# Patient Record
Sex: Male | Born: 1935 | Race: White | Hispanic: No | Marital: Married | Smoking: Former smoker
Health system: Southern US, Community
[De-identification: ages and names within clinical notes are randomized; demographics above are authoritative.]

## PROBLEM LIST (undated history)

## (undated) DIAGNOSIS — I480 Paroxysmal atrial fibrillation: Secondary | ICD-10-CM

## (undated) DIAGNOSIS — I4510 Unspecified right bundle-branch block: Secondary | ICD-10-CM

## (undated) DIAGNOSIS — Z7901 Long term (current) use of anticoagulants: Secondary | ICD-10-CM

## (undated) DIAGNOSIS — I2510 Atherosclerotic heart disease of native coronary artery without angina pectoris: Secondary | ICD-10-CM

## (undated) DIAGNOSIS — E785 Hyperlipidemia, unspecified: Secondary | ICD-10-CM

## (undated) DIAGNOSIS — N183 Chronic kidney disease, stage 3 unspecified: Secondary | ICD-10-CM

## (undated) DIAGNOSIS — B029 Zoster without complications: Secondary | ICD-10-CM

## (undated) DIAGNOSIS — I10 Essential (primary) hypertension: Secondary | ICD-10-CM

## (undated) DIAGNOSIS — E039 Hypothyroidism, unspecified: Secondary | ICD-10-CM

## (undated) DIAGNOSIS — I739 Peripheral vascular disease, unspecified: Secondary | ICD-10-CM

## (undated) HISTORY — DX: Atherosclerotic heart disease of native coronary artery without angina pectoris: I25.10

## (undated) HISTORY — DX: Hyperlipidemia, unspecified: E78.5

## (undated) HISTORY — DX: Hypothyroidism, unspecified: E03.9

## (undated) HISTORY — DX: Chronic kidney disease, stage 3 (moderate): N18.3

## (undated) HISTORY — DX: Essential (primary) hypertension: I10

## (undated) HISTORY — DX: Chronic kidney disease, stage 3 unspecified: N18.30

## (undated) HISTORY — DX: Zoster without complications: B02.9

## (undated) HISTORY — DX: Peripheral vascular disease, unspecified: I73.9

## (undated) HISTORY — DX: Long term (current) use of anticoagulants: Z79.01

## (undated) HISTORY — PX: POPLITEAL ARTERY ANGIOPLASTY: SHX2242

## (undated) HISTORY — DX: Unspecified right bundle-branch block: I45.10

## (undated) HISTORY — DX: Paroxysmal atrial fibrillation: I48.0

---

## 1997-12-09 HISTORY — PX: PROSTATE SURGERY: SHX751

## 2000-12-21 ENCOUNTER — Encounter: Payer: Self-pay | Admitting: Internal Medicine

## 2000-12-21 ENCOUNTER — Ambulatory Visit (HOSPITAL_COMMUNITY): Admission: RE | Admit: 2000-12-21 | Discharge: 2000-12-21 | Payer: Self-pay | Admitting: Internal Medicine

## 2001-07-03 ENCOUNTER — Encounter: Admission: RE | Admit: 2001-07-03 | Discharge: 2001-07-03 | Payer: Self-pay | Admitting: Oncology

## 2001-07-03 ENCOUNTER — Encounter (HOSPITAL_COMMUNITY): Admission: RE | Admit: 2001-07-03 | Discharge: 2001-08-02 | Payer: Self-pay | Admitting: Oncology

## 2001-10-20 ENCOUNTER — Encounter (HOSPITAL_COMMUNITY): Admission: RE | Admit: 2001-10-20 | Discharge: 2001-11-19 | Payer: Self-pay | Admitting: Oncology

## 2001-10-20 ENCOUNTER — Encounter: Admission: RE | Admit: 2001-10-20 | Discharge: 2001-10-20 | Payer: Self-pay | Admitting: Oncology

## 2002-11-02 ENCOUNTER — Encounter (HOSPITAL_COMMUNITY): Admission: RE | Admit: 2002-11-02 | Discharge: 2002-11-08 | Payer: Self-pay | Admitting: Oncology

## 2002-11-02 ENCOUNTER — Encounter: Admission: RE | Admit: 2002-11-02 | Discharge: 2002-11-02 | Payer: Self-pay | Admitting: Oncology

## 2003-05-09 ENCOUNTER — Other Ambulatory Visit: Admission: RE | Admit: 2003-05-09 | Discharge: 2003-05-09 | Payer: Self-pay | Admitting: General Surgery

## 2003-10-21 ENCOUNTER — Ambulatory Visit (HOSPITAL_COMMUNITY): Admission: RE | Admit: 2003-10-21 | Discharge: 2003-10-21 | Payer: Self-pay | Admitting: Family Medicine

## 2003-11-04 ENCOUNTER — Encounter: Admission: RE | Admit: 2003-11-04 | Discharge: 2003-11-08 | Payer: Self-pay | Admitting: Oncology

## 2004-03-10 ENCOUNTER — Ambulatory Visit (HOSPITAL_COMMUNITY): Admission: RE | Admit: 2004-03-10 | Discharge: 2004-03-10 | Payer: Self-pay | Admitting: Family Medicine

## 2004-04-27 ENCOUNTER — Encounter: Admission: RE | Admit: 2004-04-27 | Discharge: 2004-04-27 | Payer: Self-pay | Admitting: Oncology

## 2004-04-27 ENCOUNTER — Ambulatory Visit (HOSPITAL_COMMUNITY): Payer: Self-pay | Admitting: Oncology

## 2004-04-27 ENCOUNTER — Encounter (HOSPITAL_COMMUNITY): Admission: RE | Admit: 2004-04-27 | Discharge: 2004-05-27 | Payer: Self-pay | Admitting: Oncology

## 2004-08-22 ENCOUNTER — Emergency Department (HOSPITAL_COMMUNITY): Admission: EM | Admit: 2004-08-22 | Discharge: 2004-08-22 | Payer: Self-pay | Admitting: Emergency Medicine

## 2007-04-09 HISTORY — PX: HERNIA REPAIR: SHX51

## 2007-04-17 ENCOUNTER — Ambulatory Visit (HOSPITAL_COMMUNITY): Admission: RE | Admit: 2007-04-17 | Discharge: 2007-04-17 | Payer: Self-pay | Admitting: General Surgery

## 2008-02-09 DIAGNOSIS — I2510 Atherosclerotic heart disease of native coronary artery without angina pectoris: Secondary | ICD-10-CM

## 2008-02-09 HISTORY — DX: Atherosclerotic heart disease of native coronary artery without angina pectoris: I25.10

## 2008-02-09 HISTORY — PX: CORONARY ARTERY BYPASS GRAFT: SHX141

## 2008-07-09 ENCOUNTER — Ambulatory Visit (HOSPITAL_COMMUNITY): Admission: RE | Admit: 2008-07-09 | Discharge: 2008-07-09 | Payer: Self-pay | Admitting: Cardiology

## 2008-07-09 HISTORY — PX: CARDIAC CATHETERIZATION: SHX172

## 2008-07-12 ENCOUNTER — Ambulatory Visit: Payer: Self-pay | Admitting: Cardiothoracic Surgery

## 2008-07-12 ENCOUNTER — Inpatient Hospital Stay (HOSPITAL_COMMUNITY): Admission: RE | Admit: 2008-07-12 | Discharge: 2008-07-23 | Payer: Self-pay | Admitting: Cardiology

## 2008-07-13 ENCOUNTER — Encounter (INDEPENDENT_AMBULATORY_CARE_PROVIDER_SITE_OTHER): Payer: Self-pay | Admitting: Cardiology

## 2008-07-14 ENCOUNTER — Encounter: Payer: Self-pay | Admitting: Cardiothoracic Surgery

## 2008-08-09 ENCOUNTER — Encounter: Admission: RE | Admit: 2008-08-09 | Discharge: 2008-08-09 | Payer: Self-pay | Admitting: Cardiothoracic Surgery

## 2008-08-09 ENCOUNTER — Ambulatory Visit: Payer: Self-pay | Admitting: Cardiothoracic Surgery

## 2008-08-23 ENCOUNTER — Encounter: Admission: RE | Admit: 2008-08-23 | Discharge: 2008-08-23 | Payer: Self-pay | Admitting: Cardiothoracic Surgery

## 2008-08-23 ENCOUNTER — Ambulatory Visit: Payer: Self-pay | Admitting: Cardiothoracic Surgery

## 2008-09-12 ENCOUNTER — Encounter: Admission: RE | Admit: 2008-09-12 | Discharge: 2008-09-12 | Payer: Self-pay | Admitting: Cardiothoracic Surgery

## 2008-09-12 ENCOUNTER — Ambulatory Visit: Payer: Self-pay | Admitting: Cardiothoracic Surgery

## 2008-09-30 ENCOUNTER — Encounter (HOSPITAL_COMMUNITY): Admission: RE | Admit: 2008-09-30 | Discharge: 2008-10-30 | Payer: Self-pay | Admitting: Cardiology

## 2008-10-18 ENCOUNTER — Encounter: Admission: RE | Admit: 2008-10-18 | Discharge: 2008-10-18 | Payer: Self-pay | Admitting: Cardiothoracic Surgery

## 2008-10-18 ENCOUNTER — Ambulatory Visit: Payer: Self-pay | Admitting: Cardiothoracic Surgery

## 2008-10-30 ENCOUNTER — Encounter (HOSPITAL_COMMUNITY): Admission: RE | Admit: 2008-10-30 | Discharge: 2008-11-07 | Payer: Self-pay | Admitting: Cardiology

## 2008-11-08 ENCOUNTER — Encounter (HOSPITAL_COMMUNITY): Admission: RE | Admit: 2008-11-08 | Discharge: 2008-12-08 | Payer: Self-pay | Admitting: Cardiology

## 2008-12-09 ENCOUNTER — Encounter (HOSPITAL_COMMUNITY): Admission: RE | Admit: 2008-12-09 | Discharge: 2009-01-08 | Payer: Self-pay | Admitting: Cardiology

## 2009-09-19 ENCOUNTER — Ambulatory Visit (HOSPITAL_COMMUNITY): Admission: RE | Admit: 2009-09-19 | Discharge: 2009-09-19 | Payer: Self-pay | Admitting: Cardiology

## 2009-10-09 HISTORY — PX: RENAL ARTERY ANGIOPLASTY: SHX2317

## 2009-10-16 ENCOUNTER — Ambulatory Visit (HOSPITAL_COMMUNITY): Admission: RE | Admit: 2009-10-16 | Discharge: 2009-10-16 | Payer: Self-pay | Admitting: Cardiovascular Disease

## 2009-12-02 ENCOUNTER — Ambulatory Visit (HOSPITAL_COMMUNITY): Admission: RE | Admit: 2009-12-02 | Discharge: 2009-12-03 | Payer: Self-pay | Admitting: Cardiovascular Disease

## 2009-12-26 DIAGNOSIS — I10 Essential (primary) hypertension: Secondary | ICD-10-CM | POA: Insufficient documentation

## 2009-12-26 HISTORY — DX: Essential (primary) hypertension: I10

## 2010-04-22 LAB — CBC
HCT: 29.7 % — ABNORMAL LOW (ref 39.0–52.0)
HCT: 29.9 % — ABNORMAL LOW (ref 39.0–52.0)
Hemoglobin: 10 g/dL — ABNORMAL LOW (ref 13.0–17.0)
Hemoglobin: 10.2 g/dL — ABNORMAL LOW (ref 13.0–17.0)
MCH: 30.9 pg (ref 26.0–34.0)
MCH: 31.2 pg (ref 26.0–34.0)
MCHC: 33.7 g/dL (ref 30.0–36.0)
MCHC: 34.1 g/dL (ref 30.0–36.0)
MCV: 91.4 fL (ref 78.0–100.0)
MCV: 91.7 fL (ref 78.0–100.0)
Platelets: 103 10*3/uL — ABNORMAL LOW (ref 150–400)
Platelets: 105 K/uL — ABNORMAL LOW (ref 150–400)
RBC: 3.24 MIL/uL — ABNORMAL LOW (ref 4.22–5.81)
RBC: 3.27 MIL/uL — ABNORMAL LOW (ref 4.22–5.81)
RDW: 12.6 % (ref 11.5–15.5)
RDW: 12.6 % (ref 11.5–15.5)
WBC: 7.1 10*3/uL (ref 4.0–10.5)
WBC: 8.2 K/uL (ref 4.0–10.5)

## 2010-04-22 LAB — BASIC METABOLIC PANEL
BUN: 20 mg/dL (ref 6–23)
CO2: 24 mEq/L (ref 19–32)
Calcium: 8.8 mg/dL (ref 8.4–10.5)
Chloride: 107 mEq/L (ref 96–112)
Creatinine, Ser: 1.53 mg/dL — ABNORMAL HIGH (ref 0.4–1.5)
GFR calc Af Amer: 54 mL/min — ABNORMAL LOW (ref >60)
GFR calc non Af Amer: 44 mL/min — ABNORMAL LOW (ref >60)
Glucose, Bld: 181 mg/dL — ABNORMAL HIGH (ref 70–99)
Potassium: 4.4 mEq/L (ref 3.5–5.1)
Sodium: 137 mEq/L (ref 135–145)

## 2010-05-10 DIAGNOSIS — B029 Zoster without complications: Secondary | ICD-10-CM

## 2010-05-10 HISTORY — DX: Zoster without complications: B02.9

## 2010-05-18 LAB — CBC
HCT: 22.8 % — ABNORMAL LOW (ref 39.0–52.0)
HCT: 23.9 % — ABNORMAL LOW (ref 39.0–52.0)
HCT: 24.4 % — ABNORMAL LOW (ref 39.0–52.0)
HCT: 24.4 % — ABNORMAL LOW (ref 39.0–52.0)
HCT: 25.9 % — ABNORMAL LOW (ref 39.0–52.0)
HCT: 26.8 % — ABNORMAL LOW (ref 39.0–52.0)
HCT: 27.8 % — ABNORMAL LOW (ref 39.0–52.0)
HCT: 31.6 % — ABNORMAL LOW (ref 39.0–52.0)
HCT: 32.8 % — ABNORMAL LOW (ref 39.0–52.0)
HCT: 34.3 % — ABNORMAL LOW (ref 39.0–52.0)
HCT: 35.2 % — ABNORMAL LOW (ref 39.0–52.0)
Hemoglobin: 11 g/dL — ABNORMAL LOW (ref 13.0–17.0)
Hemoglobin: 11.4 g/dL — ABNORMAL LOW (ref 13.0–17.0)
Hemoglobin: 12 g/dL — ABNORMAL LOW (ref 13.0–17.0)
Hemoglobin: 12.3 g/dL — ABNORMAL LOW (ref 13.0–17.0)
Hemoglobin: 7.9 g/dL — CL (ref 13.0–17.0)
Hemoglobin: 8.4 g/dL — ABNORMAL LOW (ref 13.0–17.0)
Hemoglobin: 8.5 g/dL — ABNORMAL LOW (ref 13.0–17.0)
Hemoglobin: 8.6 g/dL — ABNORMAL LOW (ref 13.0–17.0)
Hemoglobin: 8.8 g/dL — ABNORMAL LOW (ref 13.0–17.0)
Hemoglobin: 9.4 g/dL — ABNORMAL LOW (ref 13.0–17.0)
Hemoglobin: 9.6 g/dL — ABNORMAL LOW (ref 13.0–17.0)
MCHC: 34.1 g/dL (ref 30.0–36.0)
MCHC: 34.4 g/dL (ref 30.0–36.0)
MCHC: 34.6 g/dL (ref 30.0–36.0)
MCHC: 34.7 g/dL (ref 30.0–36.0)
MCHC: 34.8 g/dL (ref 30.0–36.0)
MCHC: 34.8 g/dL (ref 30.0–36.0)
MCHC: 34.9 g/dL (ref 30.0–36.0)
MCHC: 35 g/dL (ref 30.0–36.0)
MCHC: 35 g/dL (ref 30.0–36.0)
MCHC: 35 g/dL (ref 30.0–36.0)
MCHC: 35 g/dL (ref 30.0–36.0)
MCV: 91.3 fL (ref 78.0–100.0)
MCV: 91.6 fL (ref 78.0–100.0)
MCV: 92.2 fL (ref 78.0–100.0)
MCV: 92.3 fL (ref 78.0–100.0)
MCV: 92.6 fL (ref 78.0–100.0)
MCV: 92.7 fL (ref 78.0–100.0)
MCV: 92.8 fL (ref 78.0–100.0)
MCV: 92.9 fL (ref 78.0–100.0)
MCV: 93.1 fL (ref 78.0–100.0)
MCV: 93.6 fL (ref 78.0–100.0)
MCV: 93.6 fL (ref 78.0–100.0)
Platelets: 100 10*3/uL — ABNORMAL LOW (ref 150–400)
Platelets: 101 10*3/uL — ABNORMAL LOW (ref 150–400)
Platelets: 106 10*3/uL — ABNORMAL LOW (ref 150–400)
Platelets: 108 10*3/uL — ABNORMAL LOW (ref 150–400)
Platelets: 115 10*3/uL — ABNORMAL LOW (ref 150–400)
Platelets: 117 10*3/uL — ABNORMAL LOW (ref 150–400)
Platelets: 119 10*3/uL — ABNORMAL LOW (ref 150–400)
Platelets: 129 10*3/uL — ABNORMAL LOW (ref 150–400)
Platelets: 93 10*3/uL — ABNORMAL LOW (ref 150–400)
Platelets: 94 10*3/uL — ABNORMAL LOW (ref 150–400)
Platelets: 99 10*3/uL — ABNORMAL LOW (ref 150–400)
RBC: 2.44 MIL/uL — ABNORMAL LOW (ref 4.22–5.81)
RBC: 2.58 MIL/uL — ABNORMAL LOW (ref 4.22–5.81)
RBC: 2.64 MIL/uL — ABNORMAL LOW (ref 4.22–5.81)
RBC: 2.67 MIL/uL — ABNORMAL LOW (ref 4.22–5.81)
RBC: 2.79 MIL/uL — ABNORMAL LOW (ref 4.22–5.81)
RBC: 2.92 MIL/uL — ABNORMAL LOW (ref 4.22–5.81)
RBC: 3.01 MIL/uL — ABNORMAL LOW (ref 4.22–5.81)
RBC: 3.38 MIL/uL — ABNORMAL LOW (ref 4.22–5.81)
RBC: 3.53 MIL/uL — ABNORMAL LOW (ref 4.22–5.81)
RBC: 3.7 MIL/uL — ABNORMAL LOW (ref 4.22–5.81)
RBC: 3.78 MIL/uL — ABNORMAL LOW (ref 4.22–5.81)
RDW: 12.3 % (ref 11.5–15.5)
RDW: 12.5 % (ref 11.5–15.5)
RDW: 12.6 % (ref 11.5–15.5)
RDW: 12.9 % (ref 11.5–15.5)
RDW: 13 % (ref 11.5–15.5)
RDW: 13.2 % (ref 11.5–15.5)
RDW: 13.3 % (ref 11.5–15.5)
RDW: 13.4 % (ref 11.5–15.5)
RDW: 13.4 % (ref 11.5–15.5)
RDW: 13.6 % (ref 11.5–15.5)
RDW: 13.8 % (ref 11.5–15.5)
WBC: 10.2 10*3/uL (ref 4.0–10.5)
WBC: 6 10*3/uL (ref 4.0–10.5)
WBC: 6.9 10*3/uL (ref 4.0–10.5)
WBC: 7.3 10*3/uL (ref 4.0–10.5)
WBC: 7.4 10*3/uL (ref 4.0–10.5)
WBC: 7.7 10*3/uL (ref 4.0–10.5)
WBC: 8.4 10*3/uL (ref 4.0–10.5)
WBC: 8.4 10*3/uL (ref 4.0–10.5)
WBC: 9.2 10*3/uL (ref 4.0–10.5)
WBC: 9.3 10*3/uL (ref 4.0–10.5)
WBC: 9.5 10*3/uL (ref 4.0–10.5)

## 2010-05-18 LAB — COMPREHENSIVE METABOLIC PANEL
ALT: 15 U/L (ref 0–53)
ALT: 23 U/L (ref 0–53)
AST: 17 U/L (ref 0–37)
AST: 21 U/L (ref 0–37)
Albumin: 3.2 g/dL — ABNORMAL LOW (ref 3.5–5.2)
Albumin: 3.3 g/dL — ABNORMAL LOW (ref 3.5–5.2)
Alkaline Phosphatase: 54 U/L (ref 39–117)
Alkaline Phosphatase: 61 U/L (ref 39–117)
BUN: 17 mg/dL (ref 6–23)
BUN: 19 mg/dL (ref 6–23)
CO2: 25 mEq/L (ref 19–32)
CO2: 26 mEq/L (ref 19–32)
Calcium: 8.6 mg/dL (ref 8.4–10.5)
Calcium: 8.9 mg/dL (ref 8.4–10.5)
Chloride: 111 mEq/L (ref 96–112)
Chloride: 112 mEq/L (ref 96–112)
Creatinine, Ser: 1.15 mg/dL (ref 0.4–1.5)
Creatinine, Ser: 1.24 mg/dL (ref 0.4–1.5)
GFR calc Af Amer: >60 mL/min (ref >60)
GFR calc Af Amer: >60 mL/min (ref >60)
GFR calc non Af Amer: 57 mL/min — ABNORMAL LOW (ref >60)
GFR calc non Af Amer: >60 mL/min (ref >60)
Glucose, Bld: 115 mg/dL — ABNORMAL HIGH (ref 70–99)
Glucose, Bld: 125 mg/dL — ABNORMAL HIGH (ref 70–99)
Potassium: 4.1 mEq/L (ref 3.5–5.1)
Potassium: 4.7 mEq/L (ref 3.5–5.1)
Sodium: 140 mEq/L (ref 135–145)
Sodium: 141 mEq/L (ref 135–145)
Total Bilirubin: 0.7 mg/dL (ref 0.3–1.2)
Total Bilirubin: 0.8 mg/dL (ref 0.3–1.2)
Total Protein: 5.4 g/dL — ABNORMAL LOW (ref 6.0–8.3)
Total Protein: 5.8 g/dL — ABNORMAL LOW (ref 6.0–8.3)

## 2010-05-18 LAB — CROSSMATCH
ABO/RH(D): A POS
ABO/RH(D): A POS
ABO/RH(D): A POS
Antibody Screen: NEGATIVE
Antibody Screen: NEGATIVE
Antibody Screen: NEGATIVE

## 2010-05-18 LAB — GLUCOSE, CAPILLARY
Glucose-Capillary: 102 mg/dL — ABNORMAL HIGH (ref 70–99)
Glucose-Capillary: 106 mg/dL — ABNORMAL HIGH (ref 70–99)
Glucose-Capillary: 110 mg/dL — ABNORMAL HIGH (ref 70–99)
Glucose-Capillary: 116 mg/dL — ABNORMAL HIGH (ref 70–99)
Glucose-Capillary: 116 mg/dL — ABNORMAL HIGH (ref 70–99)
Glucose-Capillary: 117 mg/dL — ABNORMAL HIGH (ref 70–99)
Glucose-Capillary: 120 mg/dL — ABNORMAL HIGH (ref 70–99)
Glucose-Capillary: 125 mg/dL — ABNORMAL HIGH (ref 70–99)
Glucose-Capillary: 127 mg/dL — ABNORMAL HIGH (ref 70–99)
Glucose-Capillary: 129 mg/dL — ABNORMAL HIGH (ref 70–99)
Glucose-Capillary: 132 mg/dL — ABNORMAL HIGH (ref 70–99)
Glucose-Capillary: 137 mg/dL — ABNORMAL HIGH (ref 70–99)
Glucose-Capillary: 142 mg/dL — ABNORMAL HIGH (ref 70–99)
Glucose-Capillary: 156 mg/dL — ABNORMAL HIGH (ref 70–99)
Glucose-Capillary: 157 mg/dL — ABNORMAL HIGH (ref 70–99)
Glucose-Capillary: 158 mg/dL — ABNORMAL HIGH (ref 70–99)
Glucose-Capillary: 165 mg/dL — ABNORMAL HIGH (ref 70–99)
Glucose-Capillary: 182 mg/dL — ABNORMAL HIGH (ref 70–99)
Glucose-Capillary: 194 mg/dL — ABNORMAL HIGH (ref 70–99)
Glucose-Capillary: 201 mg/dL — ABNORMAL HIGH (ref 70–99)
Glucose-Capillary: 72 mg/dL (ref 70–99)
Glucose-Capillary: 91 mg/dL (ref 70–99)
Glucose-Capillary: 99 mg/dL (ref 70–99)
Glucose-Capillary: 99 mg/dL (ref 70–99)

## 2010-05-18 LAB — POCT I-STAT 3, ART BLOOD GAS (G3+)
Acid-base deficit: 1 mmol/L (ref 0.0–2.0)
Acid-base deficit: 2 mmol/L (ref 0.0–2.0)
Acid-base deficit: 2 mmol/L (ref 0.0–2.0)
Acid-base deficit: 2 mmol/L (ref 0.0–2.0)
Acid-base deficit: 2 mmol/L (ref 0.0–2.0)
Acid-base deficit: 3 mmol/L — ABNORMAL HIGH (ref 0.0–2.0)
Bicarbonate: 22.5 mEq/L (ref 20.0–24.0)
Bicarbonate: 22.9 mEq/L (ref 20.0–24.0)
Bicarbonate: 23.5 mEq/L (ref 20.0–24.0)
Bicarbonate: 23.6 mEq/L (ref 20.0–24.0)
Bicarbonate: 23.7 mEq/L (ref 20.0–24.0)
Bicarbonate: 25 mEq/L — ABNORMAL HIGH (ref 20.0–24.0)
O2 Saturation: 100 %
O2 Saturation: 100 %
O2 Saturation: 94 %
O2 Saturation: 94 %
O2 Saturation: 96 %
O2 Saturation: 96 %
Patient temperature: 35.9
Patient temperature: 36
Patient temperature: 37
Patient temperature: 37
TCO2: 24 mmol/L (ref 0–100)
TCO2: 24 mmol/L (ref 0–100)
TCO2: 25 mmol/L (ref 0–100)
TCO2: 25 mmol/L (ref 0–100)
TCO2: 25 mmol/L (ref 0–100)
TCO2: 26 mmol/L (ref 0–100)
pCO2 arterial: 37.9 mmHg (ref 35.0–45.0)
pCO2 arterial: 39.2 mmHg (ref 35.0–45.0)
pCO2 arterial: 43.2 mmHg (ref 35.0–45.0)
pCO2 arterial: 44 mmHg (ref 35.0–45.0)
pCO2 arterial: 44.2 mmHg (ref 35.0–45.0)
pCO2 arterial: 45.4 mmHg — ABNORMAL HIGH (ref 35.0–45.0)
pH, Arterial: 7.336 — ABNORMAL LOW (ref 7.350–7.450)
pH, Arterial: 7.339 — ABNORMAL LOW (ref 7.350–7.450)
pH, Arterial: 7.344 — ABNORMAL LOW (ref 7.350–7.450)
pH, Arterial: 7.349 — ABNORMAL LOW (ref 7.350–7.450)
pH, Arterial: 7.37 (ref 7.350–7.450)
pH, Arterial: 7.377 (ref 7.350–7.450)
pO2, Arterial: 263 mmHg — ABNORMAL HIGH (ref 80.0–100.0)
pO2, Arterial: 317 mmHg — ABNORMAL HIGH (ref 80.0–100.0)
pO2, Arterial: 70 mmHg — ABNORMAL LOW (ref 80.0–100.0)
pO2, Arterial: 77 mmHg — ABNORMAL LOW (ref 80.0–100.0)
pO2, Arterial: 80 mmHg (ref 80.0–100.0)
pO2, Arterial: 84 mmHg (ref 80.0–100.0)

## 2010-05-18 LAB — HEPARIN INDUCED THROMBOCYTOPENIA PNL
Heparin Induced Plt Ab: NEGATIVE
Patient O.D.: 0.105
Serotonin Release: 2 % release (ref <20)

## 2010-05-18 LAB — POCT I-STAT 4, (NA,K, GLUC, HGB,HCT)
Glucose, Bld: 102 mg/dL — ABNORMAL HIGH (ref 70–99)
Glucose, Bld: 106 mg/dL — ABNORMAL HIGH (ref 70–99)
Glucose, Bld: 108 mg/dL — ABNORMAL HIGH (ref 70–99)
Glucose, Bld: 146 mg/dL — ABNORMAL HIGH (ref 70–99)
Glucose, Bld: 148 mg/dL — ABNORMAL HIGH (ref 70–99)
Glucose, Bld: 94 mg/dL (ref 70–99)
HCT: 21 % — ABNORMAL LOW (ref 39.0–52.0)
HCT: 25 % — ABNORMAL LOW (ref 39.0–52.0)
HCT: 28 % — ABNORMAL LOW (ref 39.0–52.0)
HCT: 29 % — ABNORMAL LOW (ref 39.0–52.0)
HCT: 32 % — ABNORMAL LOW (ref 39.0–52.0)
HCT: 32 % — ABNORMAL LOW (ref 39.0–52.0)
Hemoglobin: 10.9 g/dL — ABNORMAL LOW (ref 13.0–17.0)
Hemoglobin: 10.9 g/dL — ABNORMAL LOW (ref 13.0–17.0)
Hemoglobin: 7.1 g/dL — CL (ref 13.0–17.0)
Hemoglobin: 8.5 g/dL — ABNORMAL LOW (ref 13.0–17.0)
Hemoglobin: 9.5 g/dL — ABNORMAL LOW (ref 13.0–17.0)
Hemoglobin: 9.9 g/dL — ABNORMAL LOW (ref 13.0–17.0)
Potassium: 4 mEq/L (ref 3.5–5.1)
Potassium: 4.6 mEq/L (ref 3.5–5.1)
Potassium: 4.7 mEq/L (ref 3.5–5.1)
Potassium: 4.8 mEq/L (ref 3.5–5.1)
Potassium: 5 mEq/L (ref 3.5–5.1)
Potassium: 5.9 mEq/L — ABNORMAL HIGH (ref 3.5–5.1)
Sodium: 135 mEq/L (ref 135–145)
Sodium: 137 mEq/L (ref 135–145)
Sodium: 138 mEq/L (ref 135–145)
Sodium: 139 mEq/L (ref 135–145)
Sodium: 140 mEq/L (ref 135–145)
Sodium: 142 mEq/L (ref 135–145)

## 2010-05-18 LAB — ABO/RH: ABO/RH(D): A POS

## 2010-05-18 LAB — BASIC METABOLIC PANEL
BUN: 18 mg/dL (ref 6–23)
BUN: 19 mg/dL (ref 6–23)
BUN: 19 mg/dL (ref 6–23)
BUN: 21 mg/dL (ref 6–23)
BUN: 26 mg/dL — ABNORMAL HIGH (ref 6–23)
BUN: 27 mg/dL — ABNORMAL HIGH (ref 6–23)
CO2: 24 mEq/L (ref 19–32)
CO2: 25 mEq/L (ref 19–32)
CO2: 26 mEq/L (ref 19–32)
CO2: 27 mEq/L (ref 19–32)
CO2: 28 mEq/L (ref 19–32)
CO2: 28 mEq/L (ref 19–32)
Calcium: 8 mg/dL — ABNORMAL LOW (ref 8.4–10.5)
Calcium: 8 mg/dL — ABNORMAL LOW (ref 8.4–10.5)
Calcium: 8 mg/dL — ABNORMAL LOW (ref 8.4–10.5)
Calcium: 8.1 mg/dL — ABNORMAL LOW (ref 8.4–10.5)
Calcium: 8.2 mg/dL — ABNORMAL LOW (ref 8.4–10.5)
Calcium: 8.5 mg/dL (ref 8.4–10.5)
Chloride: 102 mEq/L (ref 96–112)
Chloride: 103 mEq/L (ref 96–112)
Chloride: 105 mEq/L (ref 96–112)
Chloride: 108 mEq/L (ref 96–112)
Chloride: 110 mEq/L (ref 96–112)
Chloride: 111 mEq/L (ref 96–112)
Creatinine, Ser: 1.04 mg/dL (ref 0.4–1.5)
Creatinine, Ser: 1.12 mg/dL (ref 0.4–1.5)
Creatinine, Ser: 1.15 mg/dL (ref 0.4–1.5)
Creatinine, Ser: 1.21 mg/dL (ref 0.4–1.5)
Creatinine, Ser: 1.22 mg/dL (ref 0.4–1.5)
Creatinine, Ser: 1.25 mg/dL (ref 0.4–1.5)
GFR calc Af Amer: >60 mL/min (ref >60)
GFR calc Af Amer: >60 mL/min (ref >60)
GFR calc Af Amer: >60 mL/min (ref >60)
GFR calc Af Amer: >60 mL/min (ref >60)
GFR calc Af Amer: >60 mL/min (ref >60)
GFR calc Af Amer: >60 mL/min (ref >60)
GFR calc non Af Amer: 56 mL/min — ABNORMAL LOW (ref >60)
GFR calc non Af Amer: 58 mL/min — ABNORMAL LOW (ref >60)
GFR calc non Af Amer: 58 mL/min — ABNORMAL LOW (ref >60)
GFR calc non Af Amer: >60 mL/min (ref >60)
GFR calc non Af Amer: >60 mL/min (ref >60)
GFR calc non Af Amer: >60 mL/min (ref >60)
Glucose, Bld: 107 mg/dL — ABNORMAL HIGH (ref 70–99)
Glucose, Bld: 118 mg/dL — ABNORMAL HIGH (ref 70–99)
Glucose, Bld: 127 mg/dL — ABNORMAL HIGH (ref 70–99)
Glucose, Bld: 139 mg/dL — ABNORMAL HIGH (ref 70–99)
Glucose, Bld: 147 mg/dL — ABNORMAL HIGH (ref 70–99)
Glucose, Bld: 153 mg/dL — ABNORMAL HIGH (ref 70–99)
Potassium: 3.8 mEq/L (ref 3.5–5.1)
Potassium: 3.8 mEq/L (ref 3.5–5.1)
Potassium: 4.1 mEq/L (ref 3.5–5.1)
Potassium: 4.3 mEq/L (ref 3.5–5.1)
Potassium: 4.5 mEq/L (ref 3.5–5.1)
Potassium: 4.5 mEq/L (ref 3.5–5.1)
Sodium: 135 mEq/L (ref 135–145)
Sodium: 135 mEq/L (ref 135–145)
Sodium: 136 mEq/L (ref 135–145)
Sodium: 136 mEq/L (ref 135–145)
Sodium: 140 mEq/L (ref 135–145)
Sodium: 141 mEq/L (ref 135–145)

## 2010-05-18 LAB — PREPARE FRESH FROZEN PLASMA

## 2010-05-18 LAB — POCT I-STAT, CHEM 8
BUN: 18 mg/dL (ref 6–23)
BUN: 24 mg/dL — ABNORMAL HIGH (ref 6–23)
Calcium, Ion: 1.15 mmol/L (ref 1.12–1.32)
Calcium, Ion: 1.19 mmol/L (ref 1.12–1.32)
Chloride: 102 mEq/L (ref 96–112)
Chloride: 106 mEq/L (ref 96–112)
Creatinine, Ser: 1.1 mg/dL (ref 0.4–1.5)
Creatinine, Ser: 1.4 mg/dL (ref 0.4–1.5)
Glucose, Bld: 132 mg/dL — ABNORMAL HIGH (ref 70–99)
Glucose, Bld: 174 mg/dL — ABNORMAL HIGH (ref 70–99)
HCT: 23 % — ABNORMAL LOW (ref 39.0–52.0)
HCT: 26 % — ABNORMAL LOW (ref 39.0–52.0)
Hemoglobin: 7.8 g/dL — CL (ref 13.0–17.0)
Hemoglobin: 8.8 g/dL — ABNORMAL LOW (ref 13.0–17.0)
Potassium: 4 mEq/L (ref 3.5–5.1)
Potassium: 4.9 mEq/L (ref 3.5–5.1)
Sodium: 139 mEq/L (ref 135–145)
Sodium: 140 mEq/L (ref 135–145)
TCO2: 24 mmol/L (ref 0–100)
TCO2: 26 mmol/L (ref 0–100)

## 2010-05-18 LAB — URINALYSIS, ROUTINE W REFLEX MICROSCOPIC
Bilirubin Urine: NEGATIVE
Glucose, UA: NEGATIVE mg/dL
Hgb urine dipstick: NEGATIVE
Ketones, ur: NEGATIVE mg/dL
Leukocytes, UA: NEGATIVE
Nitrite: POSITIVE — AB
Protein, ur: NEGATIVE mg/dL
Specific Gravity, Urine: 1.014 (ref 1.005–1.030)
Urobilinogen, UA: 1 mg/dL (ref 0.0–1.0)
pH: 6 (ref 5.0–8.0)

## 2010-05-18 LAB — PROTIME-INR
INR: 1.1 (ref 0.00–1.49)
INR: 1.6 — ABNORMAL HIGH (ref 0.00–1.49)
Prothrombin Time: 14.6 seconds (ref 11.6–15.2)
Prothrombin Time: 19.5 seconds — ABNORMAL HIGH (ref 11.6–15.2)

## 2010-05-18 LAB — BLOOD GAS, ARTERIAL
Acid-base deficit: 0.9 mmol/L (ref 0.0–2.0)
Acid-base deficit: 1.6 mmol/L (ref 0.0–2.0)
Bicarbonate: 22.3 mEq/L (ref 20.0–24.0)
Bicarbonate: 22.9 mEq/L (ref 20.0–24.0)
Drawn by: 290241
FIO2: 0.21 %
O2 Saturation: 93.9 %
O2 Saturation: 95 %
Patient temperature: 98.6
Patient temperature: 98.7
TCO2: 23.4 mmol/L (ref 0–100)
TCO2: 23.9 mmol/L (ref 0–100)
pCO2 arterial: 35 mmHg (ref 35.0–45.0)
pCO2 arterial: 35.5 mmHg (ref 35.0–45.0)
pH, Arterial: 7.414 (ref 7.350–7.450)
pH, Arterial: 7.43 (ref 7.350–7.450)
pO2, Arterial: 67.3 mmHg — ABNORMAL LOW (ref 80.0–100.0)
pO2, Arterial: 69.9 mmHg — ABNORMAL LOW (ref 80.0–100.0)

## 2010-05-18 LAB — TSH
TSH: 0.082 u[IU]/mL — ABNORMAL LOW (ref 0.350–4.500)
TSH: 0.268 u[IU]/mL — ABNORMAL LOW (ref 0.350–4.500)

## 2010-05-18 LAB — URINE MICROSCOPIC-ADD ON

## 2010-05-18 LAB — CREATININE, SERUM
Creatinine, Ser: 0.99 mg/dL (ref 0.4–1.5)
Creatinine, Ser: 1.25 mg/dL (ref 0.4–1.5)
GFR calc Af Amer: >60 mL/min (ref >60)
GFR calc Af Amer: >60 mL/min (ref >60)
GFR calc non Af Amer: 56 mL/min — ABNORMAL LOW (ref >60)
GFR calc non Af Amer: >60 mL/min (ref >60)

## 2010-05-18 LAB — APTT
aPTT: 32 seconds (ref 24–37)
aPTT: 34 seconds (ref 24–37)

## 2010-05-18 LAB — MAGNESIUM
Magnesium: 2.5 mg/dL (ref 1.5–2.5)
Magnesium: 2.5 mg/dL (ref 1.5–2.5)
Magnesium: 2.9 mg/dL — ABNORMAL HIGH (ref 1.5–2.5)

## 2010-05-18 LAB — PREPARE PLATELETS

## 2010-05-18 LAB — HEMOGLOBIN A1C
Hgb A1c MFr Bld: 5.9 % (ref 4.6–6.1)
Mean Plasma Glucose: 123 mg/dL

## 2010-05-18 LAB — PLATELET COUNT
Platelets: 113 10*3/uL — ABNORMAL LOW (ref 150–400)
Platelets: 138 10*3/uL — ABNORMAL LOW (ref 150–400)
Platelets: 82 10*3/uL — ABNORMAL LOW (ref 150–400)

## 2010-05-18 LAB — POCT I-STAT GLUCOSE
Glucose, Bld: 155 mg/dL — ABNORMAL HIGH (ref 70–99)
Operator id: 3402

## 2010-05-18 LAB — HEPARIN ANTIBODY SCREEN: Heparin Antibody Screen: NEGATIVE

## 2010-05-18 LAB — HEMOGLOBIN AND HEMATOCRIT, BLOOD
HCT: 26.7 % — ABNORMAL LOW (ref 39.0–52.0)
Hemoglobin: 9.1 g/dL — ABNORMAL LOW (ref 13.0–17.0)

## 2010-05-18 LAB — HEPARIN LEVEL (UNFRACTIONATED)
Heparin Unfractionated: 0.5 IU/mL (ref 0.30–0.70)
Heparin Unfractionated: <0.1 IU/mL — ABNORMAL LOW (ref 0.30–0.70)

## 2010-05-18 LAB — T3, FREE: T3, Free: 2.5 pg/mL (ref 2.3–4.2)

## 2010-06-23 NOTE — Discharge Summary (Signed)
NAMECARNEL, Scott [name] NO.:  [id number]   MEDICAL RECORD NO.:  [id number]          PATIENT TYPE:  INP   LOCATION:  2005                         FACILITY:  [facility]   PHYSICIAN:  Kerin Perna, M.D.  DATE OF BIRTH:  1929/07/21   DATE OF ADMISSION:  07/12/2008  DATE OF DISCHARGE:                               DISCHARGE SUMMARY   PRIMARY ADMITTING DIAGNOSIS:  Chest pain.   ADDITIONAL/DISCHARGE DIAGNOSES:  1. Severe three-vessel coronary artery disease.  2. Unstable angina.  3. Hypertension.  4. Hypothyroidism.  5. History of tobacco abuse.  6. Chronic thrombocytopenia.  7. 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  8. Paroxysmal atrial fibrillation.  9. Postoperative acute blood loss anemia.   PROCEDURES PERFORMED:  1. Cardiac catheterization.  2. Coronary artery bypass grafting x4 (left internal mammary artery to      the LAD, saphenous vein graft to the obtuse marginal, saphenous      vein graft to the diagonal, saphenous vein graft to the right      coronary artery.  3. Open saphenous vein harvest, left leg.   HISTORY:  The patient is a 77 year old male who recently has experienced  progressive exertional chest pain and shortness of breath.  He was  initially seen by his primary care physician, Dr. Regino Schultze, and  subsequently was referred to Dr. Garen Lah of [facility] for further workup.  He underwent a stress test which was  consistent with ischemia, especially in the inferior wall.  Ejection  fraction was measured at 55%.  A 2-D echocardiogram showed no  significant valvular disease with an EF of 60% and no evidence of  elevated right-sided pressures or pulmonary hypertension.  Because of  these findings and his ongoing symptoms, it was felt that he should  undergo cardiac catheterization.  He was brought into [facility] on the date of this admission for diagnostic catheterization.  This demonstrated a left  ventricular ejection fraction of 55% with  severe three-vessel coronary artery disease including an 85% stenosis of  the LAD and diagonal, 70-80% stenosis of an ulcerated plaque in the  circumflex, and a totally occluded right which reconstituted via  collaterals to the PD.  He was not felt to be a good candidate for  percutaneous intervention, but it was felt that he would benefit from a  cardiac surgery consult.  He was subsequently admitted to the coronary  care unit at [facility] for further evaluation and treatment.   HOSPITAL COURSE:  The patient was started on heparin postcatheterization  and was seen by Dr. Kathlee Nations Trigt of [facility] for consideration of surgical revascularization.  Dr. Donata Clay  reviewed his films and agreed that he would benefit from CABG.  He  explained all risks, benefits, and alternatives of surgery to the  patient and he agreed to proceed.  His preoperative workup included  carotid Doppler studies which showed a 40-59% right ICA stenosis with an  occluded left ICA.  Because of this finding, he also underwent a  CT  angiogram of the head which confirmed an occluded left ICA with no  string sign.  There was stenosis of the right subclavian artery and some  calcification of the aorta on CT scan.  He also had paroxysmal atrial  fibrillation following his catheterization and was started on  amiodarone.  He did remain in normal sinus rhythm prior to surgery on  amio.  He otherwise remained stable and pain free prior to surgery.  In  light of his previous history of chronic thrombocytopenia, he underwent  a heparin-induced thrombocytopenia panel which was negative.  He was  taken to the operating room on July 18, 2008, and underwent CABG x4 as  described above.  He tolerated the procedure well and was transferred to  the SICU in stable condition.  He was extubated shortly after surgery.  He was hemodynamically stable and doing  well on postop day #1.  His  chest tubes and hemodynamic monitoring devices were removed, and he was  kept in the unit for further observation.  He did have a recurrence of  atrial fibrillation with accelerated heart rate and was started on IV  amiodarone.  He remained in the unit for further monitoring, and by  postop day 3 had maintained sinus rhythm for greater than 24 hours.  He  was switched to p.o. amiodarone and was able to be transferred to the  step-down unit.  He did require a transfusion of packed red blood cells  for an acute blood loss anemia.  He was subsequently started on iron  supplementation.  His postoperative course has been relatively  uneventful.  He has had some wheezing and productive cough and has been  treated with Xopenex nebulizer treatments, Advair, and Mucinex.  He  currently is off supplemental oxygen with O2 sats of greater than 90% on  room air.  He also has had a mild volume overload and was started on  Lasix to which he is responding well.  He remains approximately 2 kg  above his preoperative weight.  His incisions are all healing well.  His  blood counts have improved after transfusion.  His current labs on  postop day #4 show hemoglobin of 9.4, hematocrit 26.8, platelets 115,  white count 7.4, sodium 135, potassium 4.3, BUN 27, creatinine 1.22.  He  is maintaining normal sinus rhythm with heart rates in the 60s and is  otherwise afebrile and his vital signs are stable.  He will continue to  be observed over the next 24 hours, and if he continues to remain in  sinus rhythm he will hopefully be ready for discharge home on July 23, 2008.  Also of note the patient's admission labs, his TSH was low at  0.082 and his levothyroxine dose was decreased to 88 mcg daily.  This  will need to be followed up as an outpatient.   DISCHARGE MEDICATIONS:  1. Lexapro 10 mg daily.  2. Aspirin 81 mg daily.  3. Crestor 10 mg daily.  4. Levothyroxine 88 mcg daily.  5.  Amiodarone 400 mg b.i.d. x 1 week, then 200 mg b.i.d.  6. Lopressor 12.5 mg t.i.d.  7. Nu-Iron 150 mg daily.  8. Advair 250/50 one puff b.i.d.  9. Lasix 40 mg daily x1 week.  10.Potassium 20 mEq daily x1 week.  11.Nicoderm CQ 7 mg patch daily.  12.Ultram 50-100 mg q.4 h. p.r.n. for pain.   DISCHARGE INSTRUCTIONS:  He was asked to refrain from driving, heavy  lifting,  or strenuous activity.  He may continue ambulating daily and  using his incentive spirometer.  He may shower daily and clean his  incisions with soap and water.  He may continue a low-fat, low-sodium  diet.   DISCHARGE FOLLOWUP:  He will need to make an appointment to see Dr.  Garen Lah in 2 weeks for followup.  He will then see Dr. Donata Clay on  August 09, 2008, with a chest x-ray from [facility].  If he  experiences any further problems or has questions in the interim, he is  asked to contact our office immediately.      Coral Ceo, P.A.      Kerin Perna, M.D.  Electronically Signed    GC/MEDQ  D:  07/22/2008  T:  07/23/2008  Job:  [id number]   cc:   Sheliah Mends, MD  Kirk Ruths, M.D.

## 2010-06-23 NOTE — Procedures (Signed)
NAMEFINLEE, Scott [name] NO.:  [id number]   MEDICAL RECORD NO.:  [id number]          PATIENT TYPE:  OIB   LOCATION:  2507                         FACILITY:  [facility]   PHYSICIAN:  Nanetta Batty, M.D.   DATE OF BIRTH:  03-03-1929   DATE OF PROCEDURE:  12/02/2009  DATE OF DISCHARGE:                    PERIPHERAL VASCULAR INVASIVE PROCEDURE    Peripheral angiogram/SilverHawk/TurboHawk atherectomy/left renal artery  PTA and stent procedure.   Mr. Caravello is a very pleasant 77 year old married Caucasian male, father  of 2, patient of Dr. Sheliah Mends.  He has a history of CAD status post  bypass grafting in June 2010.  His other problems include hypertension,  dyslipidemia, PAF without recurrence in claudication.  Dopplers in our  office revealed ABIs in the 0.9-0.1 range, but he appeared to have a  high-frequency signal in the left SFA.  He has known occluded left  internal carotid with mild right ICA stenosis.  Angiogram done on  October 16, 2009, revealing a 90% ostial left renal artery stenosis.  He also had 95% popliteal stenosis on the right with three-vessel runoff  and 50% to 60% segmental mid left SFA disease.  He has lifestyle-  limiting claudication.  Because of the fairly focal nature of his right  popliteal lesion and the fact that it was in the popliteal space (no  stents), he was brought back today electively for TurboHawk atherectomy  of the right popliteal lesion as well as left renal artery PTA and  stenting.  He does have hypertension, moderate renal insufficiency, and  renal Dopplers performed in our office that showed a left renal aortic  ratio of 5.2.   PROCEDURE DESCRIPTION:  The patient was brought to the second floor of  [facility] in the postabsorptive state.  He was  premedicated with p.o. Valium.  His right groin was prepped and shaved  in the usual sterile fashion.  Xylocaine 1% was used for local  anesthesia.  A  7-French crossover sheath was inserted into the left  femoral artery using standard Seldinger technique.  The patient received  5000 units of heparin intravenously with an ending ACT of 237.  Using a  5-French crossover catheter and Versacore wire, contralateral access was  obtained.  The Versacore was then advanced down the SFA to the level of  the popliteal stenosis and a 0.035 long Quick-Cross catheter was used to  exchange for a 300-cm length 0.014 Spartacore wire which then was used  to cross the lesion in the right popliteal artery.  Following this,  using a TurboHawk atherectomy device, multiple cuts were performed in 90-  degree angles (5 cuts), resulting in reduction of a 90% fairly  concentric right popliteal stenosis less than 10%.  There was a  significant amount of fibrous plaque removed from the nose.  Following  this, the sheath was then withdrawn across the bifurcation and exchanged  for a short 7-French sheath.  Using a 7-French short JR-4 guide catheter  along with a 0.014 Spartacore wire, predilatation was performed on the  left renal artery with a 4- x 15-mm  long Aviator balloon.  This was then  stented with a 5 x 15 Genesis on Aviator balloon stent premount  resulting in reduction of an 80% to 90% proximal left renal artery  stenosis to 0% residual.  The patient tolerated the procedure well.  Total of 110 mL of contrast was used during the case.  The guidewire and  catheter were removed,  and the patient left the lab in stable condition.  The sheath will be  removed once ACT falls below 170.  The patient will be hydrated  overnight.  Continue with Aspirin, Plavix, and discharge to home in the  morning, get followup renal and extremity Dopplers and will see me back  in the office in followup.  Left the lab in stable condition.      Nanetta Batty, M.D.      Cordelia Pen  D:  12/02/2009  T:  12/03/2009  Job:  [id number]   cc:   [facility]   [facility]  Dr. Fara Boros.   Electronically Signed by Nanetta Batty M.D. on 12/11/2009 02:55:21 PM

## 2010-06-23 NOTE — Assessment & Plan Note (Signed)
OFFICE VISIT   [name], [name]  DOB:  1929/09/14                                        August 09, 2008  CHART #:  [id number]   CURRENT PROBLEMS:  1. Status post coronary artery bypass graft x4, July 18, 2008, for      class IV unstable angina with left main and three-vessel disease.  2. Chronic obstructive pulmonary disease, reformed smoker.  3. History of thrombocytopenia.  4. Hypothyroidism.   PRESENT ILLNESS:  The patient is a very nice 77 year old gentleman who  presents for his first office visit after multivessel bypass grafting in  early June for unstable angina.  He presented with accelerating symptoms  of angina and had an echo showing EF of 55%.  He was brought in for  cardiac cath which demonstrated an 85% stenosis in the LAD diagonal, 80%  stenosis with an ulcerated plaque in the circumflex, and an occluded  right dominant coronary.  He underwent left IMA grafting to the LAD and  vein graft to the diagonal, circumflex marginal, and distal RCA.  His  vein was harvested with an open technique on the left leg since the leg  was very thin and adherent to the skin.  Since returning home, he has  experienced no recurrent chest pain.  The surgical incisions are healing  well.  He has had some shortness of breath, and his overall strength has  gradually improved.  He continues on his discharge medications which  include Lexapro, aspirin, Crestor, levothyroxine, Lopressor 12.5 b.i.d.,  iron, Advair Diskus, and amiodarone 200 mg daily.  He is not taking any  pain medicine.   PHYSICAL EXAMINATION:  His blood pressure is 130/70, pulse is 58,  respirations 18, saturation 95%.  He is alert and oriented.  Breath  sounds are clear except for the left lung base which is diminished.  Cardiac rhythm is regular.  The sternal incision is well healed.  Left  leg incision is well healed.  There is minimal ankle edema.   IMAGING:  A PA and lateral chest x-ray shows a  left moderate pleural  effusion.   PROCEDURE:  The patient was prepared for a left thoracentesis in the  office.  Over 1000 mL of serosanguineous fluid was removed.  The patient  will be started on oral Lasix course of 10 days 40 mg daily and return  with a chest x-ray for followup of left pleural effusion in 2 weeks.  He  knows he can start driving and doing light activities but not to lift  anything heavier than 15 pounds until September.   Kerin Perna, M.D.  Electronically Signed   PV/MEDQ  D:  08/09/2008  T:  08/10/2008  Job:  [id number]   cc:   Sheliah Mends, MD

## 2010-06-23 NOTE — Op Note (Signed)
Scott [name], BARRIS NO.:  [id number]   MEDICAL RECORD NO.:  [id number]          PATIENT TYPE:  INP   LOCATION:  2304                         FACILITY:  [facility]   PHYSICIAN:  Kerin Perna, M.D.  DATE OF BIRTH:  Apr 07, 1929   DATE OF PROCEDURE:  07/18/2008  DATE OF DISCHARGE:                               OPERATIVE REPORT   OPERATIONS:  1. Coronary artery bypass grafting x4 (left internal mammary artery      left anterior descending, saphenous vein graft to first diagonal,      saphenous vein graft to distal circumflex marginal, and saphenous      vein graft to distal right coronary artery).  2. Endoscopic harvest attempted, bilateral leg veins.  However, the      veins were dissected and examined, but too small to use.   SURGEON:  Kerin Perna, MD   ASSISTANT:  Rowe Clack, PA-C   ANESTHESIA:  General by Dr. Jairo Ben.   PREOPERATIVE DIAGNOSIS:  Class IV unstable angina with moderate left  main and severe 3-vessel coronary artery disease.   POSTOPERATIVE DIAGNOSIS:  Class IV unstable angina with moderate left  main and severe 3-vessel coronary artery disease.   INDICATIONS:  The patient is a 77 year old Caucasian smoker who  presented with unstable angina and had mild elevation of cardiac  enzymes.  Cardiac catheterization and 2-D echo showed severe multivessel  coronary artery disease with chronic occlusion of the right coronary and  high-grade stenosis of the LAD and circumflex vessels from the left  coronary.  The left main had a 40% stenosis and EF was 50%.  A 2-D echo  showed no significant valvular abnormalities.  The patient did have  document during this hospitalization, the total occlusion of left  internal carotid artery, moderate stenosis of the right subclavian  artery, and baseline thrombocytopenia with a platelet count of 100,000.  He was felt to be a candidate for surgical coronary vascularization.  I  reviewed the results of  cardiac cath with the patient and family, and  discussed the indications and expected benefits of coronary artery  bypass surgery for treatment of severe coronary artery disease.  I  reviewed the alternatives to surgical therapy as well.  I discussed with  the patient the risks to him of coronary bypass surgery for treatment of  his coronary artery disease including risks of stroke, bleeding, blood  transfusion requirement, infection, MI, and death.  After reviewing  these issues, he demonstrated his understanding and agreed to proceed  with surgery under what I felt was an informed consent.   OPERATIVE FINDINGS:  The vein was exposed bilaterally with endoscopic  techniques, however, was too small to use in the thigh and was taken as  an open technique from the left lower leg.  The vein was somewhat small,  but adequate conduit.  The mammary artery had an excellent flow.  The  patient had platelet count of 80,000, at the termination of bypass and  was given platelet transfusion.  The right coronary target was a  suboptimal target.  The distal circumflex was diffusely diseased, but  adequate target.  The LAD was a good target.   PROCEDURE:  The patient was brought to the operating room and placed  supine on the operating room table where general anesthesia was induced.  The chest, abdomen, and legs were prepped with Betadine and draped as a  sterile field.  A sternal incision was made as the saphenous vein was  harvested endoscopically from both legs, but was not removed due to its  small size.  Instead, the vein was harvested with an open technique from  the left lower leg.  The left internal mammary artery was harvested as a  pedicle graft from its origin at the subclavian vessels.  It is a good  vessel with excellent flow.  The sternal retractor was placed and  pericardium was opened and suspended.  Purse-strings were placed in the  ascending aorta and right atrium, and after the vein  had been harvested,  inspected, and found to be adequate, the patient was heparinized and the  ACT was therapeutic.  The patient was then cannulated and placed on  cardiopulmonary bypass.  The coronaries were identified for grafting and  the mammary artery and vein grafts were prepared for the distal  anastomoses.  Cardioplegia catheters were placed for both antegrade and  retrograde cold blood cardioplegia and the patient was cooled to 32  degrees.  Aortic crossclamp was applied and 800 mL of cold blood  cardioplegia was delivered in split doses between the antegrade aortic  and retrograde coronary sinus catheters.  There is good cardioplegic  arrest and septal temperature dropped to less than 12 degrees.  Cardioplegia was delivered every 20 minutes or less while the crossclamp  was in place.   The distal coronary anastomoses were then performed.  The first distal  anastomosis was to the distal RCA.  This was occluded chronically.  It  was a small 1.2-mm vessel, but a 1-mm probe passed distally.  Reverse  saphenous vein was sewn end-to-side with running 7-0 Prolene with good  flow through the graft.  The second distal anastomosis was the distal  circumflex coronary branch of the left coronary.  There is a 1.5-mm  vessel proximally diffuse disease and a tight proximal stenosis of 80%.  A reverse saphenous vein was sewn end-to-side with running 7-0 Prolene  with good flow through the graft.  Third distal anastomosis was to the  first diagonal branch of the LAD.  There was a 1.2-mm vessel with  proximal 80% stenosis.  A reverse saphenous vein was sewn end-to-side  with running 7-0 Prolene with good flow through the graft.  Cardioplegia  was redosed.  The fourth distal anastomosis was placed through the  distal LAD.  It was a 1.5-mm vessel, proximal 90% stenosis.  The left  IMA pedicle was brought through an opening created in the left lateral  pericardium, was brought down on the LAD and  sewn end-to-side with  running 8-0 Prolene.  There was good flow through the anastomosis after  briefly releasing the pedicle bulldog and the mammary artery.  The  bulldog was reapplied and the pedicle was secured to the epicardium with  two 6-0 Prolene sutures.  Cardioplegia was redosed.   While the crossclamp was still in place 3 proximal vein anastomoses were  performed on the ascending aorta using a 4.0-mm punch running 7-0  Prolene.  Prior to tying down the final proximal anastomosis, air was  vented from the coronaries  with a dose of retrograde warm blood  cardioplegia.  The crossclamp was then removed.   The heart resumed its spontaneous rhythm.  Bypasses were opened after  air was aspirated with a 27 gauge needle, in which the graft had good  flow.  Hemostasis was documented at the proximal and distal sites.  The  patient was rewarmed to 37 degrees.  Temporary pacing wires were  applied.  The lungs re-expanded and the ventilator was resumed.  When  the patient was rewarmed and reperfuse, he was weaned from bypass  without difficulty.  Cardiac output and blood pressure were stable.  Protamine was administered without adverse reaction.  The cannulas were  removed.  The patient remained stable.  The mediastinum was irrigated  with warm antibiotic irrigation.  The leg incision was irrigated and  closed in a standard fashion.  The superior pericardial fat was closed  over the aorta.  Two  mediastinal and left pleural chest tube were placed and brought out  through separate incisions.  The sternum was closed with interrupted  steel wire.  The pectoralis fascia was closed with a running #1 Vicryl.  Subcutaneous and skin layers closed in running Vicryl and sterile  dressings were applied.  Total bypass time was 140 minutes.      Kerin Perna, M.D.  Electronically Signed     PV/MEDQ  D:  07/18/2008  T:  07/19/2008  Job:  [id number]   cc:   Sheliah Mends, MD

## 2010-06-23 NOTE — Assessment & Plan Note (Signed)
OFFICE VISIT   Scott [name], Scott [name]  DOB:  1929/06/05                                        August 23, 2008  CHART #:  [id number]   CURRENT PROBLEMS:  1. Recurrent left pleural effusion, status post coronary artery bypass      graft x4 July 18, 2008.  2. History of chronic obstructive pulmonary disease with chronic      smoking history.   PRESENT ILLNESS:  The patient returns for followup with a chest x-ray.  He had a thoracentesis 2 weeks ago at which time a liter was drained.  He was placed on Lasix 40 mg a day and he has stopped smoking.  He has  had some congestion and mild shortness of breath, but he is not  symptomatic with the effusion as he was previously.  Otherwise, his  surgical incisions are healing well.  He has no recurrent angina or  fever.   PHYSICAL EXAMINATION:  VITAL SIGNS:  Blood pressure is 160/70, pulse 55,  respirations 18, and saturation 96%.  GENERAL:  He is alert and pleasant.  LUNGS:  Breath sounds are diminished at the left base.  CARDIAC:  Rhythm is regular.  SKIN:  Surgical incisions are healing well.  EXTREMITIES:  There is no significant peripheral edema.   PA and lateral chest x-ray shows recurrence of a left moderate pleural  effusion.   PROCEDURE:  After informed consent, the left chest was prepped and  draped posteriorly and 1% lidocaine was infiltrated and a left  thoracentesis was performed draining 1200 mL of xanthochromic clear  fluid.  A followup chest x-ray showed no pneumothorax and no residual  pleural effusion.   PLAN:  The patient will continue his rehab activities, driving, and  light activities, but avoid heavy lifting.  I will give him a prednisone  taper over 8 days.  He is below his preop weight and I see no need to  push diuresis.  He will continue his incentive spirometer, remain off  cigarettes and I will see him back in approximately 10-14 days with a  followup chest x-ray.   Kerin Perna,  M.D.  Electronically Signed   PV/MEDQ  D:  08/23/2008  T:  08/24/2008  Job:  [id number]   cc:   Kirk Ruths, M.D.  Sheliah Mends, MD

## 2010-06-23 NOTE — H&P (Signed)
NAME:  Scott [name], Scott [name]                 ACCOUNT NO.:  [id number]   MEDICAL RECORD NO.:  [id number]          PATIENT TYPE:  AMB   LOCATION:  DAY                           FACILITY:  [facility]   PHYSICIAN:  Dalia Heading, M.D.  DATE OF BIRTH:  August 25, 1929   DATE OF ADMISSION:  DATE OF DISCHARGE:  LH                              HISTORY & PHYSICAL   CHIEF COMPLAINT:  Right inguinal hernia.   HISTORY OF PRESENT ILLNESS:  The patient is a 77 year old white male who  was referred for evaluation and treatment of right inguinal hernia.  It  has been present for several months, but recently has increased in size  and is causing him discomfort.  No nausea or vomiting had been noted.   PAST MEDICAL HISTORY:  Hypothyroidism, hypertension, depression, reflux  disease.   PAST SURGICAL HISTORY:  Prostate removal.   MEDICATIONS:  1. Lisinopril/hydrochlorothiazide 20/12.5 mg p.o. daily.  2. Synthroid 100 mcg p.o. daily.  3. Lexapro 10 mg p.o. daily.  4. Nexium 40 mg p.o. daily.   ALLERGIES:  No known drug allergies.   REVIEW OF SYSTEMS:  The patient smokes a pack of cigarettes a day.  Denies any alcohol use.  He denies any other cardiopulmonary difficulty  or bleeding disorders.   PHYSICAL EXAMINATION:  GENERAL:  The patient is a well-developed, well-  nourished, white male in no acute distress.  LUNGS:  Clear to auscultation with equal breath sounds bilaterally.  HEART:  Regular rate and rhythm without S3, S4, murmurs.  ABDOMEN:  Soft, nontender, nondistended.  No hepatosplenomegaly or  masses are noted.  A reducible right inguinal hernia is noted.  No left  inguinal hernia is present.  GENITOURINARY:  Within normal limits.   IMPRESSION:  Right inguinal hernia.   PLAN:  The patient was scheduled for a right inguinal herniorrhaphy on  April 17, 2007.  The risks and benefits of the procedure including  bleeding, infection, pain, and recurrence of the hernia were fully  explained to the patient,  who gave informed consent.      Dalia Heading, M.D.  Electronically Signed     MAJ/MEDQ  D:  04/13/2007  T:  04/13/2007  Job:  [id number]   cc:   [facility]   Kirk Ruths, M.D.  Fax: [phone]

## 2010-06-23 NOTE — Cardiovascular Report (Signed)
[name], [name] NO.:  [id number]   MEDICAL RECORD NO.:  [id number]          PATIENT TYPE:  INP   LOCATION:  2912                         FACILITY:  [facility]   PHYSICIAN:  Sheliah Mends, MD      DATE OF BIRTH:  Jun 17, 1929   DATE OF PROCEDURE:  07/12/2008  DATE OF DISCHARGE:                            CARDIAC CATHETERIZATION   INDICATION:  Scott [name] is a 77 year old gentleman with history of  hypertension, insulin resistance syndrome, and lifelong history of  tobacco use who presented with chest discomfort and exertional dyspnea.  A subsequent myocardial perfusion scan showed evidence of ischemia  primarily in the inferior wall.  Given the patient's symptoms and his  risk factors, a decision was made to proceed with cardiac  catheterization.   PROCEDURE:  After informed consent was obtained, the patient was brought  in the postabsorptive state to the second floor cardiac catheterization  lab at [facility].  He was draped in sterile fashion and  started on conscious sedation with Versed.  In addition, he received  local anesthesia using lidocaine 1% to the right groin.  Subsequently, a  5-French arterial sheath was placed in the femoral artery using the  modified Seldinger technique.  A 5-French left and right #4 Judkins  catheter was used for selective coronary angiography and a pigtail  catheter was used for left ventriculography and aortic root chart.   FINDINGS:  Hemodynamics:  Left ventricular pressure 159/6 mmHg.  Aortic  pressure 159/57 mmHg.  There was no significant aortic valve gradient.   Left ventriculography:  Left ventriculography in the RAO view shows  preserved left ventricular function and no evidence of significant  mitral regurgitation.   SELECTIVE CORONARY ANGIOGRAPHY:  Left main:  The left main coronary  artery is a medium-size calibered, short vessel that bifurcates into the  LAD and left circumflex artery.  The LAD is a  medium-sized calibered,  large vessel that wraps around the apex.  The LAD is diffusely diseased.  There are mild luminal irregularities in the proximal segment.  The  distal third of the proximal segment has an 85% lesion extending into  the diagonal branch.  This is followed by a 70% lesion in the mid  segment and a 60% lesion in the more distal mid segment, followed by  diffuse disease in the distal LAD.  The left circumflex artery is a  large vessel.  There is a 60% lesion in the mid segment followed by an  ulcerated plaque that is approximately 80%.  This is followed by 60-70%  lesions in the distal left circumflex artery.  There are left-to-right  collaterals seen from the distal LAD and circumflex to the right.  The  selective right coronary angiography showed a calcified RCA that is 100%  occluded at the ostium.  The distal RCA is visualized for left-to-right  collaterals.   SUMMARY:  Severe 3-vessel coronary artery disease with significant  lesions in the LAD and left circumflex and complete occlusion of the  RCA.  I discussed these findings with the interventionalist, Dr. Tresa Endo  and we both agree that the appropriate way to proceed is a consultation  with Cardiothoracic Surgery for coronary artery bypass  grafting.  The patient will be admitted to the hospital.  He developed  mild chest pain after the procedure.  He was started on nitroglycerin  and CT surgery service was consulted.   The procedure was completed without complications.      Sheliah Mends, MD  Electronically Signed     JE/MEDQ  D:  07/12/2008  T:  07/13/2008  Job:  [id number]

## 2010-06-23 NOTE — Assessment & Plan Note (Signed)
OFFICE VISIT   Scott [name]  DOB:  20-Feb-1929                                        September 12, 2008  CHART #:  [id number]   CURRENT PROBLEMS:  1. Recurrent left pleural effusion, status post multivessel bypass      grafting June 2010.  2. Chronic obstructive pulmonary disease with a prior history of      smoking.   PRESENT ILLNESS:  The patient returns for a followup exam and chest x-  ray to evaluate a recurrent left pleural effusion, status post CABG x4  this past June for class IV unstable angina with left main and three-  vessel coronary artery disease.  He has required thoracentesis 2-3 times  and recently finished a course of oral prednisone taper after 1 liter  thoracentesis in mid July.  He has also been taking Lasix 40 mg daily.  His breathing has improved and he has no pulmonary symptoms other than  intermittent mild productive cough.  He has had no angina and the  surgical incisions are healing well.  He has been doing his yard work  and his overall strength and conditioning have improved.   PHYSICAL EXAMINATION:  VITAL SIGNS:  Blood pressure 140/70, pulse 55 and  regular, respirations 18, and saturation 95% on room air.  LUNGS:  Breath sounds are clear and equal now.  CHEST:  The sternal incision is well healed.  CARDIAC:  Rhythm is regular.  EXTREMITIES:  There is no pedal edema.   PA and lateral chest x-ray shows improved aeration at the left base with  only mild blunting of the costophrenic angle, a clear improvement in the  volume of the left lower lobe and a reduction in the pleural effusion.   PLAN:  The patient was encouraged to begin the outpatient rehab program  in [place].  I will plan on seeing him back in 5 weeks with a  followup chest x-ray to make sure that he has no delayed reaccumulation  of the left pleural effusion.   ADDENDUM:  The patient has a known left internal carotid occlusion and  by ultrasound duplex exam,  the right carotid has a 40-60% stenosis.  He  questioned what would be done about that and I recommended that he have  annual duplex exams at [facility] and no  intervention will be needed at this time.    Kerin Perna, M.D.  Electronically Signed   PV/MEDQ  D:  09/12/2008  T:  09/13/2008  Job:  [id number]   cc:   Sheliah Mends, MD  Kirk Ruths, M.D.

## 2010-06-23 NOTE — Assessment & Plan Note (Signed)
OFFICE VISIT   [name], [name]  DOB:  November 21, 1929                                        October 18, 2008  CHART #:  [id number]   CURRENT PROBLEMS:  1. Recurrent left pleural effusion status post multivessel bypass      grafting in June 2010.  2. COPD with a reformed smoking history.   PRESENT ILLNESS:  The patient returns for a 65-month follow up with chest  x-ray to assess the status of his recurrent pleural effusion.  He  required thoracentesis twice and was also given a prednisone taper for  an inflammatory postoperative pleural effusion.  He is now back almost  to full activity levels.  He denies cough, shortness of breath or  pleuritic chest pain.  He has no angina and his blood pressure is under  better control after changing to his new combination agent Exforge  10/320/25 daily.  He has maintained a sinus rhythm and is doing well.   PHYSICAL EXAMINATION:  Vital Signs:  Blood pressure 135/80, pulse 60,  respirations 18, saturation 98%.  Lungs:  Breath sounds are clear and  equal.  Heart:  Rhythm is regular without gallop.  Chest:  The surgical  incisions are healed.  Extremities:  There is no significant pedal  edema.   PA and lateral chest x-ray shows resolution of the pleural effusion with  changes of COPD, but otherwise completely clear.   PLAN:  The patient will return to the care of his primary care and  cardiology physicians.  He is back to full activity without limitations  at this point.  I will see him as needed.   Kerin Perna, M.D.  Electronically Signed   PV/MEDQ  D:  10/18/2008  T:  10/19/2008  Job:  [id number]   cc:   Kirk Ruths, M.D.

## 2010-06-23 NOTE — Consult Note (Signed)
NAMEMATHAN, DARROCH NO.:  [id number]   MEDICAL RECORD NO.:  [id number]          PATIENT TYPE:  INP   LOCATION:  2912                         FACILITY:  [facility]   PHYSICIAN:  Kerin Perna, M.D.  DATE OF BIRTH:  02-09-1930   DATE OF CONSULTATION:  07/12/2008  DATE OF DISCHARGE:                                 CONSULTATION   REQUESTING PHYSICIAN:  Sheliah Mends, MD, [facility].   PRIMARY CARE PHYSICIAN:  Dr. Regino Schultze in [place].   REASON FOR CONSULTATION:  Severe three-vessel coronary artery disease  with unstable angina.   CHIEF COMPLAINT:  Exertional chest pain and dyspnea.   HISTORY OF PRESENT ILLNESS:  I was asked to evaluate this 77 year old  Caucasian male smoker for possible multivessel coronary bypass grafting  after he was recently diagnosed with severe three-vessel coronary artery  disease and preserved LV function.  The patient has been experiencing  progressive exertional chest pain and shortness of breath.  He was  initially evaluated in mid May at the [facility] office of Dr. Garen Lah.  A 2-D echo and stress test were performed.  The stress test was  consistent with ischemia, especially in the inferior wall.  Ejection  fraction was measured at 55%.  His 2-D echo showed no significant  valvular disease with EF of 60% and no evidence of elevated right-sided  pressures or pulmonary hypertension.  He was brought in for diagnostic  left heart cath yesterday.  This demonstrated good LVEF of 55% with  LVEDP of 6 mmHg.  He had severe three-vessel disease with 85% stenosis  of the LAD diagonal, 70-80% stenosis of an ulcerated plaque in the  circumflex and a totally occluded right dominant which reconstituted via  collaterals to the posterior descending.  There was no evidence of  mitral regurgitation.  Based on his severe two-vessel coronary disease  and symptoms of accelerating angina and a positive stress test, it was  felt that surgical revascularization would be his best option.  He is  currently stable in the CCU and started on IV heparin and nitroglycerin  following cath.  He has not had Plavix.   PAST MEDICAL HISTORY:  1. Hypertension.  2. Hypothyroidism.  3. Active smoker, one-half pack per day.  4. History of thrombocytopenia, evaluated by Dr. Ladona Horns. Neijstrom in      the past, but did not require any treatment.  He has had no active      bleeding problems.   HOME MEDICATIONS:  1. Synthroid 100 mcg daily.  2. Lexapro 10 mg daily.  3. Lisinopril/hydrochlorothiazide 20/12.5 p.o. daily.   ALLERGIES:  NONE.   SURGICAL HISTORY:  1. Status post prostatectomy for adenocarcinoma of the prostate in      1991, without evidence of recurrence.  2. Right inguinal hernia repair in 2009.   SOCIAL HISTORY:  The patient is retired and very active in his yard.  He  is married with adult children.  He smokes half a pack a day for 60  years.  No history of alcohol use.  FAMILY HISTORY:  Mother died of a stroke at age 30 and his father died  at age 35 of complications of diabetes.  One brother has history of  coronary disease.   REVIEW OF SYSTEMS:  CONSTITUTIONAL:  Negative for fever or weight loss.  ENT:  Negative for difficulty swallowing.  He has partial plates that he  does not wear.  THORACIC:  Negative for a history of chest trauma or abnormal chest x-  ray.  He does usually have morning productive cough, but no history of  recent URI requiring antibiotic therapy.  No history of hemoptysis.  CARDIAC:  Positive for his angina and coronary disease, but negative for  history of valve disease, cardiac murmur or arrhythmia.  Following his  cardiac cath, he had a short episode of atrial fibrillation which was  converted to sinus rhythm with amiodarone which has subsequently been  stopped due to a slow heart rate of 60 per minute.  GI:  Positive GERD.  Negative for hepatitis, jaundice, blood per  rectum  or peptic ulcer disease.  UROLOGIC:  Positive for a history of carcinoma of the prostate, status  post prostatectomy without significant symptoms of urinary incontinence  or retention.  No kidney stones.  ENDOCRINE:  Positive for hypothyroid disease and he has borderline diet-  controlled diabetes.  Hemoglobin A1c is pending.  VASCULAR:  Negative DVT claudication or TIA.  NEUROLOGIC:  Negative for stroke, seizure or syncope.  MUSCULOSKELETAL:  Positive mild generalized arthritis.  HEMATOLOGIC:  Positive for thrombocytopenia with platelet count between  100-115,000.  He was previously evaluated by Dr. Glenford Peers,  hematology in [place], but did not have a bone marrow biopsy.  He has  not had active bleeding problems.  He does have easy bruising and  ecchymoses over his forearms.  He has history of depression which has  been managed with Lexapro.   PHYSICAL EXAMINATION:  VITAL SIGNS:  The patient is 5 feet 10 and weighs  172 pounds.  Blood pressure is 127/60, pulse 60 and regular, respiration  18, saturation 96% on room air.  GENERAL APPEARANCE:  A Caucasian male in the CCU no acute distress.  HEENT:  Normocephalic.  Dentition adequate.  NECK:  Without JVD, mass or bruit.  LYMPHATICS:  Show no palpable supraclavicular or cervical adenopathy.  Breath sounds are with scattered rhonchi.  CARDIAC:  Regular rhythm without S3 gallop and there is no murmur  appreciated.  ABDOMEN:  Soft without pulsatile mass, no organomegaly, no tenderness.  EXTREMITIES:  Reveal mild clubbing but no cyanosis or edema.  Peripheral  pulses are intact.  NEUROLOGIC:  Alert and oriented without focal motor  deficit.  He has a compression dressing in the right groin.   LABORATORY DATA:  I reviewed the coronary arteriograms and agreed with  the impression that is was severe three-vessel disease with chronic  occlusion of the right coronary, high-grade stenosis of the LAD and  circumflex branches of  the left coronary.  He has preserved LV function  and his chest x-ray shows evidence of COPD and some calcification of the  distal aortic arch.  There are no pulmonary nodules or densities noted.  His creatinine is 1.1.  His platelet count prior to cath was 110,000,  blood sugar 98.  Urinalysis clear.   PLAN:  The patient will be scheduled for elective surgical evaluation  with bypass grafts planned to the LAD, possibly the diagonal, distal  circumflex marginal and the distal RCA.  Prior  to surgery, we will  optimize his  pulmonary status with bronchodilator and mucolytic therapy.  It does not  appear that antibiotics are necessary.  The indications, benefits and  risks and alternatives to surgical revascularization of his severe three-  vessel coronary artery disease was discussed with the patient and the  family and they understand and agree.  Thank for the consultation.      Kerin Perna, M.D.  Electronically Signed     PV/MEDQ  D:  07/13/2008  T:  07/13/2008  Job:  [id number]   cc:   Kirk Ruths, M.D.

## 2010-06-23 NOTE — Op Note (Signed)
Scott [name], Scott [name]                 ACCOUNT NO.:  [id number]   MEDICAL RECORD NO.:  [id number]          PATIENT TYPE:  AMB   LOCATION:  DAY                           FACILITY:  [facility]   PHYSICIAN:  Dalia Heading, M.D.  DATE OF BIRTH:  1929-12-09   DATE OF PROCEDURE:  04/17/2007  DATE OF DISCHARGE:                               OPERATIVE REPORT   PREOPERATIVE DIAGNOSIS:  Right inguinal hernia.   POSTOPERATIVE DIAGNOSIS:  Right inguinal hernia, indirect.   PROCEDURE:  Right inguinal herniorrhaphy.   SURGEON:  Dr. Franky Macho.   ANESTHESIA:  Spinal.   INDICATIONS:  The patient is a 77 year old white male who presents with  a symptomatic right inguinal hernia.  Risks and benefits of the  procedure including bleeding, infection, pain, and recurrence of the  hernia were fully explained to the patient, who gave informed consent.   PROCEDURE NOTE:  The patient was placed in the supine position.  After  spinal anesthesia was administered, the right groin region was prepped  and draped in the usual sterile technique with Betadine.  Surgical site  confirmation was performed.   The transverse incision was made in the right groin region down to the  external oblique aponeurosis.  The aponeurosis was incised to the  external ring.  A Penrose drain was placed around the spermatic cord.  The vas deferens was noted within the spermatic cord.  The ilioinguinal  nerve was identified and retracted inferiorly from the operative field.  An indirect hernia sac was found.  This was freed away from the  spermatic cord up to the peritoneal reflection and inverted.  A large-  size polypropylene mesh plug was then placed into this region and  secured to the transversalis fascia using a 2-0 Novofil interrupted  suture.  An onlay polypropylene mesh patch was then placed along the  floor of the inguinal canal and secured superiorly to the conjoined  tendon and inferiorly to the shelving edge of Poupart's  ligament with 2-  0 Novofil interrupted sutures.  The internal ring was recreated using a  2-0 Novofil interrupted suture.  The external oblique aponeurosis was  reapproximated using a 2-0 Vicryl running suture.  Subcutaneous layer  was reapproximated using a 3-0 Vicryl interrupted suture.  The skin was  closed using a 4-0 Vicryl subcuticular suture.  0.5% Sensorcaine was  instilled in the surrounding wound.  Dermabond was then applied.   All tape and needle counts correct at the end of the procedure.  The  patient was awakened and transferred to PACU in stable condition.   COMPLICATIONS:  None.   SPECIMEN:  None.   BLOOD LOSS:  Minimal.      Dalia Heading, M.D.  Electronically Signed     MAJ/MEDQ  D:  04/17/2007  T:  04/18/2007  Job:  [id number]   cc:   Kirk Ruths, M.D.  Fax: [phone]

## 2010-06-26 NOTE — Procedures (Signed)
Scott [name], ROHRBACH NO.:  [id number]   MEDICAL RECORD NO.:  [id number]          PATIENT TYPE:  AMB   LOCATION:  SDS                          FACILITY:  [facility]   PHYSICIAN:  Nanetta Batty, M.D.   DATE OF BIRTH:  24-Sep-1929   DATE OF PROCEDURE:  DATE OF DISCHARGE:  10/16/2009                    PERIPHERAL VASCULAR INVASIVE PROCEDURE   Scott [name] is a 77 year old his Caucasian male patient of Dr. Garen Lah  with history of CAD, status post coronary artery bypass grafting in the  past.  He has dyslipidemia on statin therapy and hypertension.  He is  complaining of lower extremity claudication and Dopplers performed in  our office revealed a right ABI of 1.04 and a left of 0.91.  He also has  a totally occluded left internal carotid with widely patent right.  He  was referred now for angiography and potential intervention.  Creatinine  is 1.58.   PROCEDURE DESCRIPTION:  The patient was brought to the Second Floor  [place] [facility] Angiographic Suite in the postabsorptive state.  He was  premedicated with p.o. Valium.  His left groin was prepped and shaved in  the usual sterile fashion.  Xylocaine 1% was used for local anesthesia.  A 5-French sheath was inserted into the left femoral artery using  standard Seldinger technique.  A 5-French tennis racket catheter was  used for midstream and distal abdominal aortography with bifemoral  runoff using digital subtraction bolus chase step-table technique.  Visipaque dye was used for the entirety of the case.  Retrograde aortic  pressures were monitored during the case.   HEMODYNAMICS:  Aortic systolic pressure 190, diastolic pressure 72.   ANGIOGRAPHIC RESULTS:  1. Abdominal aorta.      a.     Renal arteries - 90% left renal artery stenosis.      b.     Infrarenal abdominal aorta; fluoroscopically calcified       without aneurysmal dilatation or stenosis.  2. Left lower extremity.      a.     50-60% segmental lumpy bumpy  mid left SFA stenosis with       two-vessel runoff.  The anterior tibial was occluded.  3. Right lower extremity.      a.     40-50% segmental mid right SFA stenosis.      b.     95% fairly focal right popliteal stenosis due to two-vessel       runoff.   IMPRESSION:  [name] has high-grade right popliteal stenosis.  He has  two-vessel runoff.  Given his creatinine 1.58, I preferred a stage  intervention.  In addition, he does have high-grade left renal artery  stenosis, moderate renal insufficiency, hypertension and this may  require a percutaneous intervention as well.  Options for right lower  extremity revascularization include cutting balloon atherectomy versus  [place] directional atherectomy.  The lesion does not appear  calcified and therefore gotten back over the rotational atherectomy does  not seem to be a viable option.   The sheath was removed and the pressure was held in the groin to achieve  hemostasis.  The patient left lab in stable condition.   He will be hydrated for the next 5 hours.   Discharged home and will follow up with me in 1-2 weeks from the office.  He left the lab in stable condition.      Nanetta Batty, M.D.     Cordelia Pen  D:  10/16/2009  T:  10/17/2009  Job:  [id number]   cc:   [facility]  Sheliah Mends, MD  Kirk Ruths, M.D.  [facility] Angiographic Suite Second Floor   Electronically Signed by Nanetta Batty M.D. on 10/27/2009 04:31:02 PM

## 2010-07-02 ENCOUNTER — Ambulatory Visit (HOSPITAL_COMMUNITY)
Admission: RE | Admit: 2010-07-02 | Discharge: 2010-07-02 | Disposition: A | Discharge status: Home / Self Care | Payer: Medicare Other | Source: Ambulatory Visit | Attending: Cardiovascular Disease | Admitting: Cardiovascular Disease

## 2010-07-02 DIAGNOSIS — E785 Hyperlipidemia, unspecified: Secondary | ICD-10-CM | POA: Insufficient documentation

## 2010-07-02 DIAGNOSIS — Z951 Presence of aortocoronary bypass graft: Secondary | ICD-10-CM | POA: Insufficient documentation

## 2010-07-02 DIAGNOSIS — I700 Atherosclerosis of aorta: Secondary | ICD-10-CM | POA: Insufficient documentation

## 2010-07-02 DIAGNOSIS — T82898A Other specified complication of vascular prosthetic devices, implants and grafts, initial encounter: Secondary | ICD-10-CM | POA: Insufficient documentation

## 2010-07-02 DIAGNOSIS — Y831 Surgical operation with implant of artificial internal device as the cause of abnormal reaction of the patient, or of later complication, without mention of misadventure at the time of the procedure: Secondary | ICD-10-CM | POA: Insufficient documentation

## 2010-07-02 DIAGNOSIS — I4891 Unspecified atrial fibrillation: Secondary | ICD-10-CM | POA: Insufficient documentation

## 2010-07-02 DIAGNOSIS — I10 Essential (primary) hypertension: Secondary | ICD-10-CM | POA: Insufficient documentation

## 2010-07-02 DIAGNOSIS — I2510 Atherosclerotic heart disease of native coronary artery without angina pectoris: Secondary | ICD-10-CM | POA: Insufficient documentation

## 2010-07-02 DIAGNOSIS — I6529 Occlusion and stenosis of unspecified carotid artery: Secondary | ICD-10-CM | POA: Insufficient documentation

## 2010-07-02 DIAGNOSIS — I70219 Atherosclerosis of native arteries of extremities with intermittent claudication, unspecified extremity: Secondary | ICD-10-CM | POA: Insufficient documentation

## 2010-07-22 NOTE — Procedures (Signed)
Scott [name], Scott [name] NO.:  [id number]  MEDICAL RECORD NO.:  [id number]           PATIENT TYPE:  O  LOCATION:  [facility]                         FACILITY:  [facility]  PHYSICIAN:  Nanetta Batty, M.D.   DATE OF BIRTH:  [birth year]  DATE OF PROCEDURE: DATE OF DISCHARGE:                   PERIPHERAL VASCULAR INVASIVE PROCEDURE   HISTORY OF PRESENT ILLNESS:  Scott [name] is a 77 year old thin-appearing married Caucasian male father of 2, grandfather of 5 grandchildren who I recently saw on May 31, 2010.  He has a history of CAD status post bypass grafting in June 2010.  His other problems include hypertension, hyperlipidemia, and paroxysmal AFib without recurrence.  Dopplers in our office revealed a right ABI of 1 and a left 0.91.  He did have a high- frequency signal on his mid left SFA and right popliteal.  Carotid Dopplers revealed an occluded left ICA with mild right ICA stenosis.  He is neurologically asymptomatic.  Angiogram obtained on October 16, 2009, revealing a 90% ostial left renal artery stenosis which I stented, 95% right popliteal artery stenosis with two-vessel runoff and 50-60% left SFA stenosis with two-vessel runoff.  I performed TurboHawk atherectomy of his right popliteal using LSM TurboHawk atherectomy device.  His claudication resolved and his Dopplers normalized.  He saw Dr. Royann Shivers in the office on May 14, 2010, complaining of recurrent right lower extremity claudication.  Dopplers performed on his lower extremity showed an ABI dropping to 0.64 on the right with occlusion of his popliteal.  He does have lifestyle-limiting claudication.  He presents now for angiography and potential reintervention.  PROCEDURE DESCRIPTION:  The patient was brought to the [facility] angi[facility] suite in a postabsorptive state.  He was premedicated with p.o. Valium.  His left groin was prepped and shaved in usual sterile fashion.  Xylocaine 1% was  used for local anesthesia.  A 5- French sheath was inserted into the left femoral artery using standard Seldinger technique.  A 5-French pigtail catheter was used for abdominal aortography with bifemoral runoff using bolus chase digital subtraction step table technique.  A 5-French short right Judkins catheter was used for selective left renal artery angiography.  Visipaque dye was used for the entirety of the case.  Retrograde aortic pressures were monitored during the case.  ANGIOGRAPHIC RESULTS: 1. Abdominal aorta.     a.     Renal arteries - 50% "in-stent restenosis" within the left      renal artery stent.     b.     Infrarenal abdominal aorta; mild atherosclerosis without      aneurysmal dilatation or stenosis. 2. Left lower extremity;     a.     60% focal mid left SFA stenosis with two-vessel runoff.  The      anterior tibial was occluded __________ 3. Right lower extremity;     a.     Total above-to-below-the-knee popliteal with reconstitution      at the tibioperoneal trunk with two-vessel runoff.  Anterior      tibial was occluded.  IMPRESSION:  Scott [name] has moderate left renal artery in-stent restenosis with an occluded  popliteal.  I believe he is high risk for percutaneous intervention with a potential for distal embolization in his remaining two open tibial vessels.  I do not think there is a good long lasting percutaneous option for him.  We will begin with Pletal and recommend medical therapy for now.  He continues to have lifestyle limiting claudication, he would be a candidate for stem to bypass graft, although I think this is a less interactive option.  Sheath was removed and pressure was held in the groin to achieve hemostasis.  The patient left the lab in stable condition.  He will be gently hydrated for 5 hours, discharged home as an outpatient and he will see me back in the office with x-rays in followup.     Nanetta Batty, M.D.     Cordelia Pen  D:   07/02/2010  T:  07/03/2010  Job:  [id number]  cc:   Laurell Josephs [facility] Kirk Ruths, M.D.  Electronically Signed by Nanetta Batty M.D. on 07/22/2010 10:44:35 AM

## 2010-08-01 DIAGNOSIS — I739 Peripheral vascular disease, unspecified: Secondary | ICD-10-CM | POA: Insufficient documentation

## 2010-08-01 HISTORY — DX: Peripheral vascular disease, unspecified: I73.9

## 2010-11-02 LAB — CBC
HCT: 41.5
Hemoglobin: 14.8
MCHC: 35.5
MCV: 90.1
Platelets: 130 — ABNORMAL LOW
RBC: 4.61
RDW: 12.8
WBC: 7.8

## 2010-11-02 LAB — BASIC METABOLIC PANEL
BUN: 23
CO2: 26
Calcium: 9.3
Chloride: 104
Creatinine, Ser: 1.25
GFR calc Af Amer: >60
GFR calc non Af Amer: 56 — ABNORMAL LOW
Glucose, Bld: 147 — ABNORMAL HIGH
Potassium: 4.7
Sodium: 133 — ABNORMAL LOW

## 2011-02-15 DIAGNOSIS — J343 Hypertrophy of nasal turbinates: Secondary | ICD-10-CM | POA: Diagnosis not present

## 2011-02-15 DIAGNOSIS — J069 Acute upper respiratory infection, unspecified: Secondary | ICD-10-CM | POA: Diagnosis not present

## 2011-02-15 DIAGNOSIS — Z6826 Body mass index (BMI) 26.0-26.9, adult: Secondary | ICD-10-CM | POA: Diagnosis not present

## 2011-02-15 DIAGNOSIS — Z23 Encounter for immunization: Secondary | ICD-10-CM | POA: Diagnosis not present

## 2011-03-04 IMAGING — CR DG CHEST 2V
2 series · 2 of 2 positions shown · non-contrast
Comparison: 07/21/2008

CLINICAL DATA: Cough and chest pain.  Status post CABG.

CHEST - 2 VIEW

[w chest pa]
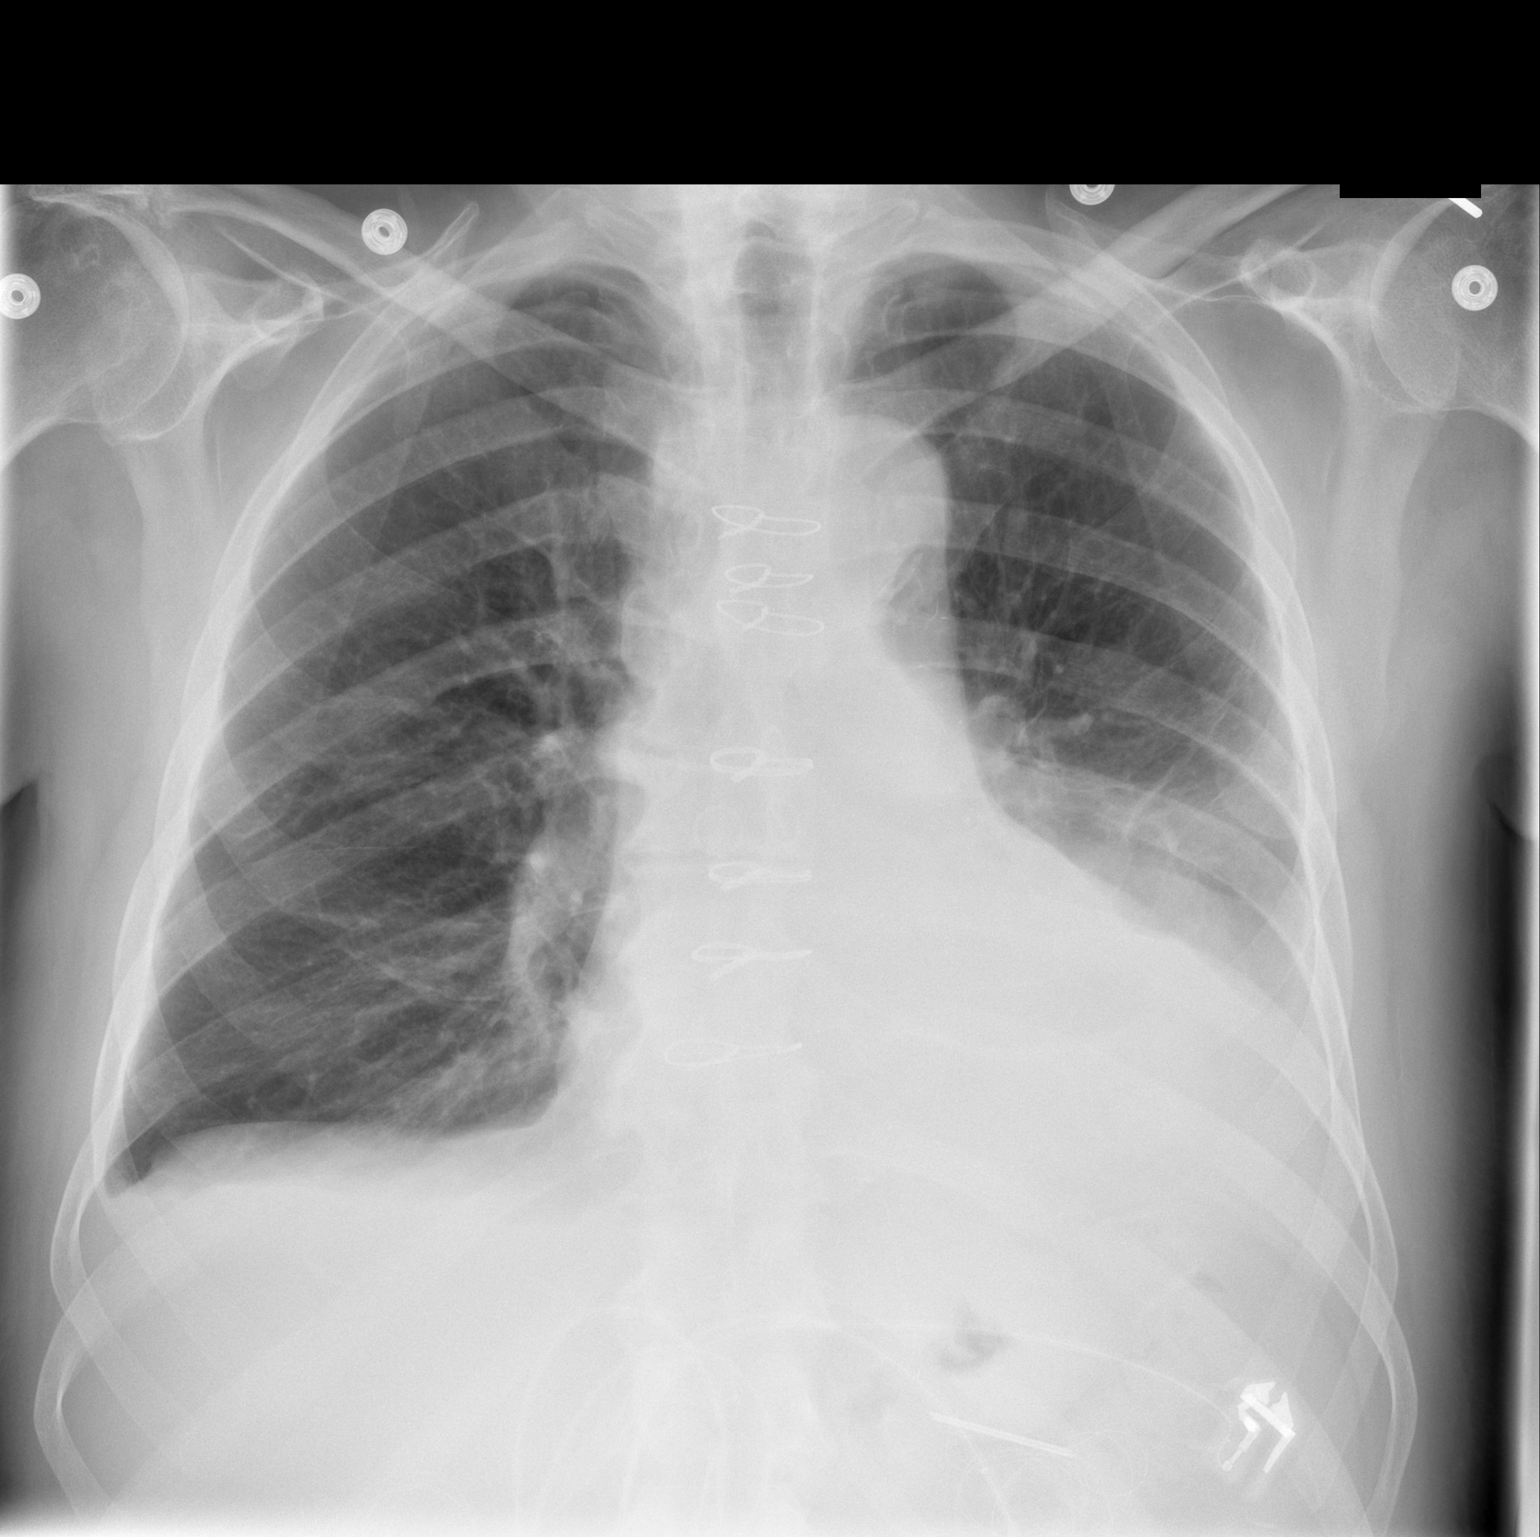

[w chest lat]
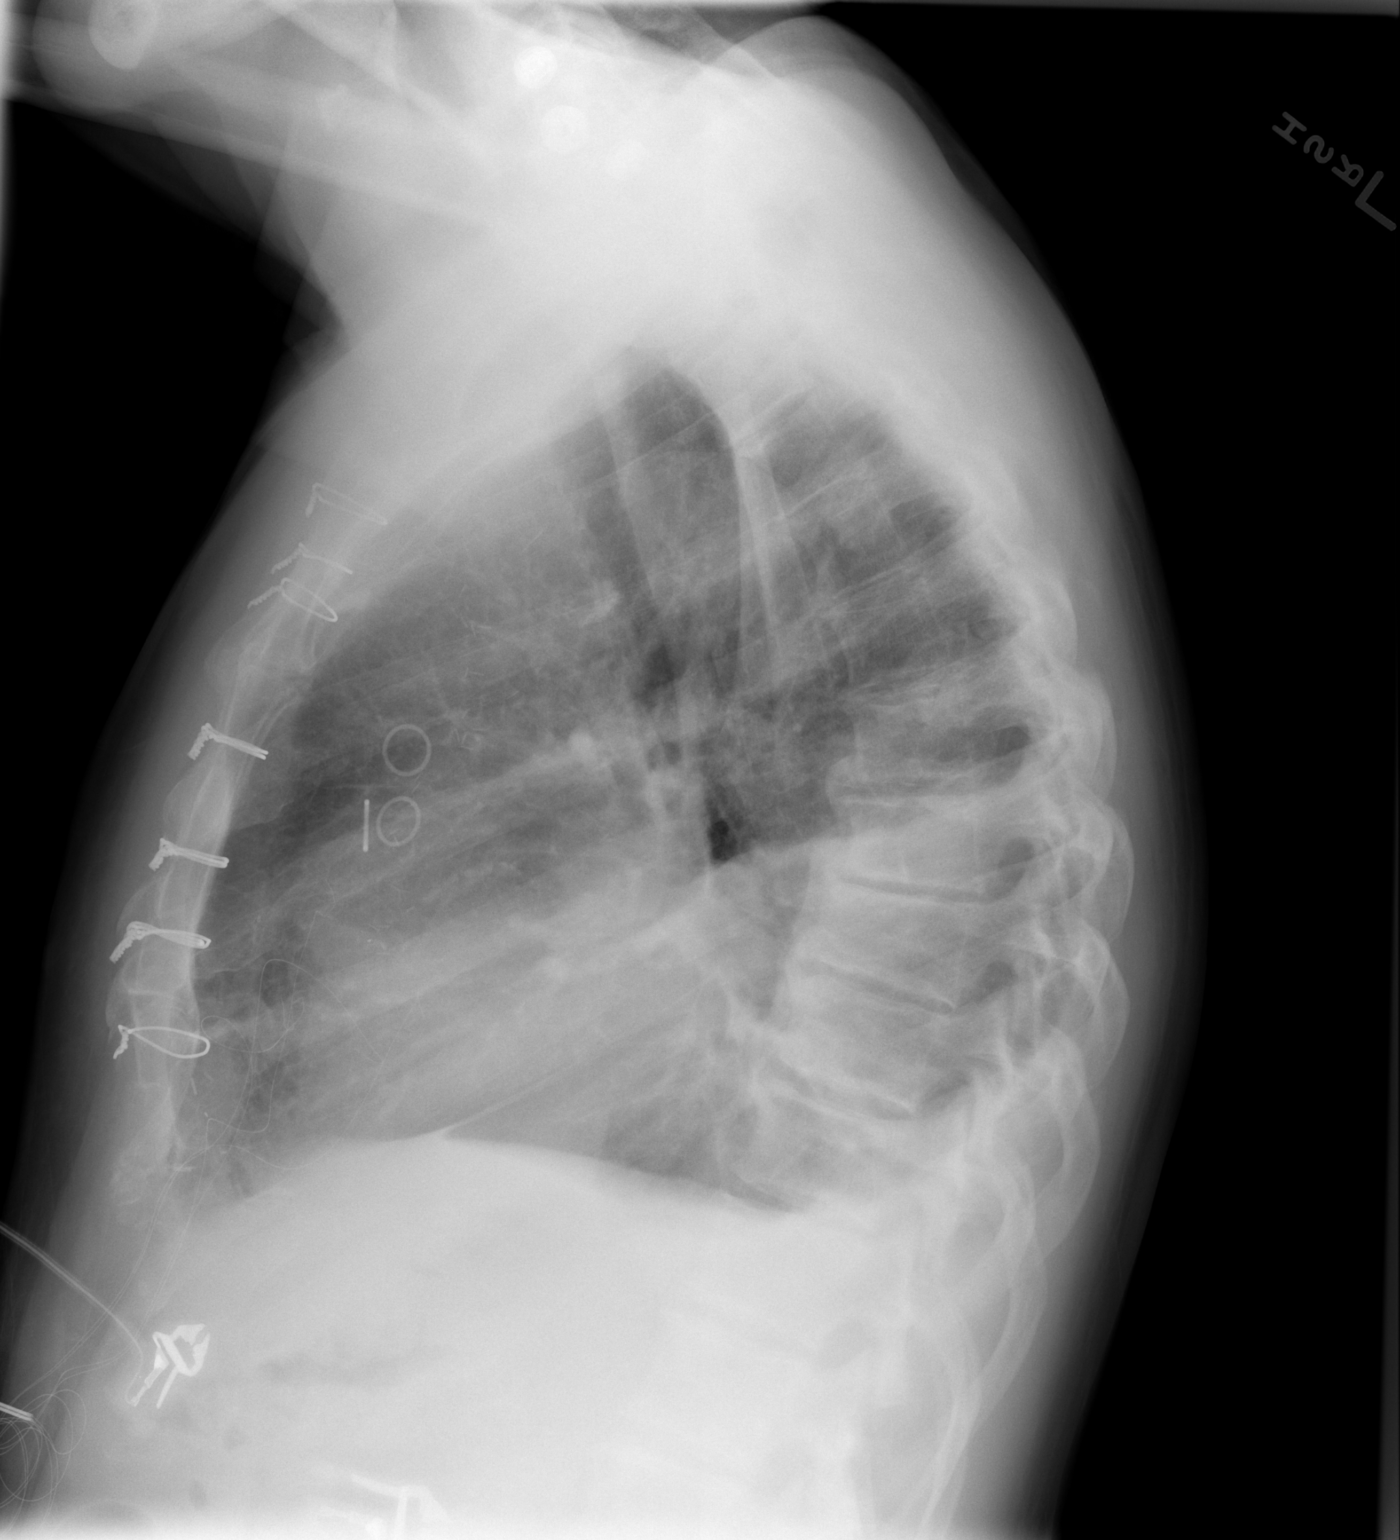

[2 of 2 positions shown; findings below may reference images not displayed]

FINDINGS: There is a moderate left effusion, essentially unchanged.
There is a small right effusion.

Heart size and vascularity are normal.  No pneumothorax.
IMPRESSION: 1.  Moderate left pleural effusion.
2.  Small right effusion.

## 2011-03-23 DIAGNOSIS — I2510 Atherosclerotic heart disease of native coronary artery without angina pectoris: Secondary | ICD-10-CM | POA: Diagnosis not present

## 2011-03-23 DIAGNOSIS — I10 Essential (primary) hypertension: Secondary | ICD-10-CM | POA: Diagnosis not present

## 2011-03-23 DIAGNOSIS — E782 Mixed hyperlipidemia: Secondary | ICD-10-CM | POA: Diagnosis not present

## 2011-03-23 DIAGNOSIS — I739 Peripheral vascular disease, unspecified: Secondary | ICD-10-CM | POA: Diagnosis not present

## 2011-05-31 DIAGNOSIS — I10 Essential (primary) hypertension: Secondary | ICD-10-CM | POA: Diagnosis not present

## 2011-05-31 DIAGNOSIS — I701 Atherosclerosis of renal artery: Secondary | ICD-10-CM | POA: Diagnosis not present

## 2011-06-21 DIAGNOSIS — E782 Mixed hyperlipidemia: Secondary | ICD-10-CM | POA: Diagnosis not present

## 2011-06-21 DIAGNOSIS — R7309 Other abnormal glucose: Secondary | ICD-10-CM | POA: Diagnosis not present

## 2011-06-21 DIAGNOSIS — E039 Hypothyroidism, unspecified: Secondary | ICD-10-CM | POA: Diagnosis not present

## 2011-06-24 DIAGNOSIS — E119 Type 2 diabetes mellitus without complications: Secondary | ICD-10-CM | POA: Diagnosis not present

## 2011-07-08 DIAGNOSIS — E119 Type 2 diabetes mellitus without complications: Secondary | ICD-10-CM | POA: Diagnosis not present

## 2011-07-08 DIAGNOSIS — Z6825 Body mass index (BMI) 25.0-25.9, adult: Secondary | ICD-10-CM | POA: Diagnosis not present

## 2011-08-05 DIAGNOSIS — I6529 Occlusion and stenosis of unspecified carotid artery: Secondary | ICD-10-CM | POA: Diagnosis not present

## 2011-09-09 DIAGNOSIS — I10 Essential (primary) hypertension: Secondary | ICD-10-CM | POA: Diagnosis not present

## 2011-09-09 DIAGNOSIS — I739 Peripheral vascular disease, unspecified: Secondary | ICD-10-CM | POA: Diagnosis not present

## 2011-09-09 DIAGNOSIS — I701 Atherosclerosis of renal artery: Secondary | ICD-10-CM | POA: Diagnosis not present

## 2011-09-09 DIAGNOSIS — I2510 Atherosclerotic heart disease of native coronary artery without angina pectoris: Secondary | ICD-10-CM | POA: Diagnosis not present

## 2011-10-01 DIAGNOSIS — E119 Type 2 diabetes mellitus without complications: Secondary | ICD-10-CM | POA: Diagnosis not present

## 2011-10-01 DIAGNOSIS — I10 Essential (primary) hypertension: Secondary | ICD-10-CM | POA: Diagnosis not present

## 2011-10-01 DIAGNOSIS — I2510 Atherosclerotic heart disease of native coronary artery without angina pectoris: Secondary | ICD-10-CM | POA: Diagnosis not present

## 2011-10-01 DIAGNOSIS — I739 Peripheral vascular disease, unspecified: Secondary | ICD-10-CM | POA: Diagnosis not present

## 2012-05-15 DIAGNOSIS — I2510 Atherosclerotic heart disease of native coronary artery without angina pectoris: Secondary | ICD-10-CM | POA: Diagnosis not present

## 2012-05-15 DIAGNOSIS — I4891 Unspecified atrial fibrillation: Secondary | ICD-10-CM | POA: Diagnosis not present

## 2012-05-15 DIAGNOSIS — E119 Type 2 diabetes mellitus without complications: Secondary | ICD-10-CM | POA: Diagnosis not present

## 2012-05-15 DIAGNOSIS — E782 Mixed hyperlipidemia: Secondary | ICD-10-CM | POA: Diagnosis not present

## 2012-05-16 DIAGNOSIS — Z79899 Other long term (current) drug therapy: Secondary | ICD-10-CM | POA: Diagnosis not present

## 2012-05-16 DIAGNOSIS — R5381 Other malaise: Secondary | ICD-10-CM | POA: Diagnosis not present

## 2012-05-16 DIAGNOSIS — E782 Mixed hyperlipidemia: Secondary | ICD-10-CM | POA: Diagnosis not present

## 2012-05-21 ENCOUNTER — Encounter: Payer: Self-pay | Admitting: *Deleted

## 2012-05-23 ENCOUNTER — Encounter: Payer: Self-pay | Admitting: Cardiovascular Disease

## 2012-05-24 ENCOUNTER — Other Ambulatory Visit (HOSPITAL_COMMUNITY): Payer: Self-pay | Admitting: Cardiovascular Disease

## 2012-05-24 DIAGNOSIS — R0602 Shortness of breath: Secondary | ICD-10-CM

## 2012-05-24 DIAGNOSIS — I4892 Unspecified atrial flutter: Secondary | ICD-10-CM

## 2012-05-31 ENCOUNTER — Ambulatory Visit (HOSPITAL_COMMUNITY)
Admission: RE | Admit: 2012-05-31 | Discharge: 2012-05-31 | Disposition: A | Discharge status: Home / Self Care | Payer: Medicare Other | Source: Ambulatory Visit | Attending: Cardiovascular Disease | Admitting: Cardiovascular Disease

## 2012-05-31 DIAGNOSIS — R0609 Other forms of dyspnea: Secondary | ICD-10-CM | POA: Insufficient documentation

## 2012-05-31 DIAGNOSIS — I359 Nonrheumatic aortic valve disorder, unspecified: Secondary | ICD-10-CM | POA: Insufficient documentation

## 2012-05-31 DIAGNOSIS — R0989 Other specified symptoms and signs involving the circulatory and respiratory systems: Secondary | ICD-10-CM | POA: Insufficient documentation

## 2012-05-31 DIAGNOSIS — I2510 Atherosclerotic heart disease of native coronary artery without angina pectoris: Secondary | ICD-10-CM | POA: Diagnosis not present

## 2012-05-31 DIAGNOSIS — I10 Essential (primary) hypertension: Secondary | ICD-10-CM | POA: Diagnosis not present

## 2012-05-31 DIAGNOSIS — E119 Type 2 diabetes mellitus without complications: Secondary | ICD-10-CM | POA: Insufficient documentation

## 2012-05-31 DIAGNOSIS — I079 Rheumatic tricuspid valve disease, unspecified: Secondary | ICD-10-CM | POA: Diagnosis not present

## 2012-05-31 DIAGNOSIS — I379 Nonrheumatic pulmonary valve disorder, unspecified: Secondary | ICD-10-CM | POA: Insufficient documentation

## 2012-05-31 DIAGNOSIS — I4892 Unspecified atrial flutter: Secondary | ICD-10-CM | POA: Diagnosis not present

## 2012-05-31 DIAGNOSIS — R0602 Shortness of breath: Secondary | ICD-10-CM

## 2012-05-31 DIAGNOSIS — I059 Rheumatic mitral valve disease, unspecified: Secondary | ICD-10-CM | POA: Diagnosis not present

## 2012-05-31 DIAGNOSIS — I4891 Unspecified atrial fibrillation: Secondary | ICD-10-CM | POA: Diagnosis not present

## 2012-05-31 NOTE — Progress Notes (Signed)
2D Echo Performed 05/31/2012    German Manke, RCS  

## 2012-06-12 DIAGNOSIS — I10 Essential (primary) hypertension: Secondary | ICD-10-CM | POA: Diagnosis not present

## 2012-06-12 DIAGNOSIS — I2510 Atherosclerotic heart disease of native coronary artery without angina pectoris: Secondary | ICD-10-CM | POA: Diagnosis not present

## 2012-06-12 DIAGNOSIS — I4891 Unspecified atrial fibrillation: Secondary | ICD-10-CM | POA: Diagnosis not present

## 2012-07-17 ENCOUNTER — Telehealth: Payer: Self-pay | Admitting: *Deleted

## 2012-07-17 MED ORDER — CILOSTAZOL 100 MG PO TABS: 100.0000 mg | ORAL_TABLET | Freq: Two times a day (BID) | ORAL | Status: DC

## 2012-07-17 NOTE — Telephone Encounter (Signed)
Nya-Returning your call about his medicine!

## 2012-07-17 NOTE — Telephone Encounter (Signed)
Message forwarded to N. Thomas, CMA. 

## 2012-07-18 ENCOUNTER — Other Ambulatory Visit: Payer: Self-pay | Admitting: *Deleted

## 2012-07-18 MED ORDER — CILOSTAZOL 100 MG PO TABS: 100.0000 mg | ORAL_TABLET | Freq: Two times a day (BID) | ORAL | Status: DC

## 2012-10-13 ENCOUNTER — Other Ambulatory Visit: Payer: Self-pay | Admitting: *Deleted

## 2012-10-13 MED ORDER — VALSARTAN-HYDROCHLOROTHIAZIDE 320-25 MG PO TABS: 1.0000 | ORAL_TABLET | Freq: Every day | ORAL | Status: DC

## 2012-10-13 MED ORDER — AMLODIPINE BESYLATE 10 MG PO TABS: 10.0000 mg | ORAL_TABLET | Freq: Every day | ORAL | Status: DC

## 2012-10-13 NOTE — Telephone Encounter (Signed)
Rx was sent to pharmacy electronically. 

## 2012-10-25 ENCOUNTER — Telehealth: Payer: Self-pay | Admitting: Cardiovascular Disease

## 2012-10-25 NOTE — Telephone Encounter (Signed)
Returned call and informed pt per instructions by NP.  Pt verbalized understanding and agreed w/ plan.

## 2012-10-25 NOTE — Telephone Encounter (Signed)
Nose been bleeding for the last 2 days.Woonder if he needs to stop taking his Xarelto?

## 2012-10-25 NOTE — Telephone Encounter (Signed)
Returned call.  Pt c/o nosebleed x 2 days.  Stated he had it a time or two before and wanted to know if he should stop the Xarelto or not.  Stated it started Monday night and bled all night.  Also used cold compress.   Stated it is not bleeding right now and stopped after he packed it.  Pt advised to avoid blowing nose x 2 hrs after bleeding stopped and continue w/ cold compress.  Pt also informed he can use vaseline to line nose.  Informed RN will notify NP as Dr. Allyson Sabal is out of the office today.  Pt verbalized understanding and agreed w/ plan.  Message forwarded to L. Annie Paras, NP for further instructions.

## 2012-10-25 NOTE — Telephone Encounter (Signed)
Have him hold xarelto for one day.  He can spray nose with afrin nasal spray then pack.  This may help stop the bleeding.  If continue to bleed should go to ER for eval.  Or if he has ENT physician then see him.  Call back if the bleeding continues.

## 2012-10-25 NOTE — Telephone Encounter (Signed)
Patient is on Xarelto.  Patient has had a nose bleed since Monday.  Patient is also feeling dizzy today.

## 2012-10-25 NOTE — Telephone Encounter (Signed)
Call to pt r/t "Robert Bellow" not being on [facility] list.  RN asked pt if anything had changed since previous call b/c call received from [place].  Pt stated Verlon Au called and he did not tell her to.  Pt denied any changes in symptoms and agreed to go to ER as instructed previously for worsening symptoms.

## 2012-11-03 ENCOUNTER — Other Ambulatory Visit (HOSPITAL_COMMUNITY): Payer: Self-pay | Admitting: Cardiovascular Disease

## 2012-11-14 ENCOUNTER — Ambulatory Visit (HOSPITAL_COMMUNITY)
Admission: RE | Admit: 2012-11-14 | Discharge: 2012-11-14 | Disposition: A | Discharge status: Home / Self Care | Payer: Medicare Other | Source: Ambulatory Visit | Attending: Cardiovascular Disease | Admitting: Cardiovascular Disease

## 2012-11-14 DIAGNOSIS — I6529 Occlusion and stenosis of unspecified carotid artery: Secondary | ICD-10-CM | POA: Insufficient documentation

## 2012-11-14 NOTE — Progress Notes (Signed)
Carotid Duplex Completed. °Brianna L Mazza,RVT °

## 2012-11-17 ENCOUNTER — Encounter: Payer: Self-pay | Admitting: *Deleted

## 2012-11-17 ENCOUNTER — Telehealth: Payer: Self-pay | Admitting: *Deleted

## 2012-11-17 DIAGNOSIS — I6529 Occlusion and stenosis of unspecified carotid artery: Secondary | ICD-10-CM

## 2012-11-17 NOTE — Telephone Encounter (Signed)
Message copied by Marella Bile on Fri Nov 17, 2012  9:14 PM ------      Message from: Runell Gess      Created: Thu Nov 16, 2012  6:46 PM       No change from prior study. Repeat in 12 months. ------

## 2012-11-17 NOTE — Telephone Encounter (Signed)
Order placed for repeat carotid dopplers in 1 year  

## 2012-11-22 DIAGNOSIS — Z7901 Long term (current) use of anticoagulants: Secondary | ICD-10-CM | POA: Diagnosis not present

## 2012-11-22 DIAGNOSIS — Z23 Encounter for immunization: Secondary | ICD-10-CM | POA: Diagnosis not present

## 2012-11-22 DIAGNOSIS — IMO0002 Reserved for concepts with insufficient information to code with codable children: Secondary | ICD-10-CM | POA: Diagnosis not present

## 2012-11-22 DIAGNOSIS — M25519 Pain in unspecified shoulder: Secondary | ICD-10-CM | POA: Diagnosis not present

## 2012-12-13 ENCOUNTER — Ambulatory Visit (INDEPENDENT_AMBULATORY_CARE_PROVIDER_SITE_OTHER): Payer: Medicare Other | Admitting: Cardiology

## 2012-12-13 ENCOUNTER — Encounter: Payer: Self-pay | Admitting: Cardiology

## 2012-12-13 VITALS — BP 140/60 | HR 55 | Ht 70.0 in | Wt 171.0 lb

## 2012-12-13 DIAGNOSIS — I2510 Atherosclerotic heart disease of native coronary artery without angina pectoris: Secondary | ICD-10-CM

## 2012-12-13 DIAGNOSIS — I4510 Unspecified right bundle-branch block: Secondary | ICD-10-CM

## 2012-12-13 DIAGNOSIS — I480 Paroxysmal atrial fibrillation: Secondary | ICD-10-CM

## 2012-12-13 DIAGNOSIS — I739 Peripheral vascular disease, unspecified: Secondary | ICD-10-CM

## 2012-12-13 DIAGNOSIS — E039 Hypothyroidism, unspecified: Secondary | ICD-10-CM | POA: Diagnosis not present

## 2012-12-13 DIAGNOSIS — I10 Essential (primary) hypertension: Secondary | ICD-10-CM | POA: Diagnosis not present

## 2012-12-13 DIAGNOSIS — E785 Hyperlipidemia, unspecified: Secondary | ICD-10-CM

## 2012-12-13 DIAGNOSIS — N183 Chronic kidney disease, stage 3 unspecified: Secondary | ICD-10-CM

## 2012-12-13 DIAGNOSIS — Z7901 Long term (current) use of anticoagulants: Secondary | ICD-10-CM

## 2012-12-13 DIAGNOSIS — I4891 Unspecified atrial fibrillation: Secondary | ICD-10-CM

## 2012-12-13 LAB — BASIC METABOLIC PANEL
BUN: 41 mg/dL — ABNORMAL HIGH (ref 6–23)
CO2: 24 mEq/L (ref 19–32)
Calcium: 8.8 mg/dL (ref 8.4–10.5)
Chloride: 105 mEq/L (ref 96–112)
Creat: 1.58 mg/dL — ABNORMAL HIGH (ref 0.50–1.35)
Glucose, Bld: 152 mg/dL — ABNORMAL HIGH (ref 70–99)
Potassium: 4.6 mEq/L (ref 3.5–5.3)
Sodium: 137 mEq/L (ref 135–145)

## 2012-12-13 NOTE — Assessment & Plan Note (Signed)
Last SCr 1.7 

## 2012-12-13 NOTE — Assessment & Plan Note (Signed)
NSR today 

## 2012-12-13 NOTE — Assessment & Plan Note (Signed)
Controlled.  

## 2012-12-13 NOTE — Assessment & Plan Note (Signed)
Xarelto

## 2012-12-13 NOTE — Patient Instructions (Signed)
Your physician recommends that you schedule a follow-up appointment in: 6 months with Dr Berry Your physician recommends that you have lab work today. 

## 2012-12-13 NOTE — Assessment & Plan Note (Signed)
No angina 

## 2012-12-13 NOTE — Assessment & Plan Note (Signed)
Known occluded LICA, prior Lt renal and Rt popliteal stenting

## 2012-12-13 NOTE — Progress Notes (Signed)
12/13/2012 Scott [name]   21-Mar-1929  [id number]  Primary Physicia Kirk Ruths, MD Primary Cardiologist: Dr Allyson Sabal  HPI:  The patient is an 79 y/o followed by Dr Allyson Sabal who has a history of CAD, status post coronary artery bypass grafting June 2010 with a LIMA to his LAD, a vein to an obtuse marginal branch, a diagonal branch, and right coronary artery. Myoview in Aug 2013 was low risk.            His other problems include hypertension, hyperlipidemia, as well as paroxysmal AFib without recurrence. He has known peripheral vascular occlusive disease and an occluded left carotid, mild right ICA stenosis neurologically asymptomatic. Dr berry stented his left renal artery Sept 2011 and performed atherectomy on his right popliteal artery Oct 2011. However, followup Dopplers and angiography have shown his popliteal artery to be occluded. His carotid Dopplers and renal Dopplers have remained stable. He denies claudication.He had a Myoview performed October 01, 2011, that showed diaphragmatic attenuation without ischemia. Based on his AFib and high CHADS2 score, Dr Allyson Sabal elected to anticoagulate him with renally dosed Xarelto. He has had no further AF since his Metoprolol was increased to 25 mg BID.  A 2D echo performed on May 31, 2012, revealed normal LV systolic function, grade 1 diastolic dysfunction without significant valvular abnormalities.              He is here for a 6 month check. He denies chest pain. He did complain about the cost of Xarelto and wanted to know about going back on Plavix instead. I explained that this would not be as effective. He has not had any further nose bleeds.     Current Outpatient Prescriptions  Medication Sig Dispense Refill  . amLODipine (NORVASC) 10 MG tablet Take 1 tablet (10 mg total) by mouth daily.  30 tablet  10  . cilostazol (PLETAL) 100 MG tablet Take 1 tablet (100 mg total) by mouth 2 (two) times daily.  60 tablet  12  . escitalopram (LEXAPRO) 10 MG  tablet Take 10 mg by mouth daily.      [name] levothyroxine (SYNTHROID, LEVOTHROID) 88 MCG tablet Take 88 mcg by mouth daily before breakfast.      . metoprolol tartrate (LOPRESSOR) 25 MG tablet Take 12.5 mg by mouth 2 (two) times daily.      . rosuvastatin (CRESTOR) 10 MG tablet Take 5 mg by mouth daily.      . valsartan-hydrochlorothiazide (DIOVAN-HCT) 320-25 MG per tablet Take 1 tablet by mouth daily.  30 tablet  10  . XARELTO 15 MG TABS tablet 15 mg.      . Amlodipine-Valsartan-HCTZ (EXFORGE HCT) 10-320-25 MG TABS Take by mouth 1 day or 1 dose.      [name] aspirin 81 MG tablet Take 81 mg by mouth daily.      . clopidogrel (PLAVIX) 75 MG tablet Take 75 mg by mouth daily.       No current facility-administered medications for this visit.    No Known Allergies  History   Social History  . Marital Status: Married    Spouse Name: N/A    Number of Children: 2  . Years of Education: N/A   Occupational History  . retired    Social History Main Topics  . Smoking status: Former Smoker    Types: Cigarettes    Quit date: 10/29/2011  . Smokeless tobacco: Not on file  . Alcohol Use: No  . Drug Use: Not on file  .  Sexual Activity: Not on file   Other Topics Concern  . Not on file   Social History Narrative  . No narrative on file     Review of Systems: General: negative for chills, fever, night sweats or weight changes.  Cardiovascular: negative for chest pain, dyspnea on exertion, edema, orthopnea, palpitations, paroxysmal nocturnal dyspnea or shortness of breath Dermatological: negative for rash Respiratory: negative for cough or wheezing Urologic: negative for hematuria Abdominal: negative for nausea, vomiting, diarrhea, bright red blood per rectum, melena, or hematemesis Neurologic: negative for visual changes, syncope, or dizziness All other systems reviewed and are otherwise negative except as noted above.    Blood pressure 140/60, pulse 55, height 5\' 10"  (1.778 m), weight 171  lb (77.565 kg).  General appearance: alert, cooperative and no distress Neck: no JVD and bilat bruits Lungs: clear to auscultation bilaterally and few rhonchi Heart: regular rate and rhythm and 1-2/6 MR murmur  EKG NSR, RBBB, LAFB  ASSESSMENT AND PLAN:   CAD- CABG 6/10, low risk Myoview 8/13 No angina  Peripheral vascular disease Known occluded LICA, prior Lt renal and Rt popliteal stenting  Hypertension Controlled  PAF (paroxysmal atrial fibrillation) NSR today  Chronic renal insufficiency, stage III (moderate) Last SCr 1.7  Chronic anticoagulation Xarelto    PLAN  Same cardiac Rx. I did give him discount card for Xarelto. We could change him to Coumadin but he would like to avoid that if possible. I will order a BMP, his last SCr was 1.74 April 2014. His lipids looked good then with an LDL of 53. He can see Dr Allyson Sabal in 6 months.  Oneta Sigman KPA-C 12/13/2012 1:38 PM

## 2012-12-19 ENCOUNTER — Other Ambulatory Visit: Payer: Self-pay

## 2012-12-19 MED ORDER — METOPROLOL TARTRATE 25 MG PO TABS: 12.5000 mg | ORAL_TABLET | Freq: Two times a day (BID) | ORAL | Status: DC

## 2012-12-19 NOTE — Telephone Encounter (Signed)
Rx was sent to pharmacy electronically. 

## 2012-12-20 ENCOUNTER — Other Ambulatory Visit: Payer: Self-pay | Admitting: *Deleted

## 2012-12-20 DIAGNOSIS — N189 Chronic kidney disease, unspecified: Secondary | ICD-10-CM

## 2012-12-20 NOTE — Progress Notes (Signed)
Talked to and he stated Dr. Allyson Sabal had taken him off exforge and was only taking valsartan Hctz. Corine Shelter PA informed and I received orders to Order a BMP for 1 month from today

## 2012-12-26 ENCOUNTER — Encounter: Payer: Self-pay | Admitting: Cardiology

## 2012-12-26 ENCOUNTER — Other Ambulatory Visit: Payer: Self-pay | Admitting: *Deleted

## 2012-12-26 MED ORDER — METOPROLOL TARTRATE 25 MG PO TABS: 25.0000 mg | ORAL_TABLET | Freq: Two times a day (BID) | ORAL | Status: DC

## 2013-01-12 ENCOUNTER — Other Ambulatory Visit (HOSPITAL_COMMUNITY): Payer: Self-pay | Admitting: Cardiovascular Disease

## 2013-01-12 ENCOUNTER — Other Ambulatory Visit: Payer: Self-pay | Admitting: *Deleted

## 2013-01-12 MED ORDER — ROSUVASTATIN CALCIUM 10 MG PO TABS: 5.0000 mg | ORAL_TABLET | Freq: Every day | ORAL | Status: DC

## 2013-01-12 NOTE — Telephone Encounter (Signed)
Rx was sent to pharmacy electronically. 

## 2013-02-10 ENCOUNTER — Other Ambulatory Visit (HOSPITAL_COMMUNITY): Payer: Self-pay | Admitting: Cardiovascular Disease

## 2013-03-13 ENCOUNTER — Other Ambulatory Visit: Payer: Self-pay | Admitting: *Deleted

## 2013-03-13 MED ORDER — ROSUVASTATIN CALCIUM 10 MG PO TABS: 10.0000 mg | ORAL_TABLET | Freq: Every day | ORAL | Status: DC

## 2013-06-01 DIAGNOSIS — IMO0002 Reserved for concepts with insufficient information to code with codable children: Secondary | ICD-10-CM | POA: Diagnosis not present

## 2013-06-01 DIAGNOSIS — M7150 Other bursitis, not elsewhere classified, unspecified site: Secondary | ICD-10-CM | POA: Diagnosis not present

## 2013-06-01 DIAGNOSIS — I10 Essential (primary) hypertension: Secondary | ICD-10-CM | POA: Diagnosis not present

## 2013-06-12 ENCOUNTER — Other Ambulatory Visit: Payer: Self-pay | Admitting: Cardiovascular Disease

## 2013-06-12 NOTE — Telephone Encounter (Signed)
Rx was sent to pharmacy electronically. 

## 2013-07-11 DIAGNOSIS — Z6825 Body mass index (BMI) 25.0-25.9, adult: Secondary | ICD-10-CM | POA: Diagnosis not present

## 2013-07-11 DIAGNOSIS — M19019 Primary osteoarthritis, unspecified shoulder: Secondary | ICD-10-CM | POA: Diagnosis not present

## 2013-08-03 ENCOUNTER — Encounter: Payer: Self-pay | Admitting: Cardiovascular Disease

## 2013-08-03 ENCOUNTER — Ambulatory Visit (INDEPENDENT_AMBULATORY_CARE_PROVIDER_SITE_OTHER): Payer: Medicare Other | Admitting: Cardiovascular Disease

## 2013-08-03 VITALS — BP 112/56 | HR 57 | Ht 70.5 in | Wt 178.0 lb

## 2013-08-03 DIAGNOSIS — I739 Peripheral vascular disease, unspecified: Secondary | ICD-10-CM | POA: Diagnosis not present

## 2013-08-03 DIAGNOSIS — Z79899 Other long term (current) drug therapy: Secondary | ICD-10-CM

## 2013-08-03 DIAGNOSIS — I2510 Atherosclerotic heart disease of native coronary artery without angina pectoris: Secondary | ICD-10-CM | POA: Diagnosis not present

## 2013-08-03 DIAGNOSIS — I4891 Unspecified atrial fibrillation: Secondary | ICD-10-CM

## 2013-08-03 DIAGNOSIS — I480 Paroxysmal atrial fibrillation: Secondary | ICD-10-CM

## 2013-08-03 DIAGNOSIS — E785 Hyperlipidemia, unspecified: Secondary | ICD-10-CM | POA: Diagnosis not present

## 2013-08-03 DIAGNOSIS — I10 Essential (primary) hypertension: Secondary | ICD-10-CM

## 2013-08-03 NOTE — Assessment & Plan Note (Signed)
On renal dose Xarelto maintaining sinus rhythm

## 2013-08-03 NOTE — Patient Instructions (Signed)
We request that you follow-up in: 6 months with Scott [name] and in 1 year with Dr San MorelleBerry  You will receive a reminder letter in the mail two months in advance. If you don't receive a letter, please call our office to schedule the follow-up appointment.  Your physician recommends that you return for a FASTING lipid profile:

## 2013-08-03 NOTE — Assessment & Plan Note (Signed)
On statin therapy. We will recheck a lipid and liver profile 

## 2013-08-03 NOTE — Progress Notes (Signed)
08/03/2013 Scott [name]   Nov 28, 1929  [id number]  Primary Physician Kirk RuthsMCGOUGH,WILLIAM M, MD Primary Cardiologist: Runell GessJonathan J. Berry MD Roseanne RenoFACP,FACC,FAHA, FSCAI    HPI:  The patient returns today for followup. He is an 80 year old thin-appearing married Caucasian male, father of 2 and grandfather to 4 grandchildren, whom I last saw in the office a month ago. He has a history of CAD, status post coronary artery bypass grafting June 2010 with a LIMA to his LAD, a vein to an obtuse marginal branch, a diagonal branch, and right coronary artery.   His other problems include hypertension, hyperlipidemia, and newly diagnosed non-insulin-requiring diabetes as well as paroxysmal AFib without recurrence. He has known peripheral vascular occlusive disease and an occluded left carotid, mild right ICA stenosis neurologically asymptomatic. I stented his left renal artery and performed atherectomy on his right popliteal artery. However, followup Dopplers and angiography have shown his popliteal artery to be occluded. His carotid Dopplers and renal Dopplers have remained stable. When I saw him a month ago he did complain of some mild increasing shortness of breath but denied chest pain. He had a Myoview performed October 01, 2011, that showed diaphragmatic attenuation without ischemia. Based on his AFib and high CHADS2 score, I elected to systemically anticoagulate him with renally dosed Xarelto and increased his metoprolol from 12.5 to 25 mg p.o. b.i.d. He has had no recurrent episodes of PAF. A 2D echo performed on May 31, 2012, revealed normal LV systolic function, grade 1 diastolic dysfunction without significant valvular abnormalities.  Since I saw him a year ago has remained clinically stable. He did see Eli PhillipsLuke  KilroyPA-C in the office 12/13/12.   Current Outpatient Prescriptions  Medication Sig Dispense Refill  . amLODipine (NORVASC) 10 MG tablet Take 1 tablet (10 mg total) by mouth daily.  30 tablet  10    . cilostazol (PLETAL) 100 MG tablet Take 1 tablet (100 mg total) by mouth 2 (two) times daily.  60 tablet  12  . escitalopram (LEXAPRO) 10 MG tablet Take 10 mg by mouth daily.      [name]. levothyroxine (SYNTHROID, LEVOTHROID) 88 MCG tablet Take 88 mcg by mouth daily before breakfast.      . metoprolol tartrate (LOPRESSOR) 25 MG tablet TAKE (1) TABLET BY MOUTH TWICE DAILY.  60 tablet  6  . rosuvastatin (CRESTOR) 10 MG tablet Take 1 tablet (10 mg total) by mouth daily.  30 tablet  6  . valsartan-hydrochlorothiazide (DIOVAN-HCT) 320-25 MG per tablet Take 1 tablet by mouth daily.  30 tablet  10  . XARELTO 15 MG TABS tablet TAKE ONE TABLET BY MOUTH ONCE DAILY.  30 tablet  5   No current facility-administered medications for this visit.    No Known Allergies  History   Social History  . Marital Status: Married    Spouse Name: N/A    Number of Children: 2  . Years of Education: N/A   Occupational History  . retired    Social History Main Topics  . Smoking status: Former Smoker    Types: Cigarettes    Quit date: 07/28/2008  . Smokeless tobacco: Not on file  . Alcohol Use: No  . Drug Use: Not on file  . Sexual Activity: Not on file   Other Topics Concern  . Not on file   Social History Narrative  . No narrative on file     Review of Systems: General: negative for chills, fever, night sweats or weight changes.  Cardiovascular: negative for chest pain, dyspnea on exertion, edema, orthopnea, palpitations, paroxysmal nocturnal dyspnea or shortness of breath Dermatological: negative for rash Respiratory: negative for cough or wheezing Urologic: negative for hematuria Abdominal: negative for nausea, vomiting, diarrhea, bright red blood per rectum, melena, or hematemesis Neurologic: negative for visual changes, syncope, or dizziness All other systems reviewed and are otherwise negative except as noted above.    Blood pressure 112/56, pulse 57, height 5' 10.5" (1.791 m), weight 178 lb  (80.74 kg).  General appearance: alert and no distress Neck: no adenopathy, no JVD, supple, symmetrical, trachea midline, thyroid not enlarged, symmetric, no tenderness/mass/nodules and soft right carotid bruit Lungs: clear to auscultation bilaterally Heart: regular rate and rhythm, S1, S2 normal, no murmur, click, rub or gallop Extremities: extremities normal, atraumatic, no cyanosis or edema  EKG sinus bradycardia at 57 with right bundle branch block and left axis deviation  ASSESSMENT AND PLAN:   Peripheral vascular disease History of known right internal carotid occlusion. He also has a known left renal artery stent which was placed back in 2011 as well as a right popliteal artery [facility] at the same time which was subsequently shown to have occluded. He denies claudication. We have been following his carotid, renal and lower extremity Doppler studies.  PAF (paroxysmal atrial fibrillation) On renal dose Xarelto maintaining sinus rhythm  Hypertension Controlled on current medications  Dyslipidemia On statin therapy. We will recheck a lipid and liver profile  CAD- CABG 6/10, low risk Myoview 8/13 Status post coronary artery bypass grafting June 2002 with LIMA to his LAD, vein to obtuse marginal branch, diagonal branch and right coronary artery. His last Myoview stress test performed 10/01/11 was nonischemic. He denies chest pain or shortness of breath      Runell GessJonathan J. Berry MD [facility] Surgery Center LLCFACP,FACC,FAHA, [facility] Of Altamonte SpringsFSCAI 08/03/2013 11:22 AM

## 2013-08-03 NOTE — Assessment & Plan Note (Signed)
History of known right internal carotid occlusion. He also has a known left renal artery stent which was placed back in 2011 as well as a right popliteal artery [facility] at the same time which was subsequently shown to have occluded. He denies claudication. We have been following his carotid, renal and lower extremity Doppler studies.

## 2013-08-03 NOTE — Assessment & Plan Note (Signed)
Controlled on current medications 

## 2013-08-03 NOTE — Assessment & Plan Note (Signed)
Status post coronary artery bypass grafting June 2002 with LIMA to his LAD, vein to obtuse marginal branch, diagonal branch and right coronary artery. His last Myoview stress test performed 10/01/11 was nonischemic. He denies chest pain or shortness of breath

## 2013-08-08 DIAGNOSIS — E785 Hyperlipidemia, unspecified: Secondary | ICD-10-CM | POA: Diagnosis not present

## 2013-08-08 DIAGNOSIS — Z79899 Other long term (current) drug therapy: Secondary | ICD-10-CM | POA: Diagnosis not present

## 2013-08-08 LAB — HEPATIC FUNCTION PANEL
ALT: 17 U/L (ref 0–53)
AST: 15 U/L (ref 0–37)
Albumin: 4.1 g/dL (ref 3.5–5.2)
Alkaline Phosphatase: 49 U/L (ref 39–117)
Bilirubin, Direct: 0.1 mg/dL (ref 0.0–0.3)
Indirect Bilirubin: 0.5 mg/dL (ref 0.2–1.2)
Total Bilirubin: 0.6 mg/dL (ref 0.2–1.2)
Total Protein: 6.4 g/dL (ref 6.0–8.3)

## 2013-08-08 LAB — LIPID PANEL
Cholesterol: 111 mg/dL (ref 0–200)
HDL: 50 mg/dL (ref >39)
LDL Cholesterol: 51 mg/dL (ref 0–99)
Total CHOL/HDL Ratio: 2.2 Ratio
Triglycerides: 48 mg/dL (ref <150)
VLDL: 10 mg/dL (ref 0–40)

## 2013-08-10 ENCOUNTER — Other Ambulatory Visit: Payer: Self-pay | Admitting: Cardiovascular Disease

## 2013-08-13 ENCOUNTER — Other Ambulatory Visit: Payer: Self-pay | Admitting: *Deleted

## 2013-08-13 ENCOUNTER — Encounter: Payer: Self-pay | Admitting: *Deleted

## 2013-08-13 MED ORDER — AMLODIPINE BESYLATE 10 MG PO TABS: 10.0000 mg | ORAL_TABLET | Freq: Every day | ORAL | Status: DC

## 2013-08-13 MED ORDER — VALSARTAN-HYDROCHLOROTHIAZIDE 320-25 MG PO TABS: 1.0000 | ORAL_TABLET | Freq: Every day | ORAL | Status: DC

## 2013-08-13 MED ORDER — RIVAROXABAN 15 MG PO TABS: 15.0000 mg | ORAL_TABLET | Freq: Every day | ORAL | Status: DC

## 2013-08-13 NOTE — Telephone Encounter (Signed)
Rx was sent to pharmacy electronically. 

## 2013-10-23 ENCOUNTER — Telehealth: Payer: Self-pay | Admitting: Cardiovascular Disease

## 2013-10-23 NOTE — Telephone Encounter (Signed)
Message sent to Dr.Berry for advice. 

## 2013-10-23 NOTE — Telephone Encounter (Signed)
Okay to stop his Xarelto 2 days prior to his dental procedure

## 2013-10-23 NOTE — Telephone Encounter (Signed)
Patient is scheduled for tooth extraction on Monday of next week.  Does he need to hold his Xarelto prior to the procedure?

## 2013-10-25 NOTE — Telephone Encounter (Signed)
Spoke with Sherri at Dr [employer] office and gave info.

## 2013-11-08 ENCOUNTER — Encounter (HOSPITAL_COMMUNITY): Payer: Self-pay | Admitting: *Deleted

## 2013-11-16 ENCOUNTER — Ambulatory Visit (HOSPITAL_COMMUNITY)
Admission: RE | Admit: 2013-11-16 | Discharge: 2013-11-16 | Disposition: A | Discharge status: Home / Self Care | Payer: Medicare Other | Source: Ambulatory Visit | Attending: Cardiology | Admitting: Cardiology

## 2013-11-16 DIAGNOSIS — I6529 Occlusion and stenosis of unspecified carotid artery: Secondary | ICD-10-CM

## 2013-11-16 DIAGNOSIS — I6523 Occlusion and stenosis of bilateral carotid arteries: Secondary | ICD-10-CM | POA: Insufficient documentation

## 2013-11-16 NOTE — Progress Notes (Signed)
Carotid Duplex Completed. °Brianna L Mazza,RVT °

## 2013-11-29 ENCOUNTER — Other Ambulatory Visit: Payer: Self-pay | Admitting: *Deleted

## 2013-11-29 DIAGNOSIS — I739 Peripheral vascular disease, unspecified: Secondary | ICD-10-CM

## 2013-11-29 DIAGNOSIS — I779 Disorder of arteries and arterioles, unspecified: Secondary | ICD-10-CM

## 2014-01-09 ENCOUNTER — Other Ambulatory Visit: Payer: Self-pay | Admitting: Cardiovascular Disease

## 2014-01-09 NOTE — Telephone Encounter (Signed)
Rx has been sent to the pharmacy electronically. ° °

## 2014-01-25 ENCOUNTER — Encounter: Payer: Self-pay | Admitting: Cardiology

## 2014-01-25 ENCOUNTER — Ambulatory Visit (INDEPENDENT_AMBULATORY_CARE_PROVIDER_SITE_OTHER): Payer: Medicare Other | Admitting: Cardiology

## 2014-01-25 VITALS — BP 140/50 | HR 58 | Ht 70.5 in | Wt 172.3 lb

## 2014-01-25 DIAGNOSIS — N183 Chronic kidney disease, stage 3 unspecified: Secondary | ICD-10-CM

## 2014-01-25 DIAGNOSIS — I6529 Occlusion and stenosis of unspecified carotid artery: Secondary | ICD-10-CM | POA: Diagnosis not present

## 2014-01-25 DIAGNOSIS — I2510 Atherosclerotic heart disease of native coronary artery without angina pectoris: Secondary | ICD-10-CM

## 2014-01-25 DIAGNOSIS — I4510 Unspecified right bundle-branch block: Secondary | ICD-10-CM

## 2014-01-25 DIAGNOSIS — I10 Essential (primary) hypertension: Secondary | ICD-10-CM | POA: Diagnosis not present

## 2014-01-25 DIAGNOSIS — N189 Chronic kidney disease, unspecified: Secondary | ICD-10-CM

## 2014-01-25 LAB — BASIC METABOLIC PANEL
BUN: 30 mg/dL — ABNORMAL HIGH (ref 6–23)
CO2: 23 mEq/L (ref 19–32)
Calcium: 9.2 mg/dL (ref 8.4–10.5)
Chloride: 105 mEq/L (ref 96–112)
Creat: 1.9 mg/dL — ABNORMAL HIGH (ref 0.50–1.35)
Glucose, Bld: 273 mg/dL — ABNORMAL HIGH (ref 70–99)
Potassium: 4.9 mEq/L (ref 3.5–5.3)
Sodium: 137 mEq/L (ref 135–145)

## 2014-01-25 MED ORDER — ATORVASTATIN CALCIUM 20 MG PO TABS: 20.0000 mg | ORAL_TABLET | Freq: Every day | ORAL | Status: DC

## 2014-01-25 NOTE — Assessment & Plan Note (Signed)
No claudication.  ?

## 2014-01-25 NOTE — Assessment & Plan Note (Signed)
Holding NSR 

## 2014-01-25 NOTE — Patient Instructions (Addendum)
Corine ShelterLuke Kilroy, [place]PA-C, has ordered the following test(s) to be done: 1. Blood work - to be done today. [facility] lab is on the first floor of this building.  Your physician has recommended making the following medication changes: STOP Crestor START Atorvastatin (Lipitor) 20 mg - take 1 table at bedtime. A prescription has already been sent to your pharmacy.  Corine ShelterLuke Kilroy, [place]-C, wants you to follow-up in 6 months with Dr Allyson SabalBerry. You will receive a reminder letter in the mail one months in advance. If you don't receive a letter, please call our office to schedule the follow-up appointment.  Please see your primary care physician about getting your flu vaccine.  Medication samples have been provided to the patient.  Drug name: Xarelto 15 mg Qty: 42 tablets  LOT: [id number] Exp.Date: 07/2015  Ian Bushmanruitt, [place] 10:41 AM 01/25/2014

## 2014-01-25 NOTE — Assessment & Plan Note (Signed)
Check BMP today,

## 2014-01-25 NOTE — Assessment & Plan Note (Signed)
He would like to try Lipitor secondary to increased Crestor cost

## 2014-01-25 NOTE — Assessment & Plan Note (Signed)
No angina 

## 2014-01-25 NOTE — Assessment & Plan Note (Signed)
Controlled.  

## 2014-01-25 NOTE — Progress Notes (Signed)
01/25/2014 Randye LoboBobby L Edmunds   10/17/1929  [id number]  Primary Physician Lenise HeraldMANN, BENJAMIN, PA-C Primary Cardiologist: Dr Allyson SabalBerry  HPI:  80 year old thin-appearing, married, Caucasian male, father of 2 and grandfather to 4 grandchildren, with a history of CAD, status post coronary artery bypass grafting June 2010 with a LIMA to his LAD, a vein to an obtuse marginal branch, a diagonal branch, and right coronary artery. He had a Myoview performed October 01, 2011, that showed diaphragmatic attenuation without ischemia. An 2D echo performed on May 31, 2012, revealed normal LV systolic function, grade 1 diastolic dysfunction without significant valvular abnormalities         Other problems include hypertension, stage 3 CRI, hyperlipidemia, and diet controlled diabetes as well as paroxysmal AFib without recurrence. He is on renal adjusted Xarelto.  He has known peripheral vascular occlusive disease and an occluded left carotid, mild right ICA stenosis neurologically asymptomatic. He had a  left renal artery stent in the past, and an atherectomy on his right popliteal artery. However, followup Dopplers and angiography have shown his popliteal artery to be occluded though he doesn't currently have life style limiting claudication. His carotid Dopplers and renal Dopplers have remained stable.             He is here for a 6 month check up. He denies any unusual dyspnea or chest pain. His legs get "tired" but he is able to do what he wants. He asked for a Crestor substitute as the price of Crestor went up for him.     Current Outpatient Prescriptions  Medication Sig Dispense Refill  . amLODipine (NORVASC) 10 MG tablet Take 1 tablet (10 mg total) by mouth daily. 30 tablet 6  . cilostazol (PLETAL) 50 MG tablet Take 2 tablets by mouth 2 (two) times daily.    [name]. escitalopram (LEXAPRO) 10 MG tablet Take 10 mg by mouth daily.    [name]. levothyroxine (SYNTHROID, LEVOTHROID) 88 MCG tablet Take 88 mcg by mouth daily before  breakfast.    . metoprolol tartrate (LOPRESSOR) 25 MG tablet TAKE (1) TABLET BY MOUTH TWICE DAILY. 60 tablet 3  . Rivaroxaban (XARELTO) 15 MG TABS tablet Take 1 tablet (15 mg total) by mouth daily with supper. 30 tablet 5  . valsartan-hydrochlorothiazide (DIOVAN-HCT) 320-25 MG per tablet Take 1 tablet by mouth daily. 30 tablet 6  . atorvastatin (LIPITOR) 20 MG tablet Take 1 tablet (20 mg total) by mouth daily. 30 tablet 11   No current facility-administered medications for this visit.    No Known Allergies  History   Social History  . Marital Status: Married    Spouse Name: N/A    Number of Children: 2  . Years of Education: N/A   Occupational History  . retired    Social History Main Topics  . Smoking status: Former Smoker    Types: Cigarettes    Quit date: 07/28/2008  . Smokeless tobacco: Not on file  . Alcohol Use: No  . Drug Use: Not on file  . Sexual Activity: Not on file   Other Topics Concern  . Not on file   Social History Narrative     Review of Systems: General: negative for chills, fever, night sweats or weight changes.  Recent URI, nasal congestion, no fever Cardiovascular: negative for chest pain, dyspnea on exertion, edema, orthopnea, palpitations, paroxysmal nocturnal dyspnea or shortness of breath Dermatological: negative for rash Respiratory: negative for cough or wheezing Urologic: negative for hematuria Abdominal: negative for nausea,  vomiting, diarrhea, bright red blood per rectum, melena, or hematemesis Neurologic: negative for visual changes, syncope, or dizziness All other systems reviewed and are otherwise negative except as noted above.    Blood pressure 140/50, pulse 58, height 5' 10.5" (1.791 m), weight 172 lb 4.8 oz (78.155 kg).  General appearance: alert, cooperative and no distress Neck: no JVD and bilat bruit Lungs: clear to auscultation bilaterally Heart: regular rate and rhythm  EKG NSR, SB RBBB  ASSESSMENT AND PLAN:   CAD-  CABG 6/10, low risk Myoview 8/13 No angina  Dyslipidemia He would like to try Lipitor secondary to increased Crestor cost  Peripheral vascular disease No claudication  PAF (paroxysmal atrial fibrillation) Holding NSR  Chronic renal insufficiency, stage III (moderate) Check BMP today,   Chronic anticoagulation Renal dose adjusted Xarelto  Hypertension Controlled  RBBB   PLAN  I changed Mr Damewood's Crestor to Lipitor 20 mg. I'll check a BMP today, last SCr 1.58 2014. He can see Dr Allyson SabalBerry in 6 months.  Hattie Aguinaldo KPA-C 01/25/2014 10:36 AM

## 2014-01-25 NOTE — Assessment & Plan Note (Signed)
Renal dose adjusted Xarelto

## 2014-01-30 ENCOUNTER — Other Ambulatory Visit: Payer: Self-pay | Admitting: *Deleted

## 2014-01-30 DIAGNOSIS — N289 Disorder of kidney and ureter, unspecified: Secondary | ICD-10-CM

## 2014-02-14 ENCOUNTER — Telehealth: Payer: Self-pay | Admitting: Cardiology

## 2014-02-14 DIAGNOSIS — I10 Essential (primary) hypertension: Secondary | ICD-10-CM | POA: Diagnosis not present

## 2014-02-14 LAB — BASIC METABOLIC PANEL WITH GFR
BUN: 35 mg/dL — ABNORMAL HIGH (ref 6–23)
CO2: 22 mEq/L (ref 19–32)
Calcium: 8.9 mg/dL (ref 8.4–10.5)
Chloride: 103 mEq/L (ref 96–112)
Creat: 1.63 mg/dL — ABNORMAL HIGH (ref 0.50–1.35)
GFR, Est African American: 45 mL/min — ABNORMAL LOW
GFR, Est Non African American: 39 mL/min — ABNORMAL LOW
Glucose, Bld: 331 mg/dL — ABNORMAL HIGH (ref 70–99)
Potassium: 4.8 mEq/L (ref 3.5–5.3)
Sodium: 134 mEq/L — ABNORMAL LOW (ref 135–145)

## 2014-02-14 NOTE — Telephone Encounter (Signed)
Lab orders placed.  

## 2014-04-01 DIAGNOSIS — I10 Essential (primary) hypertension: Secondary | ICD-10-CM | POA: Diagnosis not present

## 2014-04-01 DIAGNOSIS — M779 Enthesopathy, unspecified: Secondary | ICD-10-CM | POA: Diagnosis not present

## 2014-04-01 DIAGNOSIS — Z0000 Encounter for general adult medical examination without abnormal findings: Secondary | ICD-10-CM | POA: Diagnosis not present

## 2014-04-01 DIAGNOSIS — E119 Type 2 diabetes mellitus without complications: Secondary | ICD-10-CM | POA: Diagnosis not present

## 2014-04-01 DIAGNOSIS — Z0001 Encounter for general adult medical examination with abnormal findings: Secondary | ICD-10-CM | POA: Diagnosis not present

## 2014-04-01 DIAGNOSIS — E063 Autoimmune thyroiditis: Secondary | ICD-10-CM | POA: Diagnosis not present

## 2014-04-01 DIAGNOSIS — D539 Nutritional anemia, unspecified: Secondary | ICD-10-CM | POA: Diagnosis not present

## 2014-04-01 DIAGNOSIS — Z6823 Body mass index (BMI) 23.0-23.9, adult: Secondary | ICD-10-CM | POA: Diagnosis not present

## 2014-04-03 DIAGNOSIS — Z0001 Encounter for general adult medical examination with abnormal findings: Secondary | ICD-10-CM | POA: Diagnosis not present

## 2014-04-03 DIAGNOSIS — I10 Essential (primary) hypertension: Secondary | ICD-10-CM | POA: Diagnosis not present

## 2014-04-03 DIAGNOSIS — E119 Type 2 diabetes mellitus without complications: Secondary | ICD-10-CM | POA: Diagnosis not present

## 2014-04-03 DIAGNOSIS — Z6823 Body mass index (BMI) 23.0-23.9, adult: Secondary | ICD-10-CM | POA: Diagnosis not present

## 2014-04-03 DIAGNOSIS — M779 Enthesopathy, unspecified: Secondary | ICD-10-CM | POA: Diagnosis not present

## 2014-04-03 DIAGNOSIS — E063 Autoimmune thyroiditis: Secondary | ICD-10-CM | POA: Diagnosis not present

## 2014-04-04 ENCOUNTER — Other Ambulatory Visit: Payer: Self-pay | Admitting: Cardiovascular Disease

## 2014-04-05 ENCOUNTER — Other Ambulatory Visit: Payer: Self-pay | Admitting: Pharmacist Clinician (PhC)/ Clinical Pharmacy Specialist

## 2014-04-05 MED ORDER — RIVAROXABAN 15 MG PO TABS: ORAL_TABLET | ORAL | Status: DC

## 2014-04-12 ENCOUNTER — Other Ambulatory Visit: Payer: Self-pay | Admitting: Cardiovascular Disease

## 2014-04-12 NOTE — Telephone Encounter (Signed)
Rx(s) sent to pharmacy electronically.  

## 2014-06-10 ENCOUNTER — Other Ambulatory Visit: Payer: Self-pay | Admitting: Cardiovascular Disease

## 2014-07-19 ENCOUNTER — Encounter: Payer: Self-pay | Admitting: Cardiovascular Disease

## 2014-07-19 ENCOUNTER — Ambulatory Visit (INDEPENDENT_AMBULATORY_CARE_PROVIDER_SITE_OTHER): Payer: Medicare Other | Admitting: Cardiovascular Disease

## 2014-07-19 VITALS — BP 124/58 | HR 56 | Ht 70.0 in | Wt 170.0 lb

## 2014-07-19 DIAGNOSIS — E785 Hyperlipidemia, unspecified: Secondary | ICD-10-CM | POA: Diagnosis not present

## 2014-07-19 DIAGNOSIS — I2510 Atherosclerotic heart disease of native coronary artery without angina pectoris: Secondary | ICD-10-CM | POA: Diagnosis not present

## 2014-07-19 DIAGNOSIS — I10 Essential (primary) hypertension: Secondary | ICD-10-CM | POA: Diagnosis not present

## 2014-07-19 DIAGNOSIS — I480 Paroxysmal atrial fibrillation: Secondary | ICD-10-CM

## 2014-07-19 DIAGNOSIS — I739 Peripheral vascular disease, unspecified: Secondary | ICD-10-CM | POA: Diagnosis not present

## 2014-07-19 NOTE — Progress Notes (Signed)
07/19/2014 Scott [name]   1929-08-05  [id number]  Primary Physician Lenise Herald, PA-C Primary Cardiologist: Runell Gess MD [name]   HPI:  The patient returns today for followup. He is an 81 year old thin-appearing married Caucasian male, father of 2 and grandfather to 4 grandchildren, whom I last saw in the office a year ago. He has a history of CAD, status post coronary artery bypass grafting June 2010 with a LIMA to his LAD, a vein to an obtuse marginal branch, a diagonal branch, and right coronary artery.   His other problems include hypertension, hyperlipidemia, and newly diagnosed non-insulin-requiring diabetes as well as paroxysmal AFib without recurrence. He has known peripheral vascular occlusive disease and an occluded left carotid, mild right ICA stenosis neurologically asymptomatic. I stented his left renal artery and performed atherectomy on his right popliteal artery. However, followup Dopplers and angiography have shown his popliteal artery to be occluded. His carotid Dopplers and renal Dopplers have remained stable. When I saw him a month ago he did complain of some mild increasing shortness of breath but denied chest pain. He had a Myoview performed October 01, 2011, that showed diaphragmatic attenuation without ischemia. Based on his AFib and high CHADS2 score, I elected to systemically anticoagulate him with renally dosed Xarelto and increased his metoprolol from 12.5 to 25 mg p.o. b.i.d. He has had no recurrent episodes of PAF. A 2D echo performed on May 31, 2012, revealed normal LV systolic function, grade 1 diastolic dysfunction without significant valvular abnormalities.  Since I saw him a year ago has remained clinically stable. He did see Eli Phillips in the office 01/25/14.   Current Outpatient Prescriptions  Medication Sig Dispense Refill  . amLODipine (NORVASC) 10 MG tablet TAKE ONE TABLET BY MOUTH ONCE DAILY. 30 tablet 4  .  atorvastatin (LIPITOR) 20 MG tablet Take 1 tablet (20 mg total) by mouth daily. 30 tablet 11  . cilostazol (PLETAL) 50 MG tablet Take 2 tablets by mouth 2 (two) times daily.    [name] escitalopram (LEXAPRO) 10 MG tablet Take 10 mg by mouth daily.    [name] levothyroxine (SYNTHROID, LEVOTHROID) 100 MCG tablet     . metoprolol tartrate (LOPRESSOR) 25 MG tablet TAKE (1) TABLET BY MOUTH TWICE DAILY. 60 tablet 1  . Rivaroxaban (XARELTO) 15 MG TABS tablet TAKE ONE TABLET BY MOUTH ONCE DAILY WITH SUPPER. 30 tablet 5  . valsartan-hydrochlorothiazide (DIOVAN-HCT) 320-25 MG per tablet TAKE ONE TABLET BY MOUTH ONCE DAILY. 30 tablet 4   No current facility-administered medications for this visit.    No Known Allergies  History   Social History  . Marital Status: Married    Spouse Name: N/A  . Number of Children: 2  . Years of Education: N/A   Occupational History  . retired    Social History Main Topics  . Smoking status: Former Smoker -- 1.00 packs/day    Types: Cigarettes    Quit date: 07/28/2008  . Smokeless tobacco: Not on file  . Alcohol Use: No  . Drug Use: Not on file  . Sexual Activity: Not on file   Other Topics Concern  . Not on file   Social History Narrative     Review of Systems: General: negative for chills, fever, night sweats or weight changes.  Cardiovascular: negative for chest pain, dyspnea on exertion, edema, orthopnea, palpitations, paroxysmal nocturnal dyspnea or shortness of breath Dermatological: negative for rash Respiratory: negative for cough or wheezing Urologic: negative for hematuria  Abdominal: negative for nausea, vomiting, diarrhea, bright red blood per rectum, melena, or hematemesis Neurologic: negative for visual changes, syncope, or dizziness All other systems reviewed and are otherwise negative except as noted above.    Blood pressure 124/58, pulse 56, height  (1.778 m), weight 170 lb (77.111 kg).  General appearance: alert and no  distress Neck: no adenopathy, no carotid bruit, no JVD, supple, symmetrical, trachea midline and thyroid not enlarged, symmetric, no tenderness/mass/nodules Lungs: clear to auscultation bilaterally Heart: regular rate and rhythm, S1, S2 normal, no murmur, click, rub or gallop  EKG sinus bradycardia 56 with bipolar branch block. I personally reviewed this EKG  ASSESSMENT AND PLAN:   Peripheral vascular disease History of carotid artery disease disease with a known occluded left internal artery and mild right ICA stenosis which we have been thought by duplex ultrasound. He remains neurologically asymptomatic  Hypertension History of hypertension blood pressure measured at 124/58. He is on Diovan, Hydrocort thiazide, amlodipine and metoprolol. Continue current meds at current dosing  Dyslipidemia History of hyperlipidemia on atorvastatin 20 mg a day followed by his PCP  CAD- CABG 6/10, low risk Myoview 8/13 History of coronary artery disease status post coronary artery bypass grafting June 2010 with a LIMA to his LAD, vein graft to an obtuse marginal branch, diagonal branch and right coronary artery. He denies chest pain or shortness of breath. His last Myoview stress test performed 10/01/11 showed diaphragmatic attenuation without ischemia  PAF (paroxysmal atrial fibrillation) History of PAF on Xarelto  oral anticoagulation.      Runell Gess MD FACP,FACC,FAHA, [facility] 07/19/2014 12:31 PM

## 2014-07-19 NOTE — Assessment & Plan Note (Signed)
History of PAF on Xarelto oral anticoagulation. 

## 2014-07-19 NOTE — Assessment & Plan Note (Signed)
History of hypertension blood pressure measured at 124/58. He is on Diovan, Hydrocort thiazide, amlodipine and metoprolol. Continue current meds at current dosing

## 2014-07-19 NOTE — Assessment & Plan Note (Signed)
History of coronary artery disease status post coronary artery bypass grafting June 2010 with a LIMA to his LAD, vein graft to an obtuse marginal branch, diagonal branch and right coronary artery. He denies chest pain or shortness of breath. His last Myoview stress test performed 10/01/11 showed diaphragmatic attenuation without ischemia

## 2014-07-19 NOTE — Patient Instructions (Signed)
We request that you follow-up in: 6 months with an extender and in 1 year with Dr Berry  You will receive a reminder letter in the mail two months in advance. If you don't receive a letter, please call our office to schedule the follow-up appointment.   

## 2014-07-19 NOTE — Assessment & Plan Note (Signed)
History of hyperlipidemia on atorvastatin 20 mg a day followed by his PCP 

## 2014-07-19 NOTE — Assessment & Plan Note (Signed)
History of carotid artery disease disease with a known occluded left internal artery and mild right ICA stenosis which we have been thought by duplex ultrasound. He remains neurologically asymptomatic

## 2014-08-09 ENCOUNTER — Other Ambulatory Visit: Payer: Self-pay | Admitting: Cardiovascular Disease

## 2014-08-09 NOTE — Telephone Encounter (Signed)
Rx(s) sent to pharmacy electronically.  

## 2014-08-22 ENCOUNTER — Encounter: Payer: Self-pay | Admitting: Cardiovascular Disease

## 2014-09-09 ENCOUNTER — Other Ambulatory Visit: Payer: Self-pay | Admitting: Cardiovascular Disease

## 2014-09-11 DIAGNOSIS — Z1389 Encounter for screening for other disorder: Secondary | ICD-10-CM | POA: Diagnosis not present

## 2014-09-11 DIAGNOSIS — Z6823 Body mass index (BMI) 23.0-23.9, adult: Secondary | ICD-10-CM | POA: Diagnosis not present

## 2014-09-11 DIAGNOSIS — E063 Autoimmune thyroiditis: Secondary | ICD-10-CM | POA: Diagnosis not present

## 2014-09-11 DIAGNOSIS — E119 Type 2 diabetes mellitus without complications: Secondary | ICD-10-CM | POA: Diagnosis not present

## 2014-10-10 DEATH — deceased

## 2014-11-27 DIAGNOSIS — E119 Type 2 diabetes mellitus without complications: Secondary | ICD-10-CM | POA: Diagnosis not present

## 2014-12-31 ENCOUNTER — Other Ambulatory Visit: Payer: Self-pay | Admitting: Cardiovascular Disease

## 2014-12-31 DIAGNOSIS — I6523 Occlusion and stenosis of bilateral carotid arteries: Secondary | ICD-10-CM

## 2015-01-09 ENCOUNTER — Encounter: Payer: Self-pay | Admitting: Physician Assistant

## 2015-01-09 ENCOUNTER — Ambulatory Visit (HOSPITAL_COMMUNITY)
Admission: RE | Admit: 2015-01-09 | Discharge: 2015-01-09 | Disposition: A | Discharge status: Home / Self Care | Payer: Medicare Other | Source: Ambulatory Visit | Attending: Cardiovascular Disease | Admitting: Cardiovascular Disease

## 2015-01-09 ENCOUNTER — Ambulatory Visit (INDEPENDENT_AMBULATORY_CARE_PROVIDER_SITE_OTHER): Payer: Medicare Other | Admitting: Physician Assistant

## 2015-01-09 VITALS — BP 124/50 | HR 64 | Ht 70.0 in | Wt 170.0 lb

## 2015-01-09 DIAGNOSIS — Z7901 Long term (current) use of anticoagulants: Secondary | ICD-10-CM | POA: Diagnosis not present

## 2015-01-09 DIAGNOSIS — I2510 Atherosclerotic heart disease of native coronary artery without angina pectoris: Secondary | ICD-10-CM

## 2015-01-09 DIAGNOSIS — I739 Peripheral vascular disease, unspecified: Secondary | ICD-10-CM

## 2015-01-09 DIAGNOSIS — I480 Paroxysmal atrial fibrillation: Secondary | ICD-10-CM

## 2015-01-09 DIAGNOSIS — I10 Essential (primary) hypertension: Secondary | ICD-10-CM | POA: Diagnosis not present

## 2015-01-09 DIAGNOSIS — I6523 Occlusion and stenosis of bilateral carotid arteries: Secondary | ICD-10-CM

## 2015-01-09 DIAGNOSIS — E785 Hyperlipidemia, unspecified: Secondary | ICD-10-CM | POA: Insufficient documentation

## 2015-01-09 NOTE — Progress Notes (Signed)
Patient ID: Scott [name], male   DOB: May 04, 1929, 81 y.o.   MRN: [id number]    Date:  01/09/2015   ID:  Scott [name], DOB May 04, 1929, MRN [id number]  PCP:  Lenise HeraldMANN, BENJAMIN, PA-C  Primary Cardiologist:  Allyson SabalBerry   Complaint: Six-month follow-up   History of Present Illness: Scott [name] is a 81 y.o. married Caucasian male, father of 2 and grandfather to 4 grandchildren. He has a history of CAD, status post coronary artery bypass grafting June 2010 with a LIMA to his LAD, a vein to an obtuse marginal branch, a diagonal branch, and right coronary artery.   His other problems include hypertension, hyperlipidemia, and newly diagnosed non-insulin-requiring diabetes as well as paroxysmal AFib without recurrence. He has known peripheral vascular occlusive disease and an occluded left carotid, mild right ICA stenosis neurologically asymptomatic. Dr. Allyson SabalBerry stented his left renal artery and performed atherectomy on his right popliteal artery. However, followup Dopplers and angiography have shown his popliteal artery to be occluded. His carotid Dopplers and renal Dopplers have remained stable.  He had a Myoview performed October 01, 2011, that showed diaphragmatic attenuation without ischemia. Based on his AFib and high CHADS2 score, he was placed on renally dosed Xarelto and increased his metoprolol from 12.5 to 25 mg p.o. b.i.d. He has had no recurrent episodes of PAF. A 2D echo performed on May 31, 2012, revealed normal LV systolic function, grade 1 diastolic dysfunction without significant valvular abnormalities.  Patient is here for six-month evaluation. He just had carotid Dopplers before this appointment. He reports doing very well and has no particular complaints. 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  He is alert to our did nip him on the right hand at the base of the thumb.  He has some mild lower extremity edema which improves by  morning.  The patient currently denies nausea, vomiting, fever, chest pain, shortness of breath, orthopnea, dizziness, PND, cough, congestion, abdominal pain, hematochezia, melena,  claudication.  Wt Readings from Last 3 Encounters:  01/09/15 170 lb (77.111 kg)  07/19/14 170 lb (77.111 kg)  01/25/14 172 lb 4.8 oz (78.155 kg)     Past Medical History  Diagnosis Date  . Coronary artery disease 2010    CABG  . Shingles April 2012  . Hypothyroidism   . Peripheral vascular disease (HCC) 08/01/10    Carotid doppler: 12/26/09 lower arterial doppler  . Hypertension 12/26/09    renal doppler  . Dyslipidemia   . PAF (paroxysmal atrial fibrillation) (HCC)   . Chronic renal insufficiency, stage III (moderate)   . Chronic anticoagulation   . RBBB     Current Outpatient Prescriptions  Medication Sig Dispense Refill  . amLODipine (NORVASC) 10 MG tablet TAKE ONE TABLET BY MOUTH ONCE DAILY. 30 tablet 5  . atorvastatin (LIPITOR) 20 MG tablet Take 1 tablet (20 mg total) by mouth daily. 30 tablet 11  . cilostazol (PLETAL) 50 MG tablet TAKE (2) TABLETS BY MOUTH TWICE DAILY. 120 tablet 5  . escitalopram (LEXAPRO) 10 MG tablet Take 10 mg by mouth daily.    [name]. levothyroxine (SYNTHROID, LEVOTHROID) 100 MCG tablet     . metoprolol tartrate (LOPRESSOR) 25 MG tablet TAKE (1) TABLET BY MOUTH TWICE DAILY. 60 tablet 5  . valsartan-hydrochlorothiazide (DIOVAN-HCT) 320-25 MG per tablet TAKE ONE TABLET BY MOUTH ONCE DAILY. 30 tablet 5  . XARELTO 15 MG TABS tablet TAKE ONE TABLET BY MOUTH  ONCE DAILY WITH SUPPER. 30 tablet 5   No current facility-administered medications for this visit.    Allergies:   No Known Allergies  Social History:  The patient  reports that he quit smoking about 6 years ago. His smoking use included Cigarettes. He smoked 1.00 pack per day. He does not have any smokeless tobacco history on file. He reports that he does not drink alcohol.   Family history:   Family History  Problem  Relation Age of Onset  . CVA Mother 61  . Stroke Mother   . Diabetes Father   . CAD Brother     ROS:  Please see the history of present illness.  All other systems reviewed and negative.   PHYSICAL EXAM: VS:  BP 124/50 mmHg  Pulse 64  Ht  (1.778 m)  Wt 170 lb (77.111 kg)  BMI 24.39 kg/m2 Well nourished, well developed, in no acute distress HEENT: Pupils are equal round react to light accommodation extraocular movements are intact.  Neck: no JVDNo cervical lymphadenopathy. A lateral carotid bruits Cardiac: Regular rate and rhythm 1/6 systolic murmur at the base Lungs:  clear to auscultation bilaterally, no wheezing, rhonchi or rales Abd: soft, nontender, positive bowel sounds all quadrants, no hepatosplenomegaly Ext: 1+ left lower extremity edema trace on the right.  2+ radial and dorsalis pedis pulses. Skin: warm and dry Neuro:  Grossly normal     ASSESSMENT AND PLAN: 1. Dyslipidemia   2. PAF (paroxysmal atrial fibrillation) (HCC)   3. Chronic anticoagulation   4. Essential hypertension   5. Peripheral vascular disease (HCC)   6. Coronary artery disease involving native coronary artery of native heart without angina pectoris      Peripheral vascular disease We just a carotid Dopplers this morning. We'll review the study and let him know if there's anything we need to do. Got good distal pulses. Hypertension  Blood pressure well-controlled. No changes in medications Dyslipidemia  Continue Lipitor Coronary artery disease-coronary bypass grafting June 2010 low risk Myoview August 2013  No complaints of chest pain. He stays very active and has been raking a lot of leaves lately. Paroxysmal atrial fibrillation:    Continue Xarelto

## 2015-01-09 NOTE — Patient Instructions (Signed)
Wilburt FinlayBryan Hager, PA-C, recommends that you schedule a follow-up appointment in 6 months with Dr Allyson SabalBerry. You will receive a reminder letter in the mail two months in advance. If you don't receive a letter, please call our office to schedule the follow-up appointment.  If you need a refill on your cardiac medications before your next appointment, please call your pharmacy.

## 2015-01-10 ENCOUNTER — Other Ambulatory Visit: Payer: Self-pay | Admitting: Cardiovascular Disease

## 2015-02-10 ENCOUNTER — Other Ambulatory Visit: Payer: Self-pay | Admitting: Cardiology

## 2015-02-10 ENCOUNTER — Other Ambulatory Visit: Payer: Self-pay | Admitting: Cardiovascular Disease

## 2015-02-11 NOTE — Telephone Encounter (Signed)
Rx request sent to pharmacy.  

## 2015-03-19 DIAGNOSIS — Z1389 Encounter for screening for other disorder: Secondary | ICD-10-CM | POA: Diagnosis not present

## 2015-03-19 DIAGNOSIS — M7551 Bursitis of right shoulder: Secondary | ICD-10-CM | POA: Diagnosis not present

## 2015-03-19 DIAGNOSIS — Z6823 Body mass index (BMI) 23.0-23.9, adult: Secondary | ICD-10-CM | POA: Diagnosis not present

## 2015-03-19 DIAGNOSIS — E1151 Type 2 diabetes mellitus with diabetic peripheral angiopathy without gangrene: Secondary | ICD-10-CM | POA: Diagnosis not present

## 2015-03-19 DIAGNOSIS — I10 Essential (primary) hypertension: Secondary | ICD-10-CM | POA: Diagnosis not present

## 2015-07-14 ENCOUNTER — Other Ambulatory Visit: Payer: Self-pay | Admitting: Cardiovascular Disease

## 2015-07-30 ENCOUNTER — Ambulatory Visit (INDEPENDENT_AMBULATORY_CARE_PROVIDER_SITE_OTHER): Payer: Medicare Other | Admitting: Cardiovascular Disease

## 2015-07-30 ENCOUNTER — Encounter: Payer: Self-pay | Admitting: Cardiovascular Disease

## 2015-07-30 VITALS — BP 134/50 | HR 59 | Ht 70.0 in | Wt 165.0 lb

## 2015-07-30 DIAGNOSIS — I739 Peripheral vascular disease, unspecified: Secondary | ICD-10-CM | POA: Diagnosis not present

## 2015-07-30 DIAGNOSIS — I2510 Atherosclerotic heart disease of native coronary artery without angina pectoris: Secondary | ICD-10-CM | POA: Diagnosis not present

## 2015-07-30 DIAGNOSIS — I480 Paroxysmal atrial fibrillation: Secondary | ICD-10-CM

## 2015-07-30 DIAGNOSIS — I10 Essential (primary) hypertension: Secondary | ICD-10-CM | POA: Diagnosis not present

## 2015-07-30 DIAGNOSIS — E785 Hyperlipidemia, unspecified: Secondary | ICD-10-CM

## 2015-07-30 DIAGNOSIS — I2583 Coronary atherosclerosis due to lipid rich plaque: Principal | ICD-10-CM

## 2015-07-30 NOTE — Assessment & Plan Note (Signed)
History of proximal atrial fibrillation maintaining sinus rhythm on beta blocker and Xarelto oral antibiotics and coagulation

## 2015-07-30 NOTE — Assessment & Plan Note (Signed)
History of hyperlipidemia on statin therapy followed by his PCP 

## 2015-07-30 NOTE — Assessment & Plan Note (Signed)
History of coronary artery disease status post coronary artery bypass grafting June 2010 with a LIMA to his LAD, vein to an obtuse marginal branch, diagonal branch and right coronary artery. Given Myoview performed 10/01/11 that showed diaphragmatic attenuation without ischemia. He denies chest pain or shortness of breath.

## 2015-07-30 NOTE — Patient Instructions (Signed)

## 2015-07-30 NOTE — Progress Notes (Signed)
07/30/2015 Randye LoboBobby L Fillion   [birth year]  [id number]  Primary Physician Lenise HeraldMANN, BENJAMIN, PA-C Primary Cardiologist: Runell GessJonathan J Eran Mistry MD Roseanne RenoFACP, FACC, FAHA, FSCAI  HPI:  The patient returns today for followup. He is an 82 year old thin-appearing married Caucasian male, father of 2 and grandfather to 4 grandchildren, whom I last saw in the office 07/19/14.[name]. He has a history of CAD, status post coronary artery bypass grafting June 2010 with a LIMA to his LAD, a vein to an obtuse marginal branch, a diagonal branch, and right coronary artery.   His other problems include hypertension, hyperlipidemia, and newly diagnosed non-insulin-requiring diabetes as well as paroxysmal AFib without recurrence. He has known peripheral vascular occlusive disease and an occluded left carotid, mild right ICA stenosis neurologically asymptomatic. I stented his left renal artery and performed atherectomy on his right popliteal artery. However, followup Dopplers and angiography have shown his popliteal artery to be occluded. His carotid Dopplers and renal Dopplers have remained stable. When I saw him a month ago he did complain of some mild increasing shortness of breath but denied chest pain. He had a Myoview performed October 01, 2011, that showed diaphragmatic attenuation without ischemia. Based on his AFib and high CHADS2 score, I elected to systemically anticoagulate him with renally dosed Xarelto and increased his metoprolol from 12.5 to 25 mg p.o. b.i.d. He has had no recurrent episodes of PAF. A 2D echo performed on May 31, 2012, revealed normal LV systolic function, grade 1 diastolic dysfunction without significant valvular abnormalities.since I saw him in the office either ago he denies chest pain, shortness of breath or episodes of PAF.   Current Outpatient Prescriptions  Medication Sig Dispense Refill  . amLODipine (NORVASC) 10 MG tablet TAKE ONE TABLET BY MOUTH ONCE DAILY. 30 tablet 6  . atorvastatin (LIPITOR) 20  MG tablet TAKE ONE TABLET BY MOUTH ONCE DAILY. 30 tablet 6  . cilostazol (PLETAL) 50 MG tablet TAKE (2) TABLETS BY MOUTH TWICE DAILY. 120 tablet 4  . escitalopram (LEXAPRO) 10 MG tablet Take 10 mg by mouth daily.    [name]. levothyroxine (SYNTHROID, LEVOTHROID) 100 MCG tablet     . metoprolol tartrate (LOPRESSOR) 25 MG tablet TAKE (1) TABLET BY MOUTH TWICE DAILY. 60 tablet 5  . Rivaroxaban (XARELTO) 15 MG TABS tablet Take 1 tablet (15 mg total) by mouth daily with supper. 30 tablet 1  . valsartan-hydrochlorothiazide (DIOVAN-HCT) 320-25 MG tablet TAKE ONE TABLET BY MOUTH ONCE DAILY. 30 tablet 6   No current facility-administered medications for this visit.    No Known Allergies  Social History   Social History  . Marital Status: Married    Spouse Name: N/A  . Number of Children: 2  . Years of Education: N/A   Occupational History  . retired    Social History Main Topics  . Smoking status: Former Smoker -- 1.00 packs/day    Types: Cigarettes    Quit date: 07/28/2008  . Smokeless tobacco: Not on file  . Alcohol Use: No  . Drug Use: Not on file  . Sexual Activity: Not on file   Other Topics Concern  . Not on file   Social History Narrative     Review of Systems: General: negative for chills, fever, night sweats or weight changes.  Cardiovascular: negative for chest pain, dyspnea on exertion, edema, orthopnea, palpitations, paroxysmal nocturnal dyspnea or shortness of breath Dermatological: negative for rash Respiratory: negative for cough or wheezing Urologic: negative for hematuria Abdominal: negative for nausea,  vomiting, diarrhea, bright red blood per rectum, melena, or hematemesis Neurologic: negative for visual changes, syncope, or dizziness All other systems reviewed and are otherwise negative except as noted above.    Blood pressure 134/50, pulse 59, height  (1.778 m), weight 165 lb (74.844 kg).  General appearance: alert and no distress Neck: no adenopathy, no  carotid bruit, no JVD, supple, symmetrical, trachea midline and thyroid not enlarged, symmetric, no tenderness/mass/nodules Lungs: clear to auscultation bilaterally Heart: regular rate and rhythm, S1, S2 normal, no murmur, click, rub or gallop Extremities: extremities normal, atraumatic, no cyanosis or edema  EKG sinus bradycardia at 59 with right bundle-branch block and left axis deviation. I personally reviewed his EKG  ASSESSMENT AND PLAN:   CAD- CABG 6/10, low risk Myoview 8/13 History of coronary artery disease status post coronary artery bypass grafting June 2010 with a LIMA to his LAD, vein to an obtuse marginal branch, diagonal branch and right coronary artery. Given Myoview performed 10/01/11 that showed diaphragmatic attenuation without ischemia. He denies chest pain or shortness of breath.  Peripheral vascular disease History history of peripheral arterial disease with known occluded left carotid and mild right ICA stenosis. I didn't stand his left renal artery in the past and performed atherectomy of his right popliteal artery which was shown to be occluded angiographically after that. He does complain of some lifestyle limiting claudication.  Hypertension History of hypertension blood pressure measures 134/50. He is on amlodipine, metoprolol, valsartan hydrochlorothiazide. Continue current meds at current dosing  PAF (paroxysmal atrial fibrillation) History of proximal atrial fibrillation maintaining sinus rhythm on beta blocker and Xarelto oral antibiotics and coagulation  Dyslipidemia History of hyperlipidemia on statin therapy followed by his PCP      Runell Gess MD [facility], [facility] 07/30/2015 12:14 PM

## 2015-07-30 NOTE — Assessment & Plan Note (Signed)
History history of peripheral arterial disease with known occluded left carotid and mild right ICA stenosis. I didn't stand his left renal artery in the past and performed atherectomy of his right popliteal artery which was shown to be occluded angiographically after that. He does complain of some lifestyle limiting claudication.

## 2015-07-30 NOTE — Assessment & Plan Note (Signed)
History of hypertension blood pressure measures 134/50. He is on amlodipine, metoprolol, valsartan hydrochlorothiazide. Continue current meds at current dosing

## 2015-07-31 ENCOUNTER — Other Ambulatory Visit: Payer: Self-pay | Admitting: Cardiovascular Disease

## 2015-07-31 NOTE — Telephone Encounter (Signed)
Rx(s) sent to pharmacy electronically.  

## 2015-10-13 ENCOUNTER — Other Ambulatory Visit: Payer: Self-pay | Admitting: Cardiovascular Disease

## 2015-11-27 ENCOUNTER — Other Ambulatory Visit: Payer: Self-pay | Admitting: Cardiovascular Disease

## 2015-11-27 ENCOUNTER — Other Ambulatory Visit: Payer: Self-pay | Admitting: Cardiology

## 2015-11-27 NOTE — Telephone Encounter (Signed)
REFILL 

## 2015-12-15 DIAGNOSIS — I10 Essential (primary) hypertension: Secondary | ICD-10-CM | POA: Diagnosis not present

## 2015-12-15 DIAGNOSIS — E063 Autoimmune thyroiditis: Secondary | ICD-10-CM | POA: Diagnosis not present

## 2015-12-15 DIAGNOSIS — Z1389 Encounter for screening for other disorder: Secondary | ICD-10-CM | POA: Diagnosis not present

## 2015-12-15 DIAGNOSIS — E669 Obesity, unspecified: Secondary | ICD-10-CM | POA: Diagnosis not present

## 2015-12-15 DIAGNOSIS — E782 Mixed hyperlipidemia: Secondary | ICD-10-CM | POA: Diagnosis not present

## 2015-12-15 DIAGNOSIS — Z23 Encounter for immunization: Secondary | ICD-10-CM | POA: Diagnosis not present

## 2015-12-15 DIAGNOSIS — E119 Type 2 diabetes mellitus without complications: Secondary | ICD-10-CM | POA: Diagnosis not present

## 2015-12-15 DIAGNOSIS — Z6823 Body mass index (BMI) 23.0-23.9, adult: Secondary | ICD-10-CM | POA: Diagnosis not present

## 2016-01-05 ENCOUNTER — Other Ambulatory Visit: Payer: Self-pay | Admitting: Cardiovascular Disease

## 2016-01-05 NOTE — Telephone Encounter (Signed)
REFILL 

## 2016-06-28 ENCOUNTER — Other Ambulatory Visit: Payer: Self-pay | Admitting: Cardiovascular Disease

## 2016-06-28 NOTE — Telephone Encounter (Signed)
Rx request sent to pharmacy.  

## 2016-07-12 ENCOUNTER — Other Ambulatory Visit: Payer: Self-pay | Admitting: Cardiovascular Disease

## 2016-07-12 NOTE — Telephone Encounter (Signed)
Rx request sent to pharmacy.  

## 2016-07-29 DIAGNOSIS — E11319 Type 2 diabetes mellitus with unspecified diabetic retinopathy without macular edema: Secondary | ICD-10-CM | POA: Diagnosis not present

## 2016-07-29 DIAGNOSIS — H43813 Vitreous degeneration, bilateral: Secondary | ICD-10-CM | POA: Diagnosis not present

## 2016-07-29 DIAGNOSIS — E119 Type 2 diabetes mellitus without complications: Secondary | ICD-10-CM | POA: Diagnosis not present

## 2016-07-29 DIAGNOSIS — H25813 Combined forms of age-related cataract, bilateral: Secondary | ICD-10-CM | POA: Diagnosis not present

## 2016-07-30 ENCOUNTER — Encounter: Payer: Self-pay | Admitting: Cardiovascular Disease

## 2016-07-30 ENCOUNTER — Ambulatory Visit (INDEPENDENT_AMBULATORY_CARE_PROVIDER_SITE_OTHER): Payer: Medicare Other | Admitting: Cardiovascular Disease

## 2016-07-30 VITALS — BP 125/74 | HR 111 | Ht 71.0 in | Wt 164.0 lb

## 2016-07-30 DIAGNOSIS — I480 Paroxysmal atrial fibrillation: Secondary | ICD-10-CM | POA: Diagnosis not present

## 2016-07-30 DIAGNOSIS — I10 Essential (primary) hypertension: Secondary | ICD-10-CM

## 2016-07-30 DIAGNOSIS — E785 Hyperlipidemia, unspecified: Secondary | ICD-10-CM | POA: Diagnosis not present

## 2016-07-30 MED ORDER — METOPROLOL TARTRATE 37.5 MG PO TABS: 37.5000 mg | ORAL_TABLET | Freq: Two times a day (BID) | ORAL | 6 refills | Status: DC

## 2016-07-30 NOTE — Assessment & Plan Note (Signed)
History of essential hypertension blood pressure measured 125/84. He is on amlodipine, metoprolol, valsartan and hydrochlorothiazide. I am going to Increase his Metoprolol because of an elevated ventricular response to atrial fibrillation.

## 2016-07-30 NOTE — Assessment & Plan Note (Signed)
History of hyperlipidemia on statin therapy. We will obtain a lipid and liver profile.

## 2016-07-30 NOTE — Progress Notes (Signed)
07/30/2016 Scott [name]   27-Dec-1929  [id number]  Primary Physician Shawnie Dapper, PA-C Primary Cardiologist: Runell Gess MD [name]  HPI:  The patient returns today for followup. He is an 83 year old thin-appearing married Caucasian male, father of 2 and grandfather to 4 grandchildren, whom I last saw in the office 07/30/15.Scott [name] He has a history of CAD, status post coronary artery bypass grafting June 2010 with a LIMA to his LAD, a vein to an obtuse marginal branch, a diagonal branch, and right coronary artery.   His other problems include hypertension, hyperlipidemia, and newly diagnosed non-insulin-requiring diabetes as well as paroxysmal AFib without recurrence. He has known peripheral vascular occlusive disease and an occluded left carotid, mild right ICA stenosis neurologically asymptomatic. I stented his left renal artery and performed atherectomy on his right popliteal artery. However, followup Dopplers and angiography have shown his popliteal artery to be occluded. His carotid Dopplers and renal Dopplers have remained stable. When I saw him a month ago he did complain of some mild increasing shortness of breath but denied chest pain. He had a Myoview performed October 01, 2011, that showed diaphragmatic attenuation without ischemia. Based on his AFib and high CHADS2 score, I elected to systemically anticoagulate him with renally dosed Xarelto and increased his metoprolol from 12.5 to 25 mg p.o. b.i.d. He has had no recurrent episodes of PAF. A 2D echo performed on May 31, 2012, revealed normal LV systolic function, grade 1 diastolic dysfunction without significant valvular abnormalities.since I saw him in the office A year ago,he denies chest pain, shortness of breath .    Current Outpatient Prescriptions  Medication Sig Dispense Refill  . amLODipine (NORVASC) 10 MG tablet TAKE ONE TABLET BY MOUTH ONCE DAILY. 30 tablet 6  . atorvastatin (LIPITOR) 20 MG tablet TAKE  ONE TABLET BY MOUTH ONCE DAILY. 30 tablet 7  . cilostazol (PLETAL) 50 MG tablet TAKE (2) TABLETS BY MOUTH TWICE DAILY. 120 tablet 7  . escitalopram (LEXAPRO) 10 MG tablet Take 10 mg by mouth daily.    Scott [name] levothyroxine (SYNTHROID, LEVOTHROID) 100 MCG tablet     . metoprolol tartrate 37.5 MG TABS Take 37.5 mg by mouth 2 (two) times daily. 60 tablet 6  . valsartan-hydrochlorothiazide (DIOVAN-HCT) 320-25 MG tablet TAKE ONE TABLET BY MOUTH ONCE DAILY. 30 tablet 10  . XARELTO 15 MG TABS tablet TAKE ONE TABLET BY MOUTH ONCE DAILY WITH SUPPER. 30 tablet 10   No current facility-administered medications for this visit.     No Known Allergies  Social History   Social History  . Marital status: Married    Spouse name: N/A  . Number of children: 2  . Years of education: N/A   Occupational History  . retired    Social History Main Topics  . Smoking status: Former Smoker    Packs/day: 1.00    Types: Cigarettes    Quit date: 07/28/2008  . Smokeless tobacco: Never Used  . Alcohol use No  . Drug use: Unknown  . Sexual activity: Not on file   Other Topics Concern  . Not on file   Social History Narrative  . No narrative on file     Review of Systems: General: negative for chills, fever, night sweats or weight changes.  Cardiovascular: negative for chest pain, dyspnea on exertion, edema, orthopnea, palpitations, paroxysmal nocturnal dyspnea or shortness of breath Dermatological: negative for rash Respiratory: negative for cough or wheezing Urologic: negative for hematuria Abdominal:  negative for nausea, vomiting, diarrhea, bright red blood per rectum, melena, or hematemesis Neurologic: negative for visual changes, syncope, or dizziness All other systems reviewed and are otherwise negative except as noted above.    Blood pressure 125/74, pulse (!) 111, height 5\' 11"  (1.803 m), weight 164 lb (74.4 kg).  General appearance: alert and no distress Neck: no adenopathy, no carotid bruit,  no JVD, supple, symmetrical, trachea midline and thyroid not enlarged, symmetric, no tenderness/mass/nodules Lungs: clear to auscultation bilaterally Heart: irregularly irregular rhythm Extremities: extremities normal, atraumatic, no cyanosis or edema  EKG Initial fibrillation with a ventricular response of 111, right bundle-branch block and left axis deviation. I personally reviewed this EKG.  ASSESSMENT AND PLAN:   CAD- CABG 6/10, low risk Myoview 8/13 History of coronary artery disease status post coronary artery bypass grafting June 2010 with a LIMA to his LAD, vein to obtuse marginal branch, diagonal branch and right coronary artery. Myoview performed 10/01/11 showed diaphragmatic Attenuation without ischemia. He denies chest pain or shortness of breath.  Peripheral vascular disease History of carotid artery disease with Dopplers performed 01/09/15 revealing normal right carotid velocities and occluded left internal carotid artery.  Hypertension History of essential hypertension blood pressure measured 125/84. He is on amlodipine, metoprolol, valsartan and hydrochlorothiazide. I am going to Increase his Metoprolol because of an elevated ventricular response to atrial fibrillation.  PAF (paroxysmal atrial fibrillation) History of paroxysmal atrial fibrillation currently in A. Fib with ventricular response of 111. He was in sinus bradycardia at a year ago. He is relatively asymptomatic from this. I am going to increase his metoprolol from 25 mg a day twice a day to 37.5 mg twice a day and will have him see mid-level provider back in one month for follow-up.  Dyslipidemia History of hyperlipidemia on statin therapy. We will obtain a lipid and liver profile.      Runell GessJonathan J. Kiwana Deblasi MD FACP,FACC,FAHA, [facility] 07/30/2016 11:03 AM

## 2016-07-30 NOTE — Patient Instructions (Signed)
Medication Instructions: Increase Metoprolol to 37.5 mg twice daily.   Labwork: Your physician recommends that you return for a FASTING lipid profile and hepatic function panel.   Follow-Up: We request that you follow-up in: 4 weeks with an extender and in 1 year with Dr San MorelleBerry  You will receive a reminder letter in the mail two months in advance. If you don't receive a letter, please call our office to schedule the follow-up appointment.  If you need a refill on your cardiac medications before your next appointment, please call your pharmacy.

## 2016-07-30 NOTE — Assessment & Plan Note (Signed)
History of paroxysmal atrial fibrillation currently in A. Fib with ventricular response of 111. He was in sinus bradycardia at a year ago. He is relatively asymptomatic from this. I am going to increase his metoprolol from 25 mg a day twice a day to 37.5 mg twice a day and will have him see mid-level provider back in one month for follow-up.

## 2016-07-30 NOTE — Assessment & Plan Note (Addendum)
History of coronary artery disease status post coronary artery bypass grafting June 2010 with a LIMA to his LAD, vein to obtuse marginal branch, diagonal branch and right coronary artery. Myoview performed 10/01/11 showed diaphragmatic Attenuation without ischemia. He denies chest pain or shortness of breath.

## 2016-07-30 NOTE — Assessment & Plan Note (Signed)
History of carotid artery disease with Dopplers performed 01/09/15 revealing normal right carotid velocities and occluded left internal carotid artery.

## 2016-08-02 DIAGNOSIS — E785 Hyperlipidemia, unspecified: Secondary | ICD-10-CM | POA: Diagnosis not present

## 2016-08-03 LAB — LIPID PANEL
Chol/HDL Ratio: 2.3 ratio (ref 0.0–5.0)
Cholesterol, Total: 85 mg/dL — ABNORMAL LOW (ref 100–199)
HDL: 37 mg/dL — ABNORMAL LOW (ref >39)
LDL Calculated: 35 mg/dL (ref 0–99)
Triglycerides: 63 mg/dL (ref 0–149)
VLDL Cholesterol Cal: 13 mg/dL (ref 5–40)

## 2016-08-03 LAB — HEPATIC FUNCTION PANEL
ALT: 10 IU/L (ref 0–44)
AST: 12 IU/L (ref 0–40)
Albumin: 3.9 g/dL (ref 3.5–4.7)
Alkaline Phosphatase: 62 IU/L (ref 39–117)
Bilirubin Total: 0.5 mg/dL (ref 0.0–1.2)
Bilirubin, Direct: 0.2 mg/dL (ref 0.00–0.40)
Total Protein: 6.4 g/dL (ref 6.0–8.5)

## 2016-08-19 ENCOUNTER — Telehealth: Payer: Self-pay | Admitting: Cardiovascular Disease

## 2016-08-19 DIAGNOSIS — J069 Acute upper respiratory infection, unspecified: Secondary | ICD-10-CM | POA: Diagnosis not present

## 2016-08-19 DIAGNOSIS — J029 Acute pharyngitis, unspecified: Secondary | ICD-10-CM | POA: Diagnosis not present

## 2016-08-19 DIAGNOSIS — Z6822 Body mass index (BMI) 22.0-22.9, adult: Secondary | ICD-10-CM | POA: Diagnosis not present

## 2016-08-19 DIAGNOSIS — Z1389 Encounter for screening for other disorder: Secondary | ICD-10-CM | POA: Diagnosis not present

## 2016-08-19 NOTE — Telephone Encounter (Signed)
Pt have been taking Metoprolol for the past two weeks. He says his heart rate is still high,this morning it was 115.

## 2016-08-19 NOTE — Telephone Encounter (Signed)
Why don't we increase the morning dose to 50 mg and keep the evening dose of 37.5 mg. Call us back early next week with heart rate measured during the mid day. Keep 7/25 appointment MCr

## 2016-08-19 NOTE — Telephone Encounter (Signed)
Pt of Dr. Allyson SabalBerry Hx PAF  I spoke to patient. He has seen Dr. Allyson SabalBerry 2 weeks ago and had metoprolol dose increased from 25mg  BID to 37.5mg  BID -- (adjustment for A Fib w rate in 110s).  He was asymptomatic at that visit, voiced that he was unsure how long his rate had been elevated prior to that visit.  Pt was instructed to follow up in 1 month w Bjorn LoserRhonda - he has return OV on 7/25.  He voiced his HR is still elevated when checked. Has been 110-116 at home. Was 115 at PCP office today. Unfortunately, he did not have BP readings available - he uses a home wrist cuff to check his pulse, but feels it is unreliable for BP measurements.  He is still asymptomatic - denies SOB, fatigue, weakness, etc.  Of note, his visit to PCP was for acute URI symptoms, he shared that he is fighting off an infection & cough.  Will route to [employer] for advice. Since pt asymptomatic, recommended he continue medication as most recently dosed.  Pt OK to receive follow up call tomorrow. I did advise to call back should he have any new concerns.

## 2016-08-20 MED ORDER — METOPROLOL TARTRATE 50 MG PO TABS: ORAL_TABLET | ORAL | 3 refills | Status: DC

## 2016-08-20 NOTE — Telephone Encounter (Signed)
Spoke with pt, aware of dr croitoru's recommendations. New script sent to the pharmacy  

## 2016-08-25 ENCOUNTER — Other Ambulatory Visit: Payer: Self-pay | Admitting: Pharmacist Clinician (PhC)/ Clinical Pharmacy Specialist

## 2016-08-25 ENCOUNTER — Telehealth: Payer: Self-pay | Admitting: Pharmacist Clinician (PhC)/ Clinical Pharmacy Specialist

## 2016-08-25 MED ORDER — LOSARTAN POTASSIUM-HCTZ 100-25 MG PO TABS: 1.0000 | ORAL_TABLET | Freq: Every day | ORAL | 5 refills | Status: DC

## 2016-08-25 MED ORDER — OLMESARTAN MEDOXOMIL-HCTZ 40-25 MG PO TABS: 1.0000 | ORAL_TABLET | Freq: Every day | ORAL | 5 refills | Status: DC

## 2016-08-25 NOTE — Telephone Encounter (Signed)
olmesartan hct not covered on plan.  Will switch to losartan hct

## 2016-08-25 NOTE — Telephone Encounter (Signed)
Patient switched to olmesartan hct 40/25.  Valsartan recalled

## 2016-09-01 ENCOUNTER — Encounter: Payer: Self-pay | Admitting: Physician Assistant

## 2016-09-01 ENCOUNTER — Ambulatory Visit (INDEPENDENT_AMBULATORY_CARE_PROVIDER_SITE_OTHER): Payer: Medicare Other | Admitting: Physician Assistant

## 2016-09-01 VITALS — BP 122/62 | HR 120 | Ht 71.0 in | Wt 161.6 lb

## 2016-09-01 DIAGNOSIS — I2583 Coronary atherosclerosis due to lipid rich plaque: Secondary | ICD-10-CM | POA: Diagnosis not present

## 2016-09-01 DIAGNOSIS — I480 Paroxysmal atrial fibrillation: Secondary | ICD-10-CM

## 2016-09-01 DIAGNOSIS — R000 Tachycardia, unspecified: Secondary | ICD-10-CM

## 2016-09-01 DIAGNOSIS — I2510 Atherosclerotic heart disease of native coronary artery without angina pectoris: Secondary | ICD-10-CM | POA: Diagnosis not present

## 2016-09-01 DIAGNOSIS — Z79899 Other long term (current) drug therapy: Secondary | ICD-10-CM | POA: Diagnosis not present

## 2016-09-01 DIAGNOSIS — I10 Essential (primary) hypertension: Secondary | ICD-10-CM | POA: Diagnosis not present

## 2016-09-01 LAB — BASIC METABOLIC PANEL
BUN/Creatinine Ratio: 18 (ref 10–24)
BUN: 31 mg/dL — ABNORMAL HIGH (ref 8–27)
CO2: 22 mmol/L (ref 20–29)
Calcium: 9.2 mg/dL (ref 8.6–10.2)
Chloride: 100 mmol/L (ref 96–106)
Creatinine, Ser: 1.77 mg/dL — ABNORMAL HIGH (ref 0.76–1.27)
GFR calc Af Amer: 40 mL/min/{1.73_m2} — ABNORMAL LOW (ref >59)
GFR calc non Af Amer: 34 mL/min/{1.73_m2} — ABNORMAL LOW (ref >59)
Glucose: 288 mg/dL — ABNORMAL HIGH (ref 65–99)
Potassium: 5.3 mmol/L — ABNORMAL HIGH (ref 3.5–5.2)
Sodium: 136 mmol/L (ref 134–144)

## 2016-09-01 LAB — CBC
Hematocrit: 31.7 % — ABNORMAL LOW (ref 37.5–51.0)
Hemoglobin: 10.7 g/dL — ABNORMAL LOW (ref 13.0–17.7)
MCH: 30.8 pg (ref 26.6–33.0)
MCHC: 33.8 g/dL (ref 31.5–35.7)
MCV: 91 fL (ref 79–97)
Platelets: 213 10*3/uL (ref 150–379)
RBC: 3.47 x10E6/uL — ABNORMAL LOW (ref 4.14–5.80)
RDW: 14.1 % (ref 12.3–15.4)
WBC: 10 10*3/uL (ref 3.4–10.8)

## 2016-09-01 LAB — TSH: TSH: 3.03 u[IU]/mL (ref 0.450–4.500)

## 2016-09-01 NOTE — Patient Instructions (Signed)
Medication Instructions:  Your physician recommends that you continue on your current medications as directed. Please refer to the Current Medication list given to you today. If you need a refill on your cardiac medications before your next appointment, please call your pharmacy.  Labwork: BMET, CBC AND TSH TODAY HERE IN OUR OFFICE AT [facility]  Follow-Up: Your physician wants you to follow-up in: 3 MONTHS WITH DR Allyson SabalBERRY.   [facility]

## 2016-09-01 NOTE — Progress Notes (Signed)
Cardiology Office Note   Date:  09/01/2016   ID:  Scott [name], DOB 08-31-1929, MRN [id number]  PCP:  Scott FoundGolding, John, MD  Cardiologist:  Dr Allyson SabalBerry, 07/30/2016  Scott DemarkBarrett, Rhonda, PA-C   Chief Complaint  Patient presents with  . Follow-up    heart rate    History of Present Illness: Scott [name] is a 83 y.o. male with a history of CABG 2010 w/ LIMA-LAD, SVG-OM, SVG-Diag, SVG-RCA, HTN, HLD, DM2, PAF on Xarelto, L-ICA 100%, mild dz R-ICA, L-renal stent, atherectomy R popliteal, MV 2013 ok  06/22 office visit, metoprolol increased, f/u 1 month  Scott [name] presents for cardiology follow up.  He has been compliant with the increased dose of metoprolol, but gets a little dizzy sometimes in the morning (possibly orthostatic). He has not noticed any bleeding issues, is compliant with the Xarelto.  His CBG runs high all the time 170-200 most of the time. He has an appt on 08/02 with Dr Phillips OdorGolding.   He drinks tea at supper, and coffee (1 cup) in the morning, diet ginger ale. Not sweetened, but thinks the coffee and tea have caffeine. Very little water.   Recent URI, better after rx from his PCP. Still with sinus congestion, coughs occasionally, especially in the morning.   He still mows the grass, is active around the house/yard. Never gets CP w/ exertion. His only anginal symptom was fatigue with exertion, has not had that. He walks the dog and does other things without difficulty. His legs do not bother him.   He is rarely aware of his rapid heart rate. He has not felt any irregular beats or skips. When he checks his blood pressure at home, he will be surprised to note that his heart rate is elevated.   Past Medical History:  Diagnosis Date  . Chronic anticoagulation   . Chronic renal insufficiency, stage III (moderate)   . Coronary artery disease 2010   CABG  . Dyslipidemia   . Hypertension 12/26/09   renal doppler  . Hypothyroidism   . PAF (paroxysmal atrial fibrillation)  (HCC)   . Peripheral vascular disease (HCC) 08/01/10   Carotid doppler: 12/26/09 lower arterial doppler  . RBBB   . Shingles April 2012    Past Surgical History:  Procedure Laterality Date  . CARDIAC CATHETERIZATION  6/10  . CORONARY ARTERY BYPASS GRAFT  2010   3-vessel by-pass  . HERNIA REPAIR  3/09  . POPLITEAL ARTERY ANGIOPLASTY Right   . PROSTATE SURGERY  11/99  . RENAL ARTERY ANGIOPLASTY Left 9/11    Current Outpatient Prescriptions  Medication Sig Dispense Refill  . amLODipine (NORVASC) 10 MG tablet TAKE ONE TABLET BY MOUTH ONCE DAILY. 30 tablet 6  . atorvastatin (LIPITOR) 20 MG tablet TAKE ONE TABLET BY MOUTH ONCE DAILY. 30 tablet 7  . cilostazol (PLETAL) 50 MG tablet TAKE (2) TABLETS BY MOUTH TWICE DAILY. 120 tablet 7  . escitalopram (LEXAPRO) 10 MG tablet Take 10 mg by mouth daily.    [name]. levothyroxine (SYNTHROID, LEVOTHROID) 100 MCG tablet Take 100 mcg by mouth daily before breakfast.     . losartan-hydrochlorothiazide (HYZAAR) 100-25 MG tablet Take 1 tablet by mouth daily. 30 tablet 5  . Metoprolol Tartrate 37.5 MG TABS Take 1 tablet by mouth daily with supper.     [name]. XARELTO 15 MG TABS tablet TAKE ONE TABLET BY MOUTH ONCE DAILY WITH SUPPER. 30 tablet 10   No current facility-administered medications for this visit.  Allergies:   Patient has no known allergies.    Social History:  The patient  reports that he quit smoking about 8 years ago. His smoking use included Cigarettes. He smoked 1.00 pack per day. He has never used smokeless tobacco. He reports that he does not drink alcohol.   Family History:  The patient's family history includes CAD in his brother; CVA (age of onset: 5469) in his mother; Diabetes in his father; Stroke in his mother.    ROS:  Please see the history of present illness. All other systems are reviewed and negative.    PHYSICAL EXAM: VS:  BP 122/62 (BP Location: Left Arm, Cuff Size: Normal)   Pulse (!) 120   Ht 5\' 11"  (1.803 m)   Wt 161  lb 9.6 oz (73.3 kg)   SpO2 98%   BMI 22.54 kg/m  , BMI Body mass index is 22.54 kg/m. GEN: Well nourished, well developed, male in no acute distress  HEENT: normal for age  Neck: no JVD, no carotid bruit, no masses Cardiac: RRR; no murmur, no rubs, or gallops Respiratory:  clear to auscultation bilaterally, normal work of breathing GI: soft, nontender, nondistended, + BS MS: no deformity or atrophy; no edema; distal pulses are 2+ in all 4 extremities   Skin: warm and dry, no rash Neuro:  Strength and sensation are intact Psych: euthymic mood, full affect   EKG:  EKG is ordered today. The ekg ordered today demonstrates sinus tachycardia, heart rate 113, frequent PVCs, multifocal   Recent Labs: 08/02/2016: ALT 10    Lipid Panel    Component Value Date/Time   CHOL 85 (L) 08/02/2016 0840   TRIG 63 08/02/2016 0840   HDL 37 (L) 08/02/2016 0840   CHOLHDL 2.3 08/02/2016 0840   CHOLHDL 2.2 08/08/2013 0909   VLDL 10 08/08/2013 0909   LDLCALC 35 08/02/2016 0840     Wt Readings from Last 3 Encounters:  09/01/16 161 lb 9.6 oz (73.3 kg)  07/30/16 164 lb (74.4 kg)  07/30/15 165 lb (74.8 kg)     Other studies Reviewed: Additional studies/ records that were reviewed today include: Office notes, hospital records and testing.  ASSESSMENT AND PLAN:  1.  CAD: He is having no ischemic symptoms. He is not on aspirin because of the Xarelto. He is on metoprolol and Lipitor.  2. Tachycardia, heart rate 100-120 bpm: There are no obvious symptoms associated with tachycardia.  He may be a little dry and is asked to drink more water. He has not had any recent lab work, check a Nutritional therapistBMET.  Because he is on Xarelto we will check a CBC but he denies any bleeding issues or black/tarry stools.  He is on Synthroid and has not had a recent TSH so we will do that as well. If none of the labs give us a reason for his tachycardia, discuss with Dr. Allyson SabalBerry  3. PAF: Although his heart rate is elevated, he is  not in atrial fibrillation. His pulse is irregular, but on the ECG, it is sinus rhythm with frequent PVCs. I do not see flutter waves.  4. HTN: His blood pressure is well controlled today.   Current medicines are reviewed at length with the patient today.  The patient does not have concerns regarding medicines.  The following changes have been made:  no change  Labs/ tests ordered today include:   Orders Placed This Encounter  Procedures  . Basic metabolic panel  . CBC  . TSH  .  EKG 12-Lead     Disposition:   FU with Dr. Allyson Sabal  Signed, Scott Demark, PA-C  09/01/2016 1:09 PM    [facility] Phone: [phone]; Fax: [phone]  This note was written with the assistance of speech recognition software. Please excuse any transcriptional errors.

## 2016-09-06 ENCOUNTER — Other Ambulatory Visit: Payer: Self-pay | Admitting: *Deleted

## 2016-09-06 DIAGNOSIS — Z79899 Other long term (current) drug therapy: Secondary | ICD-10-CM

## 2016-09-07 DIAGNOSIS — Z79899 Other long term (current) drug therapy: Secondary | ICD-10-CM | POA: Diagnosis not present

## 2016-09-07 DIAGNOSIS — R000 Tachycardia, unspecified: Secondary | ICD-10-CM | POA: Diagnosis not present

## 2016-09-08 LAB — BASIC METABOLIC PANEL
BUN/Creatinine Ratio: 20 (ref 10–24)
BUN: 32 mg/dL — ABNORMAL HIGH (ref 8–27)
CO2: 22 mmol/L (ref 20–29)
Calcium: 9.3 mg/dL (ref 8.6–10.2)
Chloride: 103 mmol/L (ref 96–106)
Creatinine, Ser: 1.63 mg/dL — ABNORMAL HIGH (ref 0.76–1.27)
GFR calc Af Amer: 44 mL/min/{1.73_m2} — ABNORMAL LOW (ref >59)
GFR calc non Af Amer: 38 mL/min/{1.73_m2} — ABNORMAL LOW (ref >59)
Glucose: 173 mg/dL — ABNORMAL HIGH (ref 65–99)
Potassium: 5.2 mmol/L (ref 3.5–5.2)
Sodium: 138 mmol/L (ref 134–144)

## 2016-09-08 LAB — CBC
Hematocrit: 33.3 % — ABNORMAL LOW (ref 37.5–51.0)
Hemoglobin: 11.3 g/dL — ABNORMAL LOW (ref 13.0–17.7)
MCH: 31.1 pg (ref 26.6–33.0)
MCHC: 33.9 g/dL (ref 31.5–35.7)
MCV: 92 fL (ref 79–97)
Platelets: 168 10*3/uL (ref 150–379)
RBC: 3.63 x10E6/uL — ABNORMAL LOW (ref 4.14–5.80)
RDW: 14.6 % (ref 12.3–15.4)
WBC: 5.9 10*3/uL (ref 3.4–10.8)

## 2016-09-08 LAB — TSH: TSH: 3.9 u[IU]/mL (ref 0.450–4.500)

## 2016-09-13 ENCOUNTER — Other Ambulatory Visit: Payer: Self-pay | Admitting: Cardiovascular Disease

## 2016-10-13 ENCOUNTER — Other Ambulatory Visit: Payer: Self-pay | Admitting: Cardiovascular Disease

## 2016-10-13 NOTE — Telephone Encounter (Signed)
REFILL 

## 2016-11-22 ENCOUNTER — Other Ambulatory Visit: Payer: Self-pay | Admitting: Cardiovascular Disease

## 2016-11-22 NOTE — Telephone Encounter (Signed)
REFILL 

## 2016-12-01 ENCOUNTER — Ambulatory Visit (INDEPENDENT_AMBULATORY_CARE_PROVIDER_SITE_OTHER): Payer: Medicare Other | Admitting: Cardiovascular Disease

## 2016-12-01 ENCOUNTER — Encounter: Payer: Self-pay | Admitting: Cardiovascular Disease

## 2016-12-01 VITALS — BP 104/62 | HR 108 | Ht 71.0 in | Wt 167.4 lb

## 2016-12-01 DIAGNOSIS — I10 Essential (primary) hypertension: Secondary | ICD-10-CM | POA: Diagnosis not present

## 2016-12-01 DIAGNOSIS — I480 Paroxysmal atrial fibrillation: Secondary | ICD-10-CM

## 2016-12-01 DIAGNOSIS — E785 Hyperlipidemia, unspecified: Secondary | ICD-10-CM

## 2016-12-01 DIAGNOSIS — I739 Peripheral vascular disease, unspecified: Secondary | ICD-10-CM | POA: Diagnosis not present

## 2016-12-01 DIAGNOSIS — I6522 Occlusion and stenosis of left carotid artery: Secondary | ICD-10-CM | POA: Diagnosis not present

## 2016-12-01 DIAGNOSIS — I2510 Atherosclerotic heart disease of native coronary artery without angina pectoris: Secondary | ICD-10-CM

## 2016-12-01 DIAGNOSIS — I779 Disorder of arteries and arterioles, unspecified: Secondary | ICD-10-CM | POA: Insufficient documentation

## 2016-12-01 MED ORDER — METOPROLOL TARTRATE 50 MG PO TABS: 50.0000 mg | ORAL_TABLET | Freq: Two times a day (BID) | ORAL | 3 refills | Status: DC

## 2016-12-01 NOTE — Assessment & Plan Note (Signed)
History of peripheral arterial disease status post left renal artery stenting as well as atherectomy of the right popliteal artery. Follow-up Dopplers and angiography of his popliteal artery showed this to have been occluded.

## 2016-12-01 NOTE — Assessment & Plan Note (Addendum)
History of paroxysmal atrial fibrillation with EKG today showing him to be in a flutter with 21 conduction and a ventricular response of 104 on Xarelto  oral anticoagulation. His baseline heart rate is mildly elevated at 110. I am going to up titrate his blocker for metoprolol 37.5 mg by mouth twice a day to 50 mg by mouth twice a day. At this point, since he is completely asymptomatic and given his age of 83 I am content for rate control and not conversion to sinus rhythm.

## 2016-12-01 NOTE — Patient Instructions (Addendum)
Medication Instructions: STOP TOPROL-XL (Metoprolol Succinate).  Increase Metoprolol Tartrate (Lopressor) to 50 mg twice daily.   Follow-Up: We request that you follow-up in: 1 month with Theodore Demarkhonda Barrett, PA and in 12 months with Dr San MorelleBerry  You will receive a reminder letter in the mail two months in advance. If you don't receive a letter, please call our office to schedule the follow-up appointment.  If you need a refill on your cardiac medications before your next appointment, please call your pharmacy.

## 2016-12-01 NOTE — Progress Notes (Signed)
12/01/2016 Scott [name]   1929-09-30  [id number]  Primary Physician Assunta FoundGolding, John, MD Primary Cardiologist: Runell GessJonathan J Tycen Dockter MD FACP, [place]FACC, [place], [place]  HPI:  Scott LoboBobby L Holroyd is a 83 y.o. male thin-appearing married Caucasian male, father of 2 and grandfather to 4 grandchildren, whom I last saw in the office 07/30/16.[name]. He has a history of CAD, status post coronary artery bypass grafting June 2010 with a LIMA to his LAD, a vein to an obtuse marginal branch, a diagonal branch, and right coronary artery.   His other problems include hypertension, hyperlipidemia, and newly diagnosed non-insulin-requiring diabetes as well as paroxysmal AFib without recurrence. He has known peripheral vascular occlusive disease and an occluded left carotid, mild right ICA stenosis neurologically asymptomatic. I stented his left renal artery and performed atherectomy on his right popliteal artery. However, followup Dopplers and angiography have shown his popliteal artery to be occluded. His carotid Dopplers and renal Dopplers have remained stable. When I saw him a month ago he did complain of some mild increasing shortness of breath but denied chest pain. He had a Myoview performed October 01, 2011, that showed diaphragmatic attenuation without ischemia. Based on his AFib and high CHADS2 score, I elected to systemically anticoagulate him with renally dosed Xarelto and increased his metoprolol from 12.5 to 25 mg p.o. b.i.d. He has had no recurrent episodes of PAF. A 2D echo performed on May 31, 2012, revealed normal LV systolic function, grade 1 diastolic dysfunction without significant valvular abnormalities.since I saw him in the office is done well. He denies chest pain or shortness of breath. His heart rate still is in the low 100 range and his EKG today shows atrial flutter with 2:1 conduction. At this point, I'm content with rate control as opposed to rhythm control.  Current Meds  Medication Sig  . amLODipine  (NORVASC) 10 MG tablet TAKE ONE TABLET BY MOUTH ONCE DAILY.  [name]. atorvastatin (LIPITOR) 20 MG tablet TAKE (1) TABLET BY MOUTH ONCE DAILY.  . cilostazol (PLETAL) 50 MG tablet TAKE (2) TABLETS BY MOUTH TWICE DAILY.  [name]. escitalopram (LEXAPRO) 10 MG tablet Take 10 mg by mouth daily.  [name]. levothyroxine (SYNTHROID, LEVOTHROID) 100 MCG tablet Take 100 mcg by mouth daily before breakfast.   . losartan-hydrochlorothiazide (HYZAAR) 100-25 MG tablet Take 1 tablet by mouth daily.  [name]. XARELTO 15 MG TABS tablet TAKE ONE TABLET BY MOUTH ONCE DAILY WITH SUPPER.  . [DISCONTINUED] metoprolol succinate (TOPROL-XL) 50 MG 24 hr tablet Take 50 mg by mouth daily. Take with or immediately following a meal.  . [DISCONTINUED] Metoprolol Tartrate 37.5 MG TABS Take 1 tablet by mouth daily with supper.      No Known Allergies  Social History   Social History  . Marital status: Married    Spouse name: N/A  . Number of children: 2  . Years of education: N/A   Occupational History  . retired    Social History Main Topics  . Smoking status: Former Smoker    Packs/day: 1.00    Types: Cigarettes    Quit date: 07/28/2008  . Smokeless tobacco: Never Used  . Alcohol use No  . Drug use: Unknown  . Sexual activity: Not on file   Other Topics Concern  . Not on file   Social History Narrative  . No narrative on file     Review of Systems: General: negative for chills, fever, night sweats or weight changes.  Cardiovascular: negative for chest pain, dyspnea  on exertion, edema, orthopnea, palpitations, paroxysmal nocturnal dyspnea or shortness of breath Dermatological: negative for rash Respiratory: negative for cough or wheezing Urologic: negative for hematuria Abdominal: negative for nausea, vomiting, diarrhea, bright red blood per rectum, melena, or hematemesis Neurologic: negative for visual changes, syncope, or dizziness All other systems reviewed and are otherwise negative except as noted above.    Blood  pressure 104/62, pulse (!) 108, height 5\' 11"  (1.803 m), weight 167 lb 6.4 oz (75.9 kg).  General appearance: alert and no distress Neck: no adenopathy, no carotid bruit, no JVD, supple, symmetrical, trachea midline and thyroid not enlarged, symmetric, no tenderness/mass/nodules Lungs: clear to auscultation bilaterally Heart: irregularly irregular rhythm Extremities: extremities normal, atraumatic, no cyanosis or edema Pulses: 2+ and symmetric Skin: Skin color, texture, turgor normal. No rashes or lesions Neurologic: Alert and oriented X 3, normal strength and tone. Normal symmetric reflexes. Normal coordination and gait  EKG atrial flutter with rapid ventricular response of 104, 2: 1 conduction with right bundle branch block and left axis deviation. I personally reviewed this EKG.  ASSESSMENT AND PLAN:   CAD- CABG 6/10, low risk Myoview 8/13 History of CAD status post coronary artery bypass grafting June 2010 the LIMA to his LAD, vein to an obtuse marginal branch, diagonal branch and right coronary artery. His last Myoview performed 10/01/11 showed diaphragmatic attenuation without ischemia and his last 2-D echo performed 05/31/12 revealed normal LV function with grade 1 diastolic dysfunction. He denies chest pain or shortness of breath.  Peripheral vascular disease History of peripheral arterial disease status post left renal artery stenting as well as atherectomy of the right popliteal artery. Follow-up Dopplers and angiography of his popliteal artery showed this to have been occluded.  Hypertension History of essential hypertension blood pressure measurements at 104/62. He is on amlodipine, losartan, hydrochlorothiazide and metoprolol. Continue current meds at current dosing.  PAF (paroxysmal atrial fibrillation) History of paroxysmal atrial fibrillation with EKG today showing him to be in a flutter with 21 conduction and a ventricular response of 104 on Xarelto  oral anticoagulation. His  baseline heart rate is mildly elevated at 110. I am going to up titrate his blocker for metoprolol 37.5 mg by mouth twice a day to 50 mg by mouth twice a day. At this point, since he is completely asymptomatic and given his age of 70 I am content for rate control and not conversion to sinus rhythm.  Dyslipidemia History of dyslipidemia on statin therapy with recent lipid profile performed 08/02/16 revealing an LDL of 85 and HDL 37.  Carotid artery disease (HCC) History of carotid artery disease with Doppler study performed 01/09/15 revealing an occluded left internal carotid artery which is chronic and only mild right ICA stenosis. This has been shown to be stable over time.      Runell Gess MD FACP,FACC,FAHA, [facility] 12/01/2016 9:31 AM

## 2016-12-01 NOTE — Assessment & Plan Note (Signed)
History of CAD status post coronary artery bypass grafting June 2010 the LIMA to his LAD, vein to an obtuse marginal branch, diagonal branch and right coronary artery. His last Myoview performed 10/01/11 showed diaphragmatic attenuation without ischemia and his last 2-D echo performed 05/31/12 revealed normal LV function with grade 1 diastolic dysfunction. He denies chest pain or shortness of breath.

## 2016-12-01 NOTE — Assessment & Plan Note (Signed)
History of carotid artery disease with Doppler study performed 01/09/15 revealing an occluded left internal carotid artery which is chronic and only mild right ICA stenosis. This has been shown to be stable over time.

## 2016-12-01 NOTE — Assessment & Plan Note (Signed)
History of dyslipidemia on statin therapy with recent lipid profile performed 08/02/16 revealing an LDL of 85 and HDL 37.

## 2016-12-01 NOTE — Assessment & Plan Note (Signed)
History of essential hypertension blood pressure measurements at 104/62. He is on amlodipine, losartan, hydrochlorothiazide and metoprolol. Continue current meds at current dosing.

## 2017-01-03 ENCOUNTER — Encounter: Payer: Self-pay | Admitting: Physician Assistant

## 2017-01-03 ENCOUNTER — Ambulatory Visit (INDEPENDENT_AMBULATORY_CARE_PROVIDER_SITE_OTHER): Payer: Medicare Other | Admitting: Physician Assistant

## 2017-01-03 VITALS — BP 132/56 | HR 109 | Ht 71.0 in | Wt 163.0 lb

## 2017-01-03 DIAGNOSIS — I6522 Occlusion and stenosis of left carotid artery: Secondary | ICD-10-CM

## 2017-01-03 DIAGNOSIS — I484 Atypical atrial flutter: Secondary | ICD-10-CM

## 2017-01-03 DIAGNOSIS — I10 Essential (primary) hypertension: Secondary | ICD-10-CM | POA: Diagnosis not present

## 2017-01-03 DIAGNOSIS — I2510 Atherosclerotic heart disease of native coronary artery without angina pectoris: Secondary | ICD-10-CM

## 2017-01-03 MED ORDER — METOPROLOL TARTRATE 75 MG PO TABS: 75.0000 mg | ORAL_TABLET | Freq: Two times a day (BID) | ORAL | 3 refills | Status: DC

## 2017-01-03 MED ORDER — LOSARTAN POTASSIUM-HCTZ 100-25 MG PO TABS: 0.5000 | ORAL_TABLET | Freq: Every day | ORAL | 5 refills | Status: DC

## 2017-01-03 NOTE — Patient Instructions (Signed)
Medication Instructions:  INCREASE METOPROLOL 75MG (1.5 TAB) TWICE DAILY DECREASE LOSARTAN-HCTZ 1/2 TABLET DAILY If you need a refill on your cardiac medications before your next appointment, please call your pharmacy.  Special Instructions: CALL WITH ANY PROBLEMS WITH MEDICATION CHANGES  Follow-Up: Your physician wants you to follow-up in: June 2019 WITH DR BERRY.  Thank you for choosing [facility]!!

## 2017-01-03 NOTE — Progress Notes (Signed)
Cardiology Office Note   Date:  01/03/2017   ID:  Scott [name], DOB [birth year], MRN [id number]  PCP:  Assunta FoundGolding, John, MD  Cardiologist: Dr. Allyson SabalBerry, 12/01/2016 Theodore Demarkhonda Mylan Lengyel, PA-C   Chief Complaint  Patient presents with  . Follow-up    History of Present Illness: Scott [name] is a 83 y.o. male with a history of CABG 07/2008 w/ LIMA-LAD, SVG-OM, SVG-Diag, SVG-RCA, HTN, HLD, IDDM, PAF, L-ICA 100%, mild R-ICA dz, L-RAS s/p stent, atherectomy R-popliteal (now 100%), MV 2013 w/ diaphragmatic attn but no ischemia, CHADS2VASC=5 (CAD, HTN, DM, age x 2) on Xarelto, EF nl on echo 2014 with grade 1 diastolic dysfunction  10/24 office visit, patient was atrial flutter with 2-1 conduction, rate control recommended, beta-blocker increased  Scott [name] presents for cardiology followup.  He denies palpitations, but says that occasionally he can feel his heart go fast. He does not feel light-headed or dizzy.   He has to walk slowly to keep his legs from hurting, but can walk the dogs for 15-20 min at a time without SOB. He will get leg pain if he walks too fast and his legs will get weak. He does not feel one leg is worse than the other. 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  He takes care of things around the house.  He does not get chest pain with activity. He has to pace himself to keep his legs from getting weak.  As long as he paces himself, he does not feel limited by his legs, or by his heart.  He has some congestion, but says it generally comes up. He feels this is sinus drainage. No weight gain, no LE edema, no change in DOE.  Is clear that he has been compliant with his Xarelto and has missed no doses .   Past Medical History:  Diagnosis Date  . Chronic anticoagulation   . Chronic renal insufficiency, stage III (moderate) (HCC)   . Coronary artery disease 2010   CABG  . Dyslipidemia   . Hypertension 12/26/09   renal doppler  . Hypothyroidism   . PAF (paroxysmal atrial  fibrillation) (HCC)   . Peripheral vascular disease (HCC) 08/01/10   Carotid doppler: 12/26/09 lower arterial doppler  . RBBB   . Shingles April 2012    Past Surgical History:  Procedure Laterality Date  . CARDIAC CATHETERIZATION  6/10  . CORONARY ARTERY BYPASS GRAFT  2010   3-vessel by-pass  . HERNIA REPAIR  3/09  . POPLITEAL ARTERY ANGIOPLASTY Right   . PROSTATE SURGERY  11/99  . RENAL ARTERY ANGIOPLASTY Left 9/11    Current Outpatient Medications  Medication Sig Dispense Refill  . amLODipine (NORVASC) 10 MG tablet TAKE ONE TABLET BY MOUTH ONCE DAILY. 30 tablet 6  . atorvastatin (LIPITOR) 20 MG tablet TAKE (1) TABLET BY MOUTH ONCE DAILY. 30 tablet 5  . cilostazol (PLETAL) 50 MG tablet TAKE (2) TABLETS BY MOUTH TWICE DAILY. 120 tablet 4  . escitalopram (LEXAPRO) 10 MG tablet Take 10 mg by mouth daily.    [name]. levothyroxine (SYNTHROID, LEVOTHROID) 100 MCG tablet Take 100 mcg by mouth daily before breakfast.     . losartan-hydrochlorothiazide (HYZAAR) 100-25 MG tablet Take 1 tablet by mouth daily. 30 tablet 5  . metoprolol tartrate (LOPRESSOR) 50 MG tablet Take 1 tablet (50 mg total) by mouth 2 (two) times daily. 60 tablet 3  . XARELTO 15 MG TABS tablet TAKE ONE TABLET BY MOUTH ONCE DAILY WITH SUPPER.  30 tablet 5   No current facility-administered medications for this visit.     Allergies:   Patient has no known allergies.    Social History:  The patient  reports that he quit smoking about 8 years ago. His smoking use included cigarettes. He smoked 1.00 pack per day. he has never used smokeless tobacco. He reports that he does not drink alcohol.   Family History:  The patient's family history includes CAD in his brother; CVA (age of onset: 51) in his mother; Diabetes in his father; Stroke in his mother.    ROS:  Please see the history of present illness. All other systems are reviewed and negative.    PHYSICAL EXAM: VS:  BP (!) 132/56   Pulse (!) 109   Ht 5\' 11"  (1.803 m)    Wt 163 lb (73.9 kg)   BMI 22.73 kg/m  , BMI Body mass index is 22.73 kg/m. GEN: Well nourished, well developed, male in no acute distress  HEENT: normal for age  Neck: no JVD, no carotid bruit, no masses Cardiac: slightly irregular R&R; no murmur, no rubs, or gallops Respiratory:  clear to auscultation bilaterally except for a few rhonchi that clear with cough, normal work of breathing GI: soft, nontender, nondistended, + BS MS: no deformity or atrophy; no edema; distal pulses are 2+ upper extremities, decreased but palpable in both lower extremities Skin: warm and dry, no rash Neuro:  Strength and sensation are intact Psych: euthymic mood, full affect   EKG:  EKG is ordered today.  12-lead rhythm strip done also The ekg ordered today demonstrates atrial flutter, right bundle branch block is old, heart rate 109 12-lead rhythm strip shows atrial flutter, flutter waves clearly visible when the patient does Valsalva maneuvers   Recent Labs: 08/02/2016: ALT 10 09/07/2016: BUN 32; Creatinine, Ser 1.63; Hemoglobin 11.3; Platelets 168; Potassium 5.2; Sodium 138; TSH 3.900    Lipid Panel    Component Value Date/Time   CHOL 85 (L) 08/02/2016 0840   TRIG 63 08/02/2016 0840   HDL 37 (L) 08/02/2016 0840   CHOLHDL 2.3 08/02/2016 0840   CHOLHDL 2.2 08/08/2013 0909   VLDL 10 08/08/2013 0909   LDLCALC 35 08/02/2016 0840     Wt Readings from Last 3 Encounters:  01/03/17 163 lb (73.9 kg)  12/01/16 167 lb 6.4 oz (75.9 kg)  09/01/16 161 lb 9.6 oz (73.3 kg)     Other studies Reviewed: Additional studies/ records that were reviewed today include: Office notes, hospital records and testing.  ASSESSMENT AND PLAN:  1.  Atrial flutter: When I reviewed the ECG from July 2018, it looks like atrial flutter then as well.  This was confirmed by getting a 12-lead rhythm strip which showed flutter waves clearly when he did the Valsalva maneuver.   We discussed strategies that included rhythm control  and rate control.  He is not clearly symptomatic from the atrial flutter and wishes to pursue rate control at this time.  His rate increases with minimal activity and conversation so I will increase his beta-blocker.  I do not wish him to get lightheaded so we will decrease his losartan/HCTZ.  He is to let us know how he tolerates this.   He is aware that any time he wishes to pursue cardioversion, we can schedule it as long as he has not missed any doses of Xarelto.  2.  Hypertension: His blood pressure is well controlled today.  I am not sure that he will  tolerate an increase in the beta-blocker without a decrease in another blood pressure med.  His volume status is good so I will decrease the losartan/HCTZ.  His EF is normal and he has never had problems with volume overload.  3.  PAD: Does not have any claudication symptoms as long as he does not push himself too hard.  He is encouraged to continue his activity level and increase it as tolerated.  Continue beta-blocker and statin.  No aspirin because he is on Xarelto.  4.  CAD: He is not having any ischemic symptoms.  His claudication seems to be the limiting factor for exertion.  He is not having any anginal symptoms.   Current medicines are reviewed at length with the patient today.  The patient has concerns regarding medicines.  He was given samples of Xarelto.  The following changes have been made: Change metoprolol to 75 mg twice daily and decrease Hyzaar  Labs/ tests ordered today include:   Orders Placed This Encounter  Procedures  . EKG 12-Lead     Disposition:   FU with Dr. Allyson SabalBerry  Signed, Theodore Demarkhonda Evolette Pendell, PA-C  01/03/2017 9:48 AM    [facility] Phone: [phone]; Fax: [phone]  This note was written with the assistance of speech recognition software. Please excuse any transcriptional errors.

## 2017-01-11 DIAGNOSIS — Z0000 Encounter for general adult medical examination without abnormal findings: Secondary | ICD-10-CM | POA: Diagnosis not present

## 2017-01-11 DIAGNOSIS — Z23 Encounter for immunization: Secondary | ICD-10-CM | POA: Diagnosis not present

## 2017-01-11 DIAGNOSIS — E063 Autoimmune thyroiditis: Secondary | ICD-10-CM | POA: Diagnosis not present

## 2017-01-11 DIAGNOSIS — E119 Type 2 diabetes mellitus without complications: Secondary | ICD-10-CM | POA: Diagnosis not present

## 2017-01-11 DIAGNOSIS — Z0001 Encounter for general adult medical examination with abnormal findings: Secondary | ICD-10-CM | POA: Diagnosis not present

## 2017-01-11 DIAGNOSIS — Z6823 Body mass index (BMI) 23.0-23.9, adult: Secondary | ICD-10-CM | POA: Diagnosis not present

## 2017-01-11 DIAGNOSIS — Z1389 Encounter for screening for other disorder: Secondary | ICD-10-CM | POA: Diagnosis not present

## 2017-01-11 DIAGNOSIS — F329 Major depressive disorder, single episode, unspecified: Secondary | ICD-10-CM | POA: Diagnosis not present

## 2017-02-10 ENCOUNTER — Encounter: Payer: Self-pay | Admitting: Cardiovascular Disease

## 2017-02-22 ENCOUNTER — Other Ambulatory Visit: Payer: Self-pay | Admitting: Cardiovascular Disease

## 2017-03-09 ENCOUNTER — Other Ambulatory Visit: Payer: Self-pay | Admitting: Cardiovascular Disease

## 2017-03-18 ENCOUNTER — Ambulatory Visit: Payer: Medicare Other | Admitting: Cardiovascular Disease

## 2017-04-11 ENCOUNTER — Other Ambulatory Visit: Payer: Self-pay | Admitting: Cardiovascular Disease

## 2017-04-26 ENCOUNTER — Other Ambulatory Visit: Payer: Self-pay | Admitting: Cardiovascular Disease

## 2017-04-26 NOTE — Telephone Encounter (Signed)
Rx has been sent to the pharmacy electronically. ° °

## 2017-05-10 ENCOUNTER — Encounter: Payer: Self-pay | Admitting: Cardiovascular Disease

## 2017-05-10 ENCOUNTER — Ambulatory Visit (INDEPENDENT_AMBULATORY_CARE_PROVIDER_SITE_OTHER): Payer: Medicare Other | Admitting: Cardiovascular Disease

## 2017-05-10 VITALS — BP 112/70 | HR 104 | Ht 71.0 in | Wt 166.0 lb

## 2017-05-10 DIAGNOSIS — I25708 Atherosclerosis of coronary artery bypass graft(s), unspecified, with other forms of angina pectoris: Secondary | ICD-10-CM | POA: Diagnosis not present

## 2017-05-10 DIAGNOSIS — E785 Hyperlipidemia, unspecified: Secondary | ICD-10-CM | POA: Diagnosis not present

## 2017-05-10 DIAGNOSIS — I10 Essential (primary) hypertension: Secondary | ICD-10-CM | POA: Diagnosis not present

## 2017-05-10 DIAGNOSIS — I4892 Unspecified atrial flutter: Secondary | ICD-10-CM

## 2017-05-10 DIAGNOSIS — I739 Peripheral vascular disease, unspecified: Secondary | ICD-10-CM | POA: Diagnosis not present

## 2017-05-10 MED ORDER — METOPROLOL TARTRATE 100 MG PO TABS: 100.0000 mg | ORAL_TABLET | Freq: Two times a day (BID) | ORAL | 3 refills | Status: DC

## 2017-05-10 MED ORDER — AMLODIPINE BESYLATE 5 MG PO TABS: 5.0000 mg | ORAL_TABLET | Freq: Every day | ORAL | 3 refills | Status: DC

## 2017-05-10 NOTE — Patient Instructions (Signed)
Your physician wants you to follow-up in:  6 months with Dr.Koneswaran You will receive a reminder letter in the mail two months in advance. If you don't receive a letter, please call our office to schedule the follow-up appointment.   INCREASE Metoprolol to 100 mg twice a day    DECREASE Norvasc to 5 mg daily    No lab work or test ordered today       Thank you for choosing [facility] !

## 2017-05-10 NOTE — Progress Notes (Signed)
SUBJECTIVE: The patient presents to establish care with me.  This is my first time meeting him.  He has a history of atrial flutter and was last evaluated in the cardiology office on 01/03/17.  A rate control strategy with anticoagulation was adopted.  He also has a history of CABG in June 2010 with a LIMA to the LAD, SVG to the OM, SVG to the diagonal, and SVG to the RCA.  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  He denies chest pain, palpitations, exertional dyspnea, and claudication.  He denies leg swelling, lightheadedness, dizziness, and bleeding problems with Xarelto.    Review of Systems: As per "subjective", otherwise negative.  No Known Allergies  Current Outpatient Medications  Medication Sig Dispense Refill  . amLODipine (NORVASC) 10 MG tablet TAKE (1) TABLET BY MOUTH ONCE DAILY. 30 tablet 6  . atorvastatin (LIPITOR) 20 MG tablet TAKE (1) TABLET BY MOUTH ONCE DAILY. 30 tablet 5  . cilostazol (PLETAL) 50 MG tablet TAKE (2) TABLETS BY MOUTH TWICE DAILY. 120 tablet 0  . escitalopram (LEXAPRO) 10 MG tablet Take 10 mg by mouth daily.    [name]. glimepiride (AMARYL) 1 MG tablet Take 1 mg by mouth at bedtime.     [name]. levothyroxine (SYNTHROID, LEVOTHROID) 100 MCG tablet Take 100 mcg by mouth daily before breakfast.     . losartan-hydrochlorothiazide (HYZAAR) 100-25 MG tablet Take 0.5 tablets by mouth daily. 30 tablet 5  . metoprolol tartrate 75 MG TABS Take 75 mg by mouth 2 (two) times daily. 60 tablet 3  . XARELTO 15 MG TABS tablet TAKE ONE TABLET BY MOUTH ONCE DAILY WITH SUPPER. 30 tablet 0   No current facility-administered medications for this visit.       Past Medical History:  Diagnosis Date  . Chronic anticoagulation   . Chronic renal insufficiency, stage III (moderate) (HCC)   . Coronary artery disease 2010   CABG  . Dyslipidemia   . Hypertension 12/26/09   renal doppler  . Hypothyroidism   . PAF (paroxysmal atrial fibrillation) (HCC)   . Peripheral vascular disease (HCC) 08/01/10   Carotid doppler: 12/26/09 lower arterial doppler  . RBBB   . Shingles April 2012    Past Surgical History:  Procedure Laterality Date  . CARDIAC CATHETERIZATION  6/10  . CORONARY ARTERY BYPASS GRAFT  2010   3-vessel by-pass  . HERNIA REPAIR  3/09  . POPLITEAL ARTERY ANGIOPLASTY Right   . PROSTATE SURGERY  11/99  . RENAL ARTERY ANGIOPLASTY Left 9/11    Social History   Socioeconomic History  . Marital status: Married    Spouse name: Not on file  . Number of children: 2  . Years of education: Not on file  . Highest education level: Not on file  Occupational History  . Occupation: retired  Engineer, productionocial Needs  . Financial resource strain: Not on file  . Food insecurity:    Worry: Not on file    Inability: Not on file  . Transportation needs:    Medical: Not on file    Non-medical: Not on file  Tobacco Use  . Smoking status: Former Smoker    Packs/day: 1.00    Types: Cigarettes  Last attempt to quit: 07/28/2008    Years since quitting: 8.7  . Smokeless tobacco: Never Used  Substance and Sexual Activity  . Alcohol use: No    Alcohol/week: 0.0 oz  . Drug use: Not on file  . Sexual activity: Not on file  Lifestyle  . Physical activity:    Days per week: Not on file    Minutes per session: Not on file  . Stress: Not on file  Relationships  . Social connections:    Talks on phone: Not on file    Gets together: Not on file    Attends religious service: Not on file    Active member of club or organization: Not on file    Attends meetings of clubs or organizations: Not on file    Relationship status: Not on file  . Intimate  partner violence:    Fear of current or ex partner: Not on file    Emotionally abused: Not on file    Physically abused: Not on file    Forced sexual activity: Not on file  Other Topics Concern  . Not on file  Social History Narrative  . Not on file     Vitals:   05/10/17 1509 05/10/17 1513  BP: 118/60 112/70  Pulse: (!) 104   SpO2: 94%   Weight: 166 lb (75.3 kg)   Height: 5\' 11"  (1.803 m)     Wt Readings from Last 3 Encounters:  05/10/17 166 lb (75.3 kg)  01/03/17 163 lb (73.9 kg)  12/01/16 167 lb 6.4 oz (75.9 kg)     PHYSICAL EXAM General: NAD HEENT: Normal. Neck: No JVD, no thyromegaly. Lungs: Clear to auscultation bilaterally with normal respiratory effort. CV: Mildly tachycardic, regular rhythm, normal S1/S2, no S3/S4, no murmur. No pretibial or periankle edema.  No carotid bruit.   Abdomen: Soft, nontender, no distention.  Neurologic: Alert and oriented.  Psych: Normal affect. Skin: Normal. Musculoskeletal: No gross deformities.    ECG: Most recent ECG reviewed.   Labs: Lab Results  Component Value Date/Time   K 5.2 09/07/2016 09:18 AM   BUN 32 (H) 09/07/2016 09:18 AM   CREATININE 1.63 (H) 09/07/2016 09:18 AM   CREATININE 1.63 (H) 02/14/2014 11:16 AM   ALT 10 08/02/2016 08:40 AM   TSH 3.900 09/07/2016 09:18 AM   HGB 11.3 (L) 09/07/2016 09:18 AM     Lipids: Lab Results  Component Value Date/Time   LDLCALC 35 08/02/2016 08:40 AM   CHOL 85 (L) 08/02/2016 08:40 AM   TRIG 63 08/02/2016 08:40 AM   HDL 37 (L) 08/02/2016 08:40 AM       ASSESSMENT AND PLAN: 1.  Rapid atrial flutter: Systemically anticoagulated with low-dose Xarelto, 50 mg.  Asymptomatic.  I will increase metoprolol tartrate to 100 mg twice daily.  2.  Coronary artery disease status post CABG: Currently on Lipitor and metoprolol.  Not on aspirin presumably because he is on Xarelto.  Low risk nuclear stress test in 2013.  Symptomatically stable.  3.  Peripheral arterial disease: He  takes Lipitor.  He is not on aspirin as he is on Xarelto.  Symptomatically stable.  4.  Hypertension: Blood pressure is normal.  As I am increasing metoprolol to adequately control heart rate, I will reduce amlodipine to 5 mg daily.  5.  Hyperlipidemia: He is on Lipitor.  I will obtain a copy of lipids from PCP.    Disposition: Follow up 6 months  Time spent: 40 minutes, of which  greater than 50% was spent reviewing symptoms, relevant blood tests and studies, and discussing management plan with the patient.    Prentice Docker, M.D., F.A.C.C.

## 2017-05-11 ENCOUNTER — Other Ambulatory Visit: Payer: Self-pay | Admitting: Cardiovascular Disease

## 2017-05-11 NOTE — Telephone Encounter (Signed)
REFILL 

## 2017-06-09 ENCOUNTER — Other Ambulatory Visit: Payer: Self-pay | Admitting: Cardiovascular Disease

## 2017-06-09 NOTE — Telephone Encounter (Signed)
REFILL 

## 2017-12-13 ENCOUNTER — Encounter: Payer: Self-pay | Admitting: Cardiovascular Disease

## 2017-12-13 ENCOUNTER — Ambulatory Visit (INDEPENDENT_AMBULATORY_CARE_PROVIDER_SITE_OTHER): Payer: Medicare Other | Admitting: Cardiovascular Disease

## 2017-12-13 VITALS — BP 106/66 | HR 94 | Ht 71.0 in | Wt 165.0 lb

## 2017-12-13 DIAGNOSIS — I10 Essential (primary) hypertension: Secondary | ICD-10-CM

## 2017-12-13 DIAGNOSIS — I739 Peripheral vascular disease, unspecified: Secondary | ICD-10-CM

## 2017-12-13 DIAGNOSIS — I25708 Atherosclerosis of coronary artery bypass graft(s), unspecified, with other forms of angina pectoris: Secondary | ICD-10-CM | POA: Diagnosis not present

## 2017-12-13 DIAGNOSIS — E785 Hyperlipidemia, unspecified: Secondary | ICD-10-CM | POA: Diagnosis not present

## 2017-12-13 DIAGNOSIS — I4892 Unspecified atrial flutter: Secondary | ICD-10-CM | POA: Diagnosis not present

## 2017-12-13 NOTE — Patient Instructions (Signed)
Medication Instructions:  Your physician recommends that you continue on your current medications as directed. Please refer to the Current Medication list given to you today.  If you need a refill on your cardiac medications before your next appointment, please call your pharmacy.   Lab work:None  If you have labs (blood work) drawn today and your tests are completely normal, you will receive your results only by: . MyChart Message (if you have MyChart) OR . A paper copy in the mail If you have any lab test that is abnormal or we need to change your treatment, we will call you to review the results.  Testing/Procedures:  NONE Follow-Up: At [facility], you and your health needs are our priority.  As part of our continuing mission to provide you with exceptional heart care, we have created designated Provider Care Teams.  These Care Teams include your primary Cardiologist (physician) and Advanced Practice Providers (APPs -  Physician Assistants and Nurse Practitioners) who all work together to provide you with the care you need, when you need it. You will need a follow up appointment in 6 months.  Please call our office 2 months in advance to schedule this appointment.  You may see Suresh Koneswaran, MD or one of the following Advanced Practice Providers on your designated Care Team:   Brittany Strader, PA-C ([place] Office) . Michele Lenze, PA-C ([place] Office)  Any Other Special Instructions Will Be Listed Below (If Applicable).  NONE  

## 2017-12-13 NOTE — Progress Notes (Signed)
SUBJECTIVE: The patient presents for follow-up of coronary artery disease and atrial flutter. In summary, he underwent CABG in June 2010 with a LIMA to the LAD, SVG to the OM, SVG to the diagonal, and SVG to the RCA.  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  He has a right bundle branch block.  The patient denies any symptoms of chest pain, palpitations, shortness of breath, lightheadedness, dizziness, leg swelling, orthopnea, PND, and syncope.  He plans to begin raking his leaves in the near future.  He expects about 20 family members for Thanksgiving.   Review of Systems: As per "subjective", otherwise negative.  No Known Allergies  Current Outpatient Medications  Medication Sig Dispense Refill  . amLODipine (NORVASC) 5 MG tablet Take 1 tablet (5 mg total) by mouth daily. 90 tablet 3  . atorvastatin (LIPITOR) 20 MG tablet TAKE (1) TABLET BY MOUTH ONCE DAILY. 30 tablet 5  . cilostazol (PLETAL) 50 MG tablet TAKE (2) TABLETS BY MOUTH TWICE DAILY. 120 tablet 6  . escitalopram (LEXAPRO) 10 MG tablet Take 10 mg by mouth daily.    [name] glimepiride (AMARYL) 1 MG tablet Take 1 mg by mouth at bedtime.     [name] levothyroxine (SYNTHROID, LEVOTHROID) 100 MCG tablet Take 100 mcg by mouth daily before breakfast.     . losartan-hydrochlorothiazide (HYZAAR) 100-25 MG tablet Take 0.5 tablets by mouth daily. 30 tablet 5  . metoprolol tartrate (LOPRESSOR) 100 MG tablet Take 1 tablet (100 mg total) by mouth 2 (two) times daily. 180 tablet 3  . XARELTO 15 MG TABS tablet TAKE ONE TABLET BY MOUTH ONCE DAILY WITH SUPPER. 90 tablet 1   No current facility-administered  medications for this visit.     Past Medical History:  Diagnosis Date  . Chronic anticoagulation   . Chronic renal insufficiency, stage III (moderate) (HCC)   . Coronary artery disease 2010   CABG  . Dyslipidemia   . Hypertension 12/26/09   renal doppler  . Hypothyroidism   . PAF (paroxysmal atrial fibrillation) (HCC)   . Peripheral vascular disease (HCC) 08/01/10   Carotid doppler: 12/26/09 lower arterial doppler  . RBBB   . Shingles April 2012    Past Surgical History:  Procedure Laterality Date  . CARDIAC CATHETERIZATION  6/10  . CORONARY ARTERY BYPASS GRAFT  2010   3-vessel by-pass  . HERNIA REPAIR  3/09  . POPLITEAL ARTERY ANGIOPLASTY Right   . PROSTATE SURGERY  11/99  . RENAL ARTERY ANGIOPLASTY Left 9/11    Social History   Socioeconomic History  . Marital status: Married    Spouse name: Not on file  . Number of children: 2  . Years of education: Not on file  . Highest education level: Not on file  Occupational History  . Occupation: retired  Engineer, production  . Financial resource strain: Not on file  . Food insecurity:    Worry: Not on file    Inability: Not on file  . Transportation needs:    Medical: Not on file    Non-medical: Not on file  Tobacco Use  . Smoking status: Former Smoker    Packs/day: 1.00    Types: Cigarettes    Last attempt  to quit: 07/28/2008    Years since quitting: 9.3  . Smokeless tobacco: Never Used  Substance and Sexual Activity  . Alcohol use: No    Alcohol/week: 0.0 standard drinks  . Drug use: Not on file  . Sexual activity: Not on file  Lifestyle  . Physical activity:    Days per week: Not on file    Minutes per session: Not on file  . Stress: Not on file  Relationships  . Social connections:    Talks on phone: Not on file    Gets together: Not on file    Attends religious service: Not on file    Active member of club or organization: Not on file    Attends meetings of clubs or organizations: Not on file     Relationship status: Not on file  . Intimate partner violence:    Fear of current or ex partner: Not on file    Emotionally abused: Not on file    Physically abused: Not on file    Forced sexual activity: Not on file  Other Topics Concern  . Not on file  Social History Narrative  . Not on file     Vitals:   12/13/17 0859  BP: 106/66  Pulse: 94  SpO2: 97%  Weight: 165 lb (74.8 kg)  Height: 5\' 11"  (1.803 m)    Wt Readings from Last 3 Encounters:  12/13/17 165 lb (74.8 kg)  05/10/17 166 lb (75.3 kg)  01/03/17 163 lb (73.9 kg)     PHYSICAL EXAM General: NAD HEENT: Normal. Neck: No JVD, no thyromegaly. Lungs: Clear to auscultation bilaterally with normal respiratory effort. CV: Regular rate and rhythm, normal S1/split S2, no S3/S4, no murmur. No pretibial or periankle edema.  No carotid bruit.   Abdomen: Soft, nontender, no distention.  Neurologic: Alert and oriented.  Psych: Normal affect. Skin: Normal. Musculoskeletal: No gross deformities.    ECG: Reviewed above under Subjective   Labs: Lab Results  Component Value Date/Time   K 5.2 09/07/2016 09:18 AM   BUN 32 (H) 09/07/2016 09:18 AM   CREATININE 1.63 (H) 09/07/2016 09:18 AM   CREATININE 1.63 (H) 02/14/2014 11:16 AM   ALT 10 08/02/2016 08:40 AM   TSH 3.900 09/07/2016 09:18 AM   HGB 11.3 (L) 09/07/2016 09:18 AM     Lipids: Lab Results  Component Value Date/Time   LDLCALC 35 08/02/2016 08:40 AM   CHOL 85 (L) 08/02/2016 08:40 AM   TRIG 63 08/02/2016 08:40 AM   HDL 37 (L) 08/02/2016 08:40 AM       ASSESSMENT AND PLAN:  1.  Atrial flutter: Systemically anticoagulated with low-dose Xarelto, 15 mg.  Asymptomatic.  Heart rate is reasonably controlled on Lopressor 100 mg twice daily.  2.  Coronary artery disease status post CABG: Currently on Lipitor and metoprolol.  Not on aspirin presumably because he is on Xarelto.  Low risk nuclear stress test in 2013.  Symptomatically stable.  3.  Peripheral  arterial disease: He takes Lipitor.  He is not on aspirin as he is on Xarelto.    He is also on Pletal.  Symptomatically stable.  He tries to stay active.  4.  Hypertension: Blood pressure is normal.    No changes to therapy.  5.  Hyperlipidemia: He is on Lipitor.  LDL 32 in December 2018.   Disposition: Follow up 6 months   Prentice Docker, M.D., F.A.C.C.

## 2017-12-15 ENCOUNTER — Other Ambulatory Visit: Payer: Self-pay | Admitting: Cardiovascular Disease

## 2017-12-15 NOTE — Telephone Encounter (Signed)
Rx request sent to pharmacy.  

## 2018-01-09 ENCOUNTER — Telehealth: Payer: Self-pay

## 2018-01-09 ENCOUNTER — Other Ambulatory Visit: Payer: Self-pay | Admitting: Cardiovascular Disease

## 2018-01-09 DIAGNOSIS — E039 Hypothyroidism, unspecified: Secondary | ICD-10-CM

## 2018-01-09 NOTE — Telephone Encounter (Signed)
Called pt to schedule bmet at [facility] for xarelto refill

## 2018-01-12 DIAGNOSIS — Z1389 Encounter for screening for other disorder: Secondary | ICD-10-CM | POA: Diagnosis not present

## 2018-01-12 DIAGNOSIS — Z0001 Encounter for general adult medical examination with abnormal findings: Secondary | ICD-10-CM | POA: Diagnosis not present

## 2018-01-12 DIAGNOSIS — E063 Autoimmune thyroiditis: Secondary | ICD-10-CM | POA: Diagnosis not present

## 2018-01-12 DIAGNOSIS — Z0000 Encounter for general adult medical examination without abnormal findings: Secondary | ICD-10-CM | POA: Diagnosis not present

## 2018-01-12 DIAGNOSIS — E1151 Type 2 diabetes mellitus with diabetic peripheral angiopathy without gangrene: Secondary | ICD-10-CM | POA: Diagnosis not present

## 2018-01-12 DIAGNOSIS — E1165 Type 2 diabetes mellitus with hyperglycemia: Secondary | ICD-10-CM | POA: Diagnosis not present

## 2018-01-12 DIAGNOSIS — Z6823 Body mass index (BMI) 23.0-23.9, adult: Secondary | ICD-10-CM | POA: Diagnosis not present

## 2018-01-16 ENCOUNTER — Other Ambulatory Visit: Payer: Self-pay | Admitting: Cardiovascular Disease

## 2018-02-10 ENCOUNTER — Other Ambulatory Visit: Payer: Self-pay | Admitting: Cardiovascular Disease

## 2018-02-16 ENCOUNTER — Other Ambulatory Visit: Payer: Self-pay | Admitting: Physician Assistant

## 2018-02-16 NOTE — Telephone Encounter (Signed)
This is Dr. Berry's pt 

## 2018-03-15 ENCOUNTER — Other Ambulatory Visit: Payer: Self-pay | Admitting: Cardiovascular Disease

## 2018-04-13 ENCOUNTER — Other Ambulatory Visit: Payer: Self-pay | Admitting: Cardiovascular Disease

## 2018-04-17 DIAGNOSIS — Z1389 Encounter for screening for other disorder: Secondary | ICD-10-CM | POA: Diagnosis not present

## 2018-04-17 DIAGNOSIS — Z6823 Body mass index (BMI) 23.0-23.9, adult: Secondary | ICD-10-CM | POA: Diagnosis not present

## 2018-04-17 DIAGNOSIS — E119 Type 2 diabetes mellitus without complications: Secondary | ICD-10-CM | POA: Diagnosis not present

## 2018-05-04 ENCOUNTER — Telehealth: Payer: Self-pay

## 2018-05-04 NOTE — Telephone Encounter (Signed)
   Cardiac Questionnaire:    Since your last visit or hospitalization:    1. Have you been having new or worsening chest pain? no   2. Have you been having new or worsening shortness of breath? no 3. Have you been having new or worsening leg swelling, wt gain, or increase in abdominal girth (pants fitting more tightly)? no   4. Have you had any passing out spells? no      I spoke with patient, he states he is doing well. He prefers to r/s visit with Dr.Koneswaran than to have tele visit or webex apt

## 2018-05-08 ENCOUNTER — Other Ambulatory Visit: Payer: Self-pay | Admitting: Cardiovascular Disease

## 2018-05-16 ENCOUNTER — Ambulatory Visit: Payer: Medicare Other | Admitting: Cardiovascular Disease

## 2018-06-12 ENCOUNTER — Other Ambulatory Visit: Payer: Self-pay | Admitting: Cardiovascular Disease

## 2018-07-18 ENCOUNTER — Other Ambulatory Visit: Payer: Self-pay | Admitting: Cardiovascular Disease

## 2018-08-18 ENCOUNTER — Other Ambulatory Visit: Payer: Self-pay | Admitting: Cardiovascular Disease

## 2018-09-15 ENCOUNTER — Ambulatory Visit (INDEPENDENT_AMBULATORY_CARE_PROVIDER_SITE_OTHER): Payer: Medicare Other | Admitting: Cardiovascular Disease

## 2018-09-15 ENCOUNTER — Encounter: Payer: Self-pay | Admitting: Cardiovascular Disease

## 2018-09-15 ENCOUNTER — Other Ambulatory Visit: Payer: Self-pay

## 2018-09-15 VITALS — BP 107/66 | HR 100 | Temp 97.3°F | Ht 70.0 in | Wt 171.0 lb

## 2018-09-15 DIAGNOSIS — E785 Hyperlipidemia, unspecified: Secondary | ICD-10-CM | POA: Diagnosis not present

## 2018-09-15 DIAGNOSIS — I2510 Atherosclerotic heart disease of native coronary artery without angina pectoris: Secondary | ICD-10-CM | POA: Diagnosis not present

## 2018-09-15 DIAGNOSIS — I25708 Atherosclerosis of coronary artery bypass graft(s), unspecified, with other forms of angina pectoris: Secondary | ICD-10-CM

## 2018-09-15 DIAGNOSIS — I10 Essential (primary) hypertension: Secondary | ICD-10-CM | POA: Diagnosis not present

## 2018-09-15 DIAGNOSIS — I6522 Occlusion and stenosis of left carotid artery: Secondary | ICD-10-CM

## 2018-09-15 DIAGNOSIS — I4892 Unspecified atrial flutter: Secondary | ICD-10-CM | POA: Diagnosis not present

## 2018-09-15 DIAGNOSIS — I739 Peripheral vascular disease, unspecified: Secondary | ICD-10-CM

## 2018-09-15 NOTE — Progress Notes (Signed)
SUBJECTIVE: The patient presents for follow-up of coronary artery disease and atrial flutter. In summary, he underwent CABG in June 2010 with a LIMA to the LAD, SVG to the OM, SVG to the diagonal, and SVG to the RCA. 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  He has a right bundle branch block.  He said overall he has been feeling well.  He denies chest pain.  He has some left shoulder arthritis which he thinks got aggravated as he was doing a lot of yard work yesterday.  He has some leg fatigue bilaterally which has not changed.  He denies palpitations and leg swelling.    Review of Systems: As per "subjective", otherwise negative.  No Known Allergies  Current Outpatient Medications  Medication Sig Dispense Refill  . amLODipine (NORVASC) 5 MG tablet TAKE (1) TABLET BY MOUTH ONCE DAILY. 90 tablet 3  . atorvastatin (LIPITOR) 20 MG tablet TAKE (1) TABLET BY MOUTH ONCE DAILY. 30 tablet 1  . cilostazol (PLETAL) 50 MG tablet TAKE (2) TABLETS BY MOUTH TWICE DAILY. 120 tablet 11  . escitalopram (LEXAPRO) 10 MG tablet Take 10 mg by mouth daily.    [name]. glimepiride (AMARYL) 1 MG tablet Take 1 mg by mouth at bedtime.     [name]. levothyroxine (SYNTHROID) 112 MCG tablet Take 112 mcg by mouth daily before breakfast.     . losartan-hydrochlorothiazide (HYZAAR) 50-12.5 MG tablet TAKE (1) TABLET BY MOUTH ONCE DAILY. 15 tablet 11  . metoprolol tartrate (LOPRESSOR) 100 MG tablet TAKE (1) TABLET BY MOUTH TWICE DAILY. 180 tablet 1  . pioglitazone (ACTOS) 15 MG tablet Take 15 mg by mouth daily.     [name]. XARELTO 15 MG TABS tablet TAKE ONE TABLET BY MOUTH ONCE DAILY WITH SUPPER. 30  tablet 0   No current facility-administered medications for this visit.     Past Medical History:  Diagnosis Date  . Chronic anticoagulation   . Chronic renal insufficiency, stage III (moderate) (HCC)   . Coronary artery disease 2010   CABG  . Dyslipidemia   . Hypertension 12/26/09   renal doppler  . Hypothyroidism   . PAF (paroxysmal atrial fibrillation) (HCC)   . Peripheral vascular disease (HCC) 08/01/10   Carotid doppler: 12/26/09 lower arterial doppler  . RBBB   . Shingles April 2012    Past Surgical History:  Procedure Laterality Date  . CARDIAC CATHETERIZATION  6/10  . CORONARY ARTERY BYPASS GRAFT  2010   3-vessel by-pass  . HERNIA REPAIR  3/09  . POPLITEAL ARTERY ANGIOPLASTY Right   . PROSTATE SURGERY  11/99  . RENAL ARTERY ANGIOPLASTY Left 9/11    Social History   Socioeconomic History  . Marital status: Married    Spouse name: Not on file  . Number of children: 2  . Years of education: Not on file  . Highest education level: Not on file  Occupational History  . Occupation: retired  Engineer, productionocial Needs  . Financial resource strain: Not on file  . Food insecurity    Worry: Not on file    Inability: Not on file  . Transportation needs    Medical: Not on file    Non-medical: Not on file  Tobacco Use  .  Smoking status: Former Smoker    Packs/day: 1.00    Types: Cigarettes    Quit date: 07/28/2008    Years since quitting: 10.1  . Smokeless tobacco: Never Used  Substance and Sexual Activity  . Alcohol use: No    Alcohol/week: 0.0 standard drinks  . Drug use: Not on file  . Sexual activity: Not on file  Lifestyle  . Physical activity    Days per week: Not on file    Minutes per session: Not on file  . Stress: Not on file  Relationships  . Social Herbalist on phone: Not on file    Gets together: Not on file    Attends religious service: Not on file    Active member of club or organization: Not on file    Attends meetings of clubs or  organizations: Not on file    Relationship status: Not on file  . Intimate partner violence    Fear of current or ex partner: Not on file    Emotionally abused: Not on file    Physically abused: Not on file    Forced sexual activity: Not on file  Other Topics Concern  . Not on file  Social History Narrative  . Not on file     Vitals:   09/15/18 1337  BP: 107/66  Pulse: 100  Temp: (!) 97.3 F (36.3 C)  TempSrc: Temporal  SpO2: 94%  Weight: 171 lb (77.6 kg)  Height: 5\' 10"  (1.778 m)    Wt Readings from Last 3 Encounters:  09/15/18 171 lb (77.6 kg)  12/13/17 165 lb (74.8 kg)  05/10/17 166 lb (75.3 kg)     PHYSICAL EXAM General: NAD HEENT: Normal. Neck: No JVD, no thyromegaly. Lungs: Clear to auscultation bilaterally with normal respiratory effort. CV: Heart rate in upper normal limits, regular rhythm, normal S1/S2, no S3/S4, no murmur. No pretibial or periankle edema.  No carotid bruit.   Abdomen: Soft, nontender, no distention.  Neurologic: Alert and oriented.  Psych: Normal affect. Skin: Normal. Musculoskeletal: No gross deformities.    ECG: Reviewed above under Subjective   Labs: Lab Results  Component Value Date/Time   K 5.2 09/07/2016 09:18 AM   BUN 32 (H) 09/07/2016 09:18 AM   CREATININE 1.63 (H) 09/07/2016 09:18 AM   CREATININE 1.63 (H) 02/14/2014 11:16 AM   ALT 10 08/02/2016 08:40 AM   TSH 3.900 09/07/2016 09:18 AM   HGB 11.3 (L) 09/07/2016 09:18 AM     Lipids: Lab Results  Component Value Date/Time   LDLCALC 35 08/02/2016 08:40 AM   CHOL 85 (L) 08/02/2016 08:40 AM   TRIG 63 08/02/2016 08:40 AM   HDL 37 (L) 08/02/2016 08:40 AM       ASSESSMENT AND PLAN:  1. Atrial flutter: Systemically anticoagulated with low-dose Xarelto 15 mg. Asymptomatic.  Heart rate is 100 bpm on Lopressor 100 mg twice daily.  He would like to continue this dose as he is feeling well.  2. Coronary artery disease status post CABG: Currently on Lipitor and  metoprolol. Not on aspirin as he is on Xarelto. Low risk nuclear stress test in 2013. Symptomatically stable.  3. Peripheral arterial disease: He takes Lipitor. He is not on aspirin as he is on Xarelto.   He is also on Pletal.  Symptomatically stable.  He tries to stay active.  4. Hypertension: Blood pressure is normal.   No changes to therapy.  5. Hyperlipidemia: He is on Lipitor.  I  will obtain labs from PCP.  6.  Carotid artery stenosis: Last carotid Dopplers in December 2016 showed 100% left internal carotid artery occlusion and 1 to 39% right internal carotid artery stenosis.  I will repeat Dopplers.   Disposition: Follow up 6 months   Prentice DockerSuresh Deion Swift, M.D., F.A.C.C.

## 2018-09-15 NOTE — Patient Instructions (Signed)
Medication Instructions:  Your physician recommends that you continue on your current medications as directed. Please refer to the Current Medication list given to you today.   Labwork: none  Testing/Procedures: Your physician has requested that you have a carotid duplex. This test is an ultrasound of the carotid arteries in your neck. It looks at blood flow through these arteries that supply the brain with blood. Allow one hour for this exam. There are no restrictions or special instructions.    Follow-Up: Your physician wants you to follow-up in: 6 months.  You will receive a reminder letter in the mail two months in advance. If you don't receive a letter, please call our office to schedule the follow-up appointment.   Any Other Special Instructions Will Be Listed Below (If Applicable).     If you need a refill on your cardiac medications before your next appointment, please call your pharmacy.   

## 2018-09-15 NOTE — Addendum Note (Signed)
Addended byDebbora Lacrosse R on: 09/15/2018 02:10 PM   Modules accepted: Orders

## 2018-09-18 ENCOUNTER — Other Ambulatory Visit: Payer: Self-pay | Admitting: Cardiovascular Disease

## 2018-09-20 ENCOUNTER — Other Ambulatory Visit: Payer: Self-pay

## 2018-09-20 ENCOUNTER — Ambulatory Visit (HOSPITAL_COMMUNITY)
Admission: RE | Admit: 2018-09-20 | Discharge: 2018-09-20 | Disposition: A | Discharge status: Home / Self Care | Payer: Medicare Other | Source: Ambulatory Visit | Attending: Cardiovascular Disease | Admitting: Cardiovascular Disease

## 2018-09-20 DIAGNOSIS — Z6824 Body mass index (BMI) 24.0-24.9, adult: Secondary | ICD-10-CM | POA: Diagnosis not present

## 2018-09-20 DIAGNOSIS — M25511 Pain in right shoulder: Secondary | ICD-10-CM | POA: Diagnosis not present

## 2018-09-20 DIAGNOSIS — M25512 Pain in left shoulder: Secondary | ICD-10-CM | POA: Diagnosis not present

## 2018-09-20 DIAGNOSIS — E1151 Type 2 diabetes mellitus with diabetic peripheral angiopathy without gangrene: Secondary | ICD-10-CM | POA: Diagnosis not present

## 2018-09-20 DIAGNOSIS — I2510 Atherosclerotic heart disease of native coronary artery without angina pectoris: Secondary | ICD-10-CM | POA: Diagnosis not present

## 2018-09-20 DIAGNOSIS — I10 Essential (primary) hypertension: Secondary | ICD-10-CM | POA: Diagnosis not present

## 2018-09-20 DIAGNOSIS — I6523 Occlusion and stenosis of bilateral carotid arteries: Secondary | ICD-10-CM | POA: Diagnosis not present

## 2018-09-21 ENCOUNTER — Telehealth: Payer: Self-pay | Admitting: *Deleted

## 2018-09-21 NOTE — Telephone Encounter (Signed)
Called patient with test results. No answer. Left message to call back.  

## 2018-09-21 NOTE — Telephone Encounter (Signed)
-----   Message from Herminio Commons, MD sent at 09/20/2018  2:13 PM EDT ----- Chronically occluded left internal carotid artery. Mild right sided blockage.

## 2018-10-11 ENCOUNTER — Other Ambulatory Visit: Payer: Self-pay | Admitting: Cardiovascular Disease

## 2018-12-11 ENCOUNTER — Other Ambulatory Visit: Payer: Self-pay | Admitting: Cardiovascular Disease

## 2018-12-29 ENCOUNTER — Telehealth: Payer: Self-pay | Admitting: Cardiovascular Disease

## 2018-12-29 NOTE — Telephone Encounter (Signed)
New Message   *STAT* If patient is at the pharmacy, call can be transferred to refill team.   1. Which medications need to be refilled? (please list name of each medication and dose if known) cilostazol (PLETAL) 50 MG tablet  2. Which pharmacy/location (including street and city if local pharmacy) is medication to be sent to? [place]  3. Do they need a 30 day or 90 day supply? 30  Change to 100 mg tablet

## 2018-12-29 NOTE — Telephone Encounter (Signed)
That is fine with me.

## 2018-12-29 NOTE — Telephone Encounter (Signed)
Received telephone call from [place], [place] . He is wanting to know if the medicatio cilostazol (PLETAL) 50 MG tablet  Can be switched to 100 mg twice daily.  Please call Earnest Bailey [phone].

## 2018-12-29 NOTE — Telephone Encounter (Signed)
Prescribed by Dr. Gwenlyn Found. Would ask him.

## 2018-12-29 NOTE — Telephone Encounter (Signed)
Okay to switch to 100 mg twice daily instead of 50 mg two tablets twice daily. Thank you!

## 2019-01-01 MED ORDER — CILOSTAZOL 100 MG PO TABS: 100.0000 mg | ORAL_TABLET | Freq: Two times a day (BID) | ORAL | 1 refills | Status: DC

## 2019-01-01 NOTE — Telephone Encounter (Signed)
Sent in RX to pharmacy  ° °

## 2019-01-08 DIAGNOSIS — I129 Hypertensive chronic kidney disease with stage 1 through stage 4 chronic kidney disease, or unspecified chronic kidney disease: Secondary | ICD-10-CM | POA: Diagnosis not present

## 2019-01-08 DIAGNOSIS — F329 Major depressive disorder, single episode, unspecified: Secondary | ICD-10-CM | POA: Diagnosis not present

## 2019-01-08 DIAGNOSIS — E1122 Type 2 diabetes mellitus with diabetic chronic kidney disease: Secondary | ICD-10-CM | POA: Diagnosis not present

## 2019-01-08 DIAGNOSIS — E1140 Type 2 diabetes mellitus with diabetic neuropathy, unspecified: Secondary | ICD-10-CM | POA: Diagnosis not present

## 2019-01-08 DIAGNOSIS — N1832 Chronic kidney disease, stage 3b: Secondary | ICD-10-CM | POA: Diagnosis not present

## 2019-01-16 DIAGNOSIS — Z0000 Encounter for general adult medical examination without abnormal findings: Secondary | ICD-10-CM | POA: Diagnosis not present

## 2019-01-16 DIAGNOSIS — M7552 Bursitis of left shoulder: Secondary | ICD-10-CM | POA: Diagnosis not present

## 2019-01-16 DIAGNOSIS — E1151 Type 2 diabetes mellitus with diabetic peripheral angiopathy without gangrene: Secondary | ICD-10-CM | POA: Diagnosis not present

## 2019-01-16 DIAGNOSIS — Z23 Encounter for immunization: Secondary | ICD-10-CM | POA: Diagnosis not present

## 2019-01-16 DIAGNOSIS — Z6824 Body mass index (BMI) 24.0-24.9, adult: Secondary | ICD-10-CM | POA: Diagnosis not present

## 2019-01-16 DIAGNOSIS — M7551 Bursitis of right shoulder: Secondary | ICD-10-CM | POA: Diagnosis not present

## 2019-01-16 DIAGNOSIS — E7849 Other hyperlipidemia: Secondary | ICD-10-CM | POA: Diagnosis not present

## 2019-01-16 DIAGNOSIS — Z1389 Encounter for screening for other disorder: Secondary | ICD-10-CM | POA: Diagnosis not present

## 2019-01-16 DIAGNOSIS — E063 Autoimmune thyroiditis: Secondary | ICD-10-CM | POA: Diagnosis not present

## 2019-01-16 DIAGNOSIS — E119 Type 2 diabetes mellitus without complications: Secondary | ICD-10-CM | POA: Diagnosis not present

## 2019-01-16 DIAGNOSIS — E782 Mixed hyperlipidemia: Secondary | ICD-10-CM | POA: Diagnosis not present

## 2019-02-07 ENCOUNTER — Other Ambulatory Visit: Payer: Self-pay | Admitting: Cardiovascular Disease

## 2019-02-15 ENCOUNTER — Ambulatory Visit
Admission: EM | Admit: 2019-02-15 | Discharge: 2019-02-15 | Disposition: A | Discharge status: Home / Self Care | Payer: Medicare Other | Attending: Emergency Medicine | Admitting: Emergency Medicine

## 2019-02-15 ENCOUNTER — Other Ambulatory Visit: Payer: Self-pay

## 2019-02-15 DIAGNOSIS — L0211 Cutaneous abscess of neck: Secondary | ICD-10-CM | POA: Diagnosis not present

## 2019-02-15 MED ORDER — CEPHALEXIN 500 MG PO CAPS: 500.0000 mg | ORAL_CAPSULE | Freq: Four times a day (QID) | ORAL | 0 refills | Status: DC

## 2019-02-15 NOTE — ED Triage Notes (Signed)
Pt presents to UC w/ c/o abscess on back of neck x1 week. Pt has been taking amoxicillin for 3 days. Pt states he wants to see if he needs it lanced or if he needs a stronger antibiotic.

## 2019-02-15 NOTE — ED Provider Notes (Signed)
[facility]    CSN: [id number] Arrival date & time: 02/15/19  1827      History   Chief Complaint Chief Complaint  Patient presents with  . Abscess    HPI Scott [name] is a 86 y.o. male.   Scott [name] 86 years old male presented to the urgent care with a complaint of  neck abscess for the past 1 week.  Reported drainage of White color and redness.  Reports waking up with a small scab that developed into abscess.  He is using OTC Tylenol with amoxicillin with mild relief.  Nothing make his symptoms worse.  Denies chills, fever, nausea, vomiting, diarrhea, chest pain, chest tightness, confusion.   The history is provided by the patient. No language interpreter was used.  Abscess   Past Medical History:  Diagnosis Date  . Chronic anticoagulation   . Chronic renal insufficiency, stage III (moderate)   . Coronary artery disease 2010   CABG  . Dyslipidemia   . Hypertension 12/26/09   renal doppler  . Hypothyroidism   . PAF (paroxysmal atrial fibrillation) (HCC)   . Peripheral vascular disease (HCC) 08/01/10   Carotid doppler: 12/26/09 lower arterial doppler  . RBBB   . Shingles April 2012    Patient Active Problem List   Diagnosis Date Noted  . Carotid artery disease (HCC) 12/01/2016  . CAD- CABG 6/10, low risk Myoview 8/13   . Hypothyroidism   . RBBB   . Chronic anticoagulation   . Chronic renal insufficiency, stage III (moderate)   . PAF (paroxysmal atrial fibrillation) (HCC)   . Dyslipidemia   . Peripheral vascular disease (HCC) 08/01/2010  . Hypertension 12/26/2009    Past Surgical History:  Procedure Laterality Date  . CARDIAC CATHETERIZATION  6/10  . CORONARY ARTERY BYPASS GRAFT  2010   3-vessel by-pass  . HERNIA REPAIR  3/09  . POPLITEAL ARTERY ANGIOPLASTY Right   . PROSTATE SURGERY  11/99  . RENAL ARTERY ANGIOPLASTY Left 9/11       Home Medications    Prior to Admission medications   Medication Sig Start Date End Date Taking?  Authorizing Provider  amLODipine (NORVASC) 5 MG tablet TAKE (1) TABLET BY MOUTH ONCE DAILY. 02/07/19   Laqueta Linden, MD  atorvastatin (LIPITOR) 20 MG tablet TAKE (1) TABLET BY MOUTH ONCE DAILY. 02/07/19   Laqueta Linden, MD  cephALEXin (KEFLEX) 500 MG capsule Take 1 capsule (500 mg total) by mouth 4 (four) times daily. 02/15/19   Karey Stucki, Zachery Dakins, FNP  cilostazol (PLETAL) 100 MG tablet Take 1 tablet (100 mg total) by mouth 2 (two) times daily. 01/01/19   Runell Gess, MD  escitalopram (LEXAPRO) 10 MG tablet Take 10 mg by mouth daily.    [provider]  glimepiride (AMARYL) 1 MG tablet Take 1 mg by mouth at bedtime.  04/11/17   [provider]  levothyroxine (SYNTHROID) 112 MCG tablet Take 112 mcg by mouth daily before breakfast.  08/18/18   [provider]  losartan-hydrochlorothiazide (HYZAAR) 50-12.5 MG tablet TAKE (1) TABLET BY MOUTH ONCE DAILY. 02/16/18   Barrett, Joline Salt, PA-C  metoprolol tartrate (LOPRESSOR) 100 MG tablet TAKE (1) TABLET BY MOUTH TWICE DAILY. 12/11/18   Laqueta Linden, MD  pioglitazone (ACTOS) 15 MG tablet Take 15 mg by mouth daily.  07/11/18   [provider]  XARELTO 15 MG TABS tablet TAKE ONE TABLET BY MOUTH ONCE DAILY WITH SUPPER. 09/18/18   Laqueta Linden,  MD    Family History Family History  Problem Relation Age of Onset  . CVA Mother 62  . Stroke Mother   . Diabetes Father   . CAD Brother     Social History Social History   Tobacco Use  . Smoking status: Former Smoker    Packs/day: 1.00    Types: Cigarettes    Quit date: 07/28/2008    Years since quitting: 10.5  . Smokeless tobacco: Never Used  Substance Use Topics  . Alcohol use: No    Alcohol/week: 0.0 standard drinks  . Drug use: Not on file     Allergies   Patient has no known allergies.   Review of Systems Review of Systems  Constitutional: Negative.   Respiratory: Negative.   Cardiovascular: Negative.   Skin: Positive for  color change.     Physical Exam Triage Vital Signs ED Triage Vitals  Enc Vitals Group     BP 02/15/19 1844 (!) 86/49     Pulse Rate 02/15/19 1844 (!) 120     Resp 02/15/19 1844 18     Temp 02/15/19 1844 (!) 97.5 F (36.4 C)     Temp Source 02/15/19 1844 Oral     SpO2 02/15/19 1844 98 %     Weight --      Height --      Head Circumference --      Peak Flow --      Pain Score 02/15/19 1840 5     Pain Loc --      Pain Edu? --      Excl. in GC? --    No data found.  Updated Vital Signs BP (!) 86/49 (BP Location: Right Arm)   Pulse (!) 120   Temp (!) 97.5 F (36.4 C) (Oral)   Resp 18   SpO2 98%   Visual Acuity Right Eye Distance:   Left Eye Distance:   Bilateral Distance:    Right Eye Near:   Left Eye Near:    Bilateral Near:     Physical Exam Nursing note reviewed.  Constitutional:      General: He is not in acute distress.    Appearance: Normal appearance. He is normal weight. He is not ill-appearing or toxic-appearing.  Cardiovascular:     Rate and Rhythm: Normal rate and regular rhythm.     Pulses: Normal pulses.     Heart sounds: Normal heart sounds.  Pulmonary:     Effort: Pulmonary effort is normal. No respiratory distress.     Breath sounds: Normal breath sounds. No wheezing, rhonchi or rales.  Chest:     Chest wall: No tenderness.  Skin:    General: Skin is warm.     Capillary Refill: Capillary refill takes less than 2 seconds.     Findings: Abscess and erythema present.     Comments: Abscess present  Neurological:     Mental Status: He is alert.      UC Treatments / Results  Labs (all labs ordered are listed, but only abnormal results are displayed) Labs Reviewed - No data to display  EKG   Radiology No results found.  Procedures Procedures (I&D completed at 02/15/2019 at 1925) Verbal consent obtained. Area over induration cleaned with betadine. Lidocaine 2% with epinephrine used to obtain local anesthesia. The most fluctuant  portion of the abscess was incised with a #11 blade scalpel. Abscess cavity explored.  A small amount of exudate was expressed.  The wound was cleaned  with normal saline.  The wound was left open.  Bacitracin was applied. Minimal bleeding. No complications.  Patient tolerated well.  Medications Ordered in UC Medications - No data to display  Initial Impression / Assessment and Plan / UC Course  I have reviewed the triage vital signs and the nursing notes.  Pertinent labs & imaging results that were available during my care of the patient were reviewed by me and considered in my medical decision making (see chart for details).    Patient stable for discharge.  Advised patient to take antibiotic as prescribed and to completion.  To return for worsening of symptoms.  Patient verbalized understanding of the plan of care.  Final Clinical Impressions(s) / UC Diagnoses   Final diagnoses:  Abscess, neck     Discharge Instructions     Apply warm compresses 3-4x daily for 10-15 minutes Wash site daily with warm water and mild soap Keep covered to avoid friction Take antibiotic as prescribed and to completion Follow up here or with PCP if symptoms persists Return or go to the ED if you have any new or worsening symptoms      ED Prescriptions    Medication Sig Dispense Auth. Provider   cephALEXin (KEFLEX) 500 MG capsule Take 1 capsule (500 mg total) by mouth 4 (four) times daily. 20 capsule Yovan Leeman, Darrelyn Hillock, FNP     PDMP not reviewed this encounter.   Emerson Monte, FNP 02/15/19 1942

## 2019-02-19 DIAGNOSIS — Z6823 Body mass index (BMI) 23.0-23.9, adult: Secondary | ICD-10-CM | POA: Diagnosis not present

## 2019-02-19 DIAGNOSIS — Z1389 Encounter for screening for other disorder: Secondary | ICD-10-CM | POA: Diagnosis not present

## 2019-02-19 DIAGNOSIS — L03811 Cellulitis of head [any part, except face]: Secondary | ICD-10-CM | POA: Diagnosis not present

## 2019-02-21 DIAGNOSIS — L03818 Cellulitis of other sites: Secondary | ICD-10-CM | POA: Diagnosis not present

## 2019-02-22 ENCOUNTER — Other Ambulatory Visit: Payer: Self-pay | Admitting: Physician Assistant

## 2019-03-01 ENCOUNTER — Encounter (HOSPITAL_COMMUNITY): Payer: Self-pay

## 2019-03-01 ENCOUNTER — Ambulatory Visit (HOSPITAL_COMMUNITY): Payer: Medicare Other | Admitting: Physical Therapy

## 2019-03-02 ENCOUNTER — Ambulatory Visit (HOSPITAL_COMMUNITY): Payer: Medicare Other | Admitting: Physical Therapy

## 2019-03-02 ENCOUNTER — Other Ambulatory Visit: Payer: Self-pay

## 2019-03-02 ENCOUNTER — Ambulatory Visit (HOSPITAL_COMMUNITY): Payer: Medicare Other | Attending: Physician Assistant | Admitting: Physical Therapy

## 2019-03-02 ENCOUNTER — Encounter (HOSPITAL_COMMUNITY): Payer: Self-pay | Admitting: Physical Therapy

## 2019-03-02 DIAGNOSIS — S1190XD Unspecified open wound of unspecified part of neck, subsequent encounter: Secondary | ICD-10-CM | POA: Diagnosis not present

## 2019-03-02 DIAGNOSIS — M436 Torticollis: Secondary | ICD-10-CM

## 2019-03-02 NOTE — Therapy (Signed)
[facility] [address] [place], [place], [address] Phone: [phone]   Fax:  [phone]  Wound Care Evaluation  Patient Details  Name: Scott [name] MRN: [id number] Date of Birth: Nov 02, 1929 No data recorded  Encounter Date: 03/02/2019  PT End of Session - 03/02/19 1431    Visit Number  1    Number of Visits  8    Date for PT Re-Evaluation  04/01/19    Authorization Type  Medicare + BCBS    Authorization Time Period  1/22 thru 04/01/2019    Authorization - Visit Number  1    Authorization - Number of Visits  10    PT Start Time  0900    PT Stop Time  0940    PT Time Calculation (min)  40 min    Activity Tolerance  Patient tolerated treatment well    Behavior During Therapy  [facility] for tasks assessed/performed       Past Medical History:  Diagnosis Date  . Chronic anticoagulation   . Chronic renal insufficiency, stage III (moderate)   . Coronary artery disease 2010   CABG  . Dyslipidemia   . Hypertension 12/26/09   renal doppler  . Hypothyroidism   . PAF (paroxysmal atrial fibrillation) (HCC)   . Peripheral vascular disease (HCC) 08/01/10   Carotid doppler: 12/26/09 lower arterial doppler  . RBBB   . Shingles April 2012    Past Surgical History:  Procedure Laterality Date  . CARDIAC CATHETERIZATION  6/10  . CORONARY ARTERY BYPASS GRAFT  2010   3-vessel by-pass  . HERNIA REPAIR  3/09  . POPLITEAL ARTERY ANGIOPLASTY Right   . PROSTATE SURGERY  11/99  . RENAL ARTERY ANGIOPLASTY Left 9/11    There were no vitals filed for this visit.     Wound Therapy - 03/02/19 1416    Subjective  Pt states that he noticed a small knot on the back of his neck on January 1st.  The area kept getting bigger therefore her had an e-visit with his MD who gave him some antibiotics.  There was no improvement therefore he went to urgent care who gave him a stronger antibiotic.  There was still no improvement therefore he went to his MD  office who had him come in three days in a row for an antibiotic injection and then placed him on a sulfer antibiotic and referred him to this facility.     Patient and Family Stated Goals  wound to heal     Date of Onset  02/09/19    Prior Treatments  see above, wife is changing dressings daily     Pain Scale  0-10    Pain Score  0-No pain    Evaluation and Treatment Procedures Explained to Patient/Family  Yes    Evaluation and Treatment Procedures  agreed to    Wound Properties Date First Assessed: 03/02/19 Time First Assessed: 0925 Wound Type: Other (Comment) Location: Cervical Location Orientation: Posterior Present on Admission: Yes   Dressing Type  Gauze (Comment)    Dressing Changed  Changed    Dressing Status  New drainage;Old drainage    Dressing Change Frequency  PRN    Site / Wound Assessment  Granulation tissue;Other (Comment)    % Wound base Red or Granulating  --   white.    % Wound base Yellow/Fibrinous Exudate  75%    % Wound base Other/Granulation Tissue (Comment)  25%    Peri-wound  Assessment  Intact    Wound Length (cm)  2.8 cm    Wound Width (cm)  2.6 cm   2.6 superior aspect; 1.8 inferior    Wound Depth (cm)  0 cm    Wound Volume (cm^3)  0 cm^3    Wound Surface Area (cm^2)  7.28 cm^2    Drainage Amount  Minimal    Drainage Description  Serous    Treatment  Cleansed;Debridement (Selective);Other (Comment)    Selective Debridement - Location  --   eschar   Selective Debridement - Tools Used  Scalpel    Selective Debridement - Tissue Removed  --   scored area as it is very adherent, followed by medihoney    Wound Therapy - Clinical Statement  Scott [name] is an 86 yo who has a none healing abcess on the back of his neck.  The wound was infected but at this time there is no sign of infection.  The wound is slightly hypergranulated with 75% eschar.  Scott [name] will benefit from skilled physical therapy for sharp debridement to eschar and pressure dressing to decrease  hypergranulation.      Wound Therapy - Functional Problem List  Decreased cervical ROM due to pt hesitation to move cervical area.     Factors Delaying/Impairing Wound Healing  Polypharmacy    Hydrotherapy Plan  Debridement;Dressing change;Patient/family education    Wound Therapy - Frequency  2X / week   for four weeks    Wound Therapy - Current Recommendations  PT    Wound Plan  Continue to see pt twice a week until eschar is gone at that time may decrease to once a week.     Dressing   medihoney f/b 2x2, AB pad secured with pressure from medipore tape.              Objective measurements completed on examination: See above findings.            PT Education - 03/02/19 1430    Education Details  keep dressing dry, if drainage is comming thru then change the dressing otherwise keep the dressing on.  Quit using alcohol on wound.    Person(s) Educated  Patient    Methods  Explanation    Comprehension  Verbalized understanding       PT Short Term Goals - 03/02/19 1435      PT SHORT TERM GOAL #1   Title  PT wound to be 80% granulated    Baseline  25    Time  2    Period  Weeks    Status  New    Target Date  03/16/19      PT SHORT TERM GOAL #2   Title  Wound size to have decreased 1 cm in width and length.    Time  2    Period  Weeks    Status  New        PT Long Term Goals - 03/02/19 1436      PT LONG TERM GOAL #1   Title  Wound to be 100% granulated with scant drainage    Time  4    Period  Weeks    Status  New    Target Date  03/30/19      PT LONG TERM GOAL #2   Title  wound to be no greater than .5x.5 to allow pt to complete self care by using a bandaid at home.    Time  4  Period  Weeks    Status  New           Plan - 03/02/19 1433    Clinical Impression Statement  see above    Personal Factors and Comorbidities  Age;Comorbidity 2    Comorbidities  HTN, CAD, PVD,    Examination-Activity Limitations  Bathing;Dressing     Stability/Clinical Decision Making  Stable/Uncomplicated    Clinical Decision Making  Low    Rehab Potential  Good    PT Frequency  2x / week    PT Duration  4 weeks    PT Treatment/Interventions  ADLs/Self Care Home Management;Other (comment);Patient/family education;Therapeutic exercise    PT Next Visit Plan  Continue with debridement of area; please give cervical ROM exercises    Consulted and Agree with Plan of Care  Patient       Patient will benefit from skilled therapeutic intervention in order to improve the following deficits and impairments:  Decreased skin integrity, Decreased range of motion  Visit Diagnosis: Open neck wound, subsequent encounter  Stiffness of neck    Problem List Patient Active Problem List   Diagnosis Date Noted  . Carotid artery disease ([place]) 12/01/2016  . CAD- CABG 6/10, low risk Myoview 8/13   . Hypothyroidism   . RBBB   . Chronic anticoagulation   . Chronic renal insufficiency, stage III (moderate)   . PAF (paroxysmal atrial fibrillation) ([place])   . Dyslipidemia   . Peripheral vascular disease ([place]) 08/01/2010  . Hypertension 12/26/2009    Scott [name], PT CLT [phone] 03/02/2019, 2:43 PM  [facility] [address] [place], [place], [address] Phone: [phone]   Fax:  [phone]  Name: AYINDE SWIM MRN: [id number] Date of Birth: June 06, 1929

## 2019-03-06 ENCOUNTER — Encounter (HOSPITAL_COMMUNITY): Payer: Self-pay | Admitting: Physical Therapy

## 2019-03-06 ENCOUNTER — Ambulatory Visit (HOSPITAL_COMMUNITY): Payer: Medicare Other | Admitting: Physical Therapy

## 2019-03-06 ENCOUNTER — Other Ambulatory Visit: Payer: Self-pay

## 2019-03-06 DIAGNOSIS — M436 Torticollis: Secondary | ICD-10-CM | POA: Diagnosis not present

## 2019-03-06 DIAGNOSIS — S1190XD Unspecified open wound of unspecified part of neck, subsequent encounter: Secondary | ICD-10-CM | POA: Diagnosis not present

## 2019-03-06 NOTE — Therapy (Signed)
[facility] [place], [place], [address] Phone: [phone]   Fax:  [phone]  Wound Care Therapy  Patient Details  Name: Scott [name] MRN: [id number] Date of Birth: [birth year] Referring Provider (PT): Collene Mares   Encounter Date: 03/06/2019  PT End of Session - 03/06/19 1400    Visit Number  2    Number of Visits  8    Date for PT Re-Evaluation  04/01/19    Authorization Type  Medicare + BCBS    Authorization Time Period  1/22 thru 04/01/2019    Authorization - Visit Number  2    Authorization - Number of Visits  10    PT Start Time  1320    PT Stop Time  1354    PT Time Calculation (min)  34 min    Activity Tolerance  Patient tolerated treatment well    Behavior During Therapy  [facility] for tasks assessed/performed       Past Medical History:  Diagnosis Date  . Chronic anticoagulation   . Chronic renal insufficiency, stage III (moderate)   . Coronary artery disease 2010   CABG  . Dyslipidemia   . Hypertension 12/26/09   renal doppler  . Hypothyroidism   . PAF (paroxysmal atrial fibrillation) ([place])   . Peripheral vascular disease ([place]) 08/01/10   Carotid doppler: 12/26/09 lower arterial doppler  . RBBB   . Shingles April 2012    Past Surgical History:  Procedure Laterality Date  . CARDIAC CATHETERIZATION  6/10  . CORONARY ARTERY BYPASS GRAFT  2010   3-vessel by-pass  . HERNIA REPAIR  3/09  . POPLITEAL ARTERY ANGIOPLASTY Right   . PROSTATE SURGERY  11/99  . RENAL ARTERY ANGIOPLASTY Left 9/11    There were no vitals filed for this visit.              Wound Therapy - 03/06/19 1353    Subjective  PT states no pain.  He has not changed his bandage since he left here on Friday.     Patient and Family Stated Goals  wound to heal     Date of Onset  02/09/19    Prior Treatments  see above, wife is changing dressings daily     Pain Scale  0-10    Pain Score  0-No pain    Evaluation and  Treatment Procedures Explained to Patient/Family  Yes    Evaluation and Treatment Procedures  agreed to    Wound Properties Date First Assessed: 03/02/19 Time First Assessed: 0925 Wound Type: Other (Comment) Location: Cervical Location Orientation: Posterior Present on Admission: Yes   Dressing Type  Gauze (Comment)    Dressing Changed  Changed    Dressing Status  New drainage    Dressing Change Frequency  PRN    Site / Wound Assessment  Granulation tissue;Other (Comment)    % Wound base Red or Granulating  --   white.    % Wound base Yellow/Fibrinous Exudate  75%    % Wound base Other/Granulation Tissue (Comment)  25%    Peri-wound Assessment  Intact    Margins  Unattached edges (unapproximated)    Closure  None    Drainage Amount  Minimal    Drainage Description  Sanguineous    Treatment  Cleansed;Debridement (Selective)    Selective Debridement - Location  --   eschar   Selective Debridement - Tools Used  Scalpel    Selective Debridement - Tissue  Removed  --   scored area as it is very adherent, followed by medihoney    Wound Therapy - Clinical Statement  Pt wound continues to be hypergranulated.  Therapist cut and scored foam to place ontop of dressing to attempt to decrease hypergranulation to allow healing.  If wound continues to have hypergranulation it may be beneficial to have a dermatologist assess pt.     Wound Therapy - Functional Problem List  Decreased cervical ROM due to pt hesitation to move cervical area.     Factors Delaying/Impairing Wound Healing  Polypharmacy    Hydrotherapy Plan  Debridement;Dressing change;Patient/family education    Wound Therapy - Frequency  2X / week   for four weeks    Wound Therapy - Current Recommendations  PT    Wound Plan  Continue to see pt twice a week until eschar is gone at that time may decrease to once a week.     Dressing   medihoney f/b 2x2, AB pad secured with pressure from medipore tape.                 PT Short  Term Goals - 03/06/19 1403      PT SHORT TERM GOAL #1   Title  PT wound to be 80% granulated    Baseline  25    Time  2    Period  Weeks    Status  On-going    Target Date  03/16/19      PT SHORT TERM GOAL #2   Title  Wound size to have decreased 1 cm in width and length.    Time  2    Period  Weeks    Status  On-going        PT Long Term Goals - 03/06/19 1403      PT LONG TERM GOAL #1   Title  Wound to be 100% granulated with scant drainage    Time  4    Period  Weeks    Status  On-going      PT LONG TERM GOAL #2   Title  wound to be no greater than .5x.5 to allow pt to complete self care by using a bandaid at home.    Time  4    Period  Weeks    Status  On-going            Plan - 03/06/19 1401    Clinical Impression Statement  See above; began Cervical ROM to maintain range while wound is healing; manual massage to edges of wound to decrease scar tissue and promote circulation.    Personal Factors and Comorbidities  Age;Comorbidity 2    Comorbidities  HTN, CAD, PVD,    Examination-Activity Limitations  Bathing;Dressing    Stability/Clinical Decision Making  Stable/Uncomplicated    Rehab Potential  Good    PT Frequency  2x / week    PT Duration  4 weeks    PT Treatment/Interventions  ADLs/Self Care Home Management;Other (comment);Patient/family education;Therapeutic exercise    PT Next Visit Plan  Continue with debridement of area; please give cervical ROM exercises    Consulted and Agree with Plan of Care  Patient       Patient will benefit from skilled therapeutic intervention in order to improve the following deficits and impairments:  Decreased skin integrity, Decreased range of motion  Visit Diagnosis: Open neck wound, subsequent encounter  Stiffness of neck     Problem List Patient Active Problem List  Diagnosis Date Noted  . Carotid artery disease (HCC) 12/01/2016  . CAD- CABG 6/10, low risk Myoview 8/13   . Hypothyroidism   . RBBB   .  Chronic anticoagulation   . Chronic renal insufficiency, stage III (moderate)   . PAF (paroxysmal atrial fibrillation) (HCC)   . Dyslipidemia   . Peripheral vascular disease (HCC) 08/01/2010  . Hypertension 12/26/2009    Virgina Organ, PT CLT [phone] 03/06/2019, 2:03 PM  [facility] [address] [place], [place], [address] Phone: [phone]   Fax:  [phone]  Name: Scott [name] MRN: [id number] Date of Birth: May 12, 1929

## 2019-03-08 ENCOUNTER — Other Ambulatory Visit: Payer: Self-pay

## 2019-03-08 ENCOUNTER — Ambulatory Visit (HOSPITAL_COMMUNITY): Payer: Medicare Other | Admitting: Physical Therapy

## 2019-03-08 DIAGNOSIS — M436 Torticollis: Secondary | ICD-10-CM | POA: Diagnosis not present

## 2019-03-08 DIAGNOSIS — S1190XD Unspecified open wound of unspecified part of neck, subsequent encounter: Secondary | ICD-10-CM | POA: Diagnosis not present

## 2019-03-08 NOTE — Therapy (Signed)
[place], [place], [address] Phone: [phone]   Fax:  [phone]  Wound Care Therapy  Patient Details  Name: Scott [name] MRN: [id number] Date of Birth: 1929-04-11 Referring Provider (PT): Collene Mares   Encounter Date: 03/08/2019  PT End of Session - 03/08/19 1534    Visit Number  3    Number of Visits  8    Date for PT Re-Evaluation  04/01/19    Authorization Type  Medicare + BCBS    Authorization Time Period  1/22 thru 04/01/2019    Authorization - Visit Number  3    Authorization - Number of Visits  10    PT Start Time  6389    PT Stop Time  1350    PT Time Calculation (min)  28 min    Activity Tolerance  Patient tolerated treatment well    Behavior During Therapy  [facility] for tasks assessed/performed       Past Medical History:  Diagnosis Date  . Chronic anticoagulation   . Chronic renal insufficiency, stage III (moderate)   . Coronary artery disease 2010   CABG  . Dyslipidemia   . Hypertension 12/26/09   renal doppler  . Hypothyroidism   . PAF (paroxysmal atrial fibrillation) ([place])   . Peripheral vascular disease ([place]) 08/01/10   Carotid doppler: 12/26/09 lower arterial doppler  . RBBB   . Shingles April 2012    Past Surgical History:  Procedure Laterality Date  . CARDIAC CATHETERIZATION  6/10  . CORONARY ARTERY BYPASS GRAFT  2010   3-vessel by-pass  . HERNIA REPAIR  3/09  . POPLITEAL ARTERY ANGIOPLASTY Right   . PROSTATE SURGERY  11/99  . RENAL ARTERY ANGIOPLASTY Left 9/11    There were no vitals filed for this visit.              Wound Therapy - 03/08/19 1530    Subjective  pt returns today stating it is more itchy now and really not hurting.    Patient and Family Stated Goals  wound to heal     Date of Onset  02/09/19    Prior Treatments  see above, wife is changing dressings daily     Evaluation and Treatment Procedures Explained to Patient/Family  Yes    Evaluation and Treatment Procedures  agreed to    Wound Properties Date First Assessed: 03/02/19 Time First Assessed: 0925 Wound Type: Other (Comment) Location: Cervical Location Orientation: Posterior Present on Admission: Yes   Dressing Type  Gauze (Comment)    Dressing Changed  Changed    Dressing Status  New drainage    Dressing Change Frequency  PRN    Site / Wound Assessment  Granulation tissue;Other (Comment)    % Wound base Red or Granulating  --   white.    % Wound base Yellow/Fibrinous Exudate  75%    % Wound base Other/Granulation Tissue (Comment)  25%    Peri-wound Assessment  Intact    Wound Length (cm)  2.8 cm    Wound Width (cm)  2.6 cm    Wound Depth (cm)  0 cm    Wound Volume (cm^3)  0 cm^3    Wound Surface Area (cm^2)  7.28 cm^2    Margins  Unattached edges (unapproximated)    Closure  None    Drainage Amount  Minimal    Drainage Description  Sanguineous    Treatment  Cleansed;Debridement (Selective)  Selective Debridement - Location  --   eschar   Selective Debridement - Tools Used  Scalpel    Selective Debridement - Tissue Removed  --   scored area as it is very adherent, followed by medihoney    Wound Therapy - Clinical Statement  no hypergranulation present today.  Able to remove some central slough with forceps and scissors this session, however remains adherent.  Pt  Remeasured with overall no change in size.  continued with medihoney following good cleansing.      Wound Therapy - Functional Problem List  Decreased cervical ROM due to pt hesitation to move cervical area.     Factors Delaying/Impairing Wound Healing  Polypharmacy    Hydrotherapy Plan  Debridement;Dressing change;Patient/family education    Wound Therapy - Frequency  2X / week   for four weeks    Wound Therapy - Current Recommendations  PT    Wound Plan  conitnue with appropriate dressings to promote healing.     Dressing   medihoney f/b 2x2, AB pad secured with pressure from medipore tape.                  PT Short Term Goals - 03/06/19 1403      PT SHORT TERM GOAL #1   Title  PT wound to be 80% granulated    Baseline  25    Time  2    Period  Weeks    Status  On-going    Target Date  03/16/19      PT SHORT TERM GOAL #2   Title  Wound size to have decreased 1 cm in width and length.    Time  2    Period  Weeks    Status  On-going        PT Long Term Goals - 03/06/19 1403      PT LONG TERM GOAL #1   Title  Wound to be 100% granulated with scant drainage    Time  4    Period  Weeks    Status  On-going      PT LONG TERM GOAL #2   Title  wound to be no greater than .5x.5 to allow pt to complete self care by using a bandaid at home.    Time  4    Period  Weeks    Status  On-going              Patient will benefit from skilled therapeutic intervention in order to improve the following deficits and impairments:     Visit Diagnosis: Stiffness of neck  Open neck wound, subsequent encounter     Problem List Patient Active Problem List   Diagnosis Date Noted  . Carotid artery disease (HCC) 12/01/2016  . CAD- CABG 6/10, low risk Myoview 8/13   . Hypothyroidism   . RBBB   . Chronic anticoagulation   . Chronic renal insufficiency, stage III (moderate)   . PAF (paroxysmal atrial fibrillation) (HCC)   . Dyslipidemia   . Peripheral vascular disease (HCC) 08/01/2010  . Hypertension 12/26/2009   Lurena Nida, PTA/CLT [phone]  Lurena Nida 03/08/2019, 3:35 PM  [facility] [address] [place], [place], [address] Phone: [phone]   Fax:  [phone]  Name: Scott [name] MRN: [id number] Date of Birth: 08-02-1929

## 2019-03-11 DIAGNOSIS — E063 Autoimmune thyroiditis: Secondary | ICD-10-CM | POA: Diagnosis not present

## 2019-03-11 DIAGNOSIS — N1832 Chronic kidney disease, stage 3b: Secondary | ICD-10-CM | POA: Diagnosis not present

## 2019-03-11 DIAGNOSIS — I129 Hypertensive chronic kidney disease with stage 1 through stage 4 chronic kidney disease, or unspecified chronic kidney disease: Secondary | ICD-10-CM | POA: Diagnosis not present

## 2019-03-11 DIAGNOSIS — E1122 Type 2 diabetes mellitus with diabetic chronic kidney disease: Secondary | ICD-10-CM | POA: Diagnosis not present

## 2019-03-13 ENCOUNTER — Ambulatory Visit (HOSPITAL_COMMUNITY): Payer: Medicare Other | Attending: Physician Assistant | Admitting: Physical Therapy

## 2019-03-13 ENCOUNTER — Other Ambulatory Visit: Payer: Self-pay

## 2019-03-13 DIAGNOSIS — S1190XD Unspecified open wound of unspecified part of neck, subsequent encounter: Secondary | ICD-10-CM

## 2019-03-13 DIAGNOSIS — M436 Torticollis: Secondary | ICD-10-CM | POA: Diagnosis not present

## 2019-03-13 NOTE — Therapy (Signed)
[facility] [address] [place], [place], [address] Phone: [phone]   Fax:  [phone]  Wound Care Therapy  Patient Details  Name: Scott [name] MRN: [id number] Date of Birth: 1929-03-14 Referring Provider (PT): Lenise Herald   Encounter Date: 03/13/2019  PT End of Session - 03/13/19 1433    Visit Number  4    Number of Visits  8    Date for PT Re-Evaluation  04/01/19    Authorization Type  Medicare + BCBS    Authorization Time Period  1/22 thru 04/01/2019    Authorization - Visit Number  4    Authorization - Number of Visits  10    PT Start Time  1320    PT Stop Time  1345    PT Time Calculation (min)  25 min    Activity Tolerance  Patient tolerated treatment well    Behavior During Therapy  [facility] for tasks assessed/performed       Past Medical History:  Diagnosis Date  . Chronic anticoagulation   . Chronic renal insufficiency, stage III (moderate)   . Coronary artery disease 2010   CABG  . Dyslipidemia   . Hypertension 12/26/09   renal doppler  . Hypothyroidism   . PAF (paroxysmal atrial fibrillation) (HCC)   . Peripheral vascular disease (HCC) 08/01/10   Carotid doppler: 12/26/09 lower arterial doppler  . RBBB   . Shingles April 2012    Past Surgical History:  Procedure Laterality Date  . CARDIAC CATHETERIZATION  6/10  . CORONARY ARTERY BYPASS GRAFT  2010   3-vessel by-pass  . HERNIA REPAIR  3/09  . POPLITEAL ARTERY ANGIOPLASTY Right   . PROSTATE SURGERY  11/99  . RENAL ARTERY ANGIOPLASTY Left 9/11    There were no vitals filed for this visit.              Wound Therapy - 03/13/19 1428    Subjective  pt returns today stating it is more itchy now and really not hurting.    Patient and Family Stated Goals  wound to heal     Date of Onset  02/09/19    Prior Treatments  see above, wife is changing dressings daily     Evaluation and Treatment Procedures Explained to Patient/Family  Yes    Evaluation and Treatment Procedures  agreed to    Wound Properties Date First Assessed: 03/02/19 Time First Assessed: 0925 Wound Type: Other (Comment) Location: Cervical Location Orientation: Posterior Present on Admission: Yes   Dressing Type  Gauze (Comment)    Dressing Changed  Changed    Dressing Status  New drainage    Dressing Change Frequency  PRN    Site / Wound Assessment  Granulation tissue;Other (Comment)    % Wound base Red or Granulating  --   white.    % Wound base Yellow/Fibrinous Exudate  75%    % Wound base Other/Granulation Tissue (Comment)  25%    Peri-wound Assessment  Intact    Wound Length (cm)  2.5 cm    Wound Width (cm)  2.5 cm    Wound Depth (cm)  0 cm    Wound Volume (cm^3)  0 cm^3    Wound Surface Area (cm^2)  6.25 cm^2    Margins  Unattached edges (unapproximated)    Closure  None    Drainage Amount  Minimal    Drainage Description  Sanguineous    Treatment  Cleansed;Debridement (Selective)  Selective Debridement - Location  posterior neck   eschar   Selective Debridement - Tools Used  Scalpel;Forceps    Selective Debridement - Tissue Removed  slough and devitalized tissue   scored area as it is very adherent, followed by medihoney    Wound Therapy - Clinical Statement  Wound remeasured this session with slight reduction noted.  Central remains mainly slough and unsure if there is depth beneath.  Slightly supeiror to Lt of wound is one small area of hypergranulation.  Overall is improving with noted approximation.  Continued with medihoney hydrogel.  folded 2, 2X2 both ways and applied medipore tape on stretch to apply compression to wound to reduce hypergranulation.    Wound Therapy - Functional Problem List  Decreased cervical ROM due to pt hesitation to move cervical area.     Factors Delaying/Impairing Wound Healing  Polypharmacy    Hydrotherapy Plan  Debridement;Dressing change;Patient/family education    Wound Therapy - Frequency  2X / week   for  four weeks    Wound Therapy - Current Recommendations  PT    Wound Plan  conitnue with appropriate dressings to promote healing.     Dressing   medihoney f/b 2x2, AB pad secured with pressure from medipore tape.                 PT Short Term Goals - 03/06/19 1403      PT SHORT TERM GOAL #1   Title  PT wound to be 80% granulated    Baseline  25    Time  2    Period  Weeks    Status  On-going    Target Date  03/16/19      PT SHORT TERM GOAL #2   Title  Wound size to have decreased 1 cm in width and length.    Time  2    Period  Weeks    Status  On-going        PT Long Term Goals - 03/06/19 1403      PT LONG TERM GOAL #1   Title  Wound to be 100% granulated with scant drainage    Time  4    Period  Weeks    Status  On-going      PT LONG TERM GOAL #2   Title  wound to be no greater than .5x.5 to allow pt to complete self care by using a bandaid at home.    Time  4    Period  Weeks    Status  On-going              Patient will benefit from skilled therapeutic intervention in order to improve the following deficits and impairments:     Visit Diagnosis: Open neck wound, subsequent encounter  Stiffness of neck     Problem List Patient Active Problem List   Diagnosis Date Noted  . Carotid artery disease ([place]) 12/01/2016  . CAD- CABG 6/10, low risk Myoview 8/13   . Hypothyroidism   . RBBB   . Chronic anticoagulation   . Chronic renal insufficiency, stage III (moderate)   . PAF (paroxysmal atrial fibrillation) ([place])   . Dyslipidemia   . Peripheral vascular disease ([place]) 08/01/2010  . Hypertension 12/26/2009   Teena Irani, PTA/CLT [phone]  Teena Irani 03/13/2019, 2:34 PM  [facility] [address] [place], [place], [address] Phone: [phone]   Fax:  [phone]  Name: Scott [name]  [place] MRN: [id number] Date of Birth: 1929-07-08

## 2019-03-15 ENCOUNTER — Encounter (HOSPITAL_COMMUNITY): Payer: Self-pay | Admitting: Physical Therapy

## 2019-03-15 ENCOUNTER — Other Ambulatory Visit: Payer: Self-pay

## 2019-03-15 ENCOUNTER — Ambulatory Visit (HOSPITAL_COMMUNITY): Payer: Medicare Other | Admitting: Physical Therapy

## 2019-03-15 ENCOUNTER — Other Ambulatory Visit: Payer: Self-pay | Admitting: Cardiovascular Disease

## 2019-03-15 DIAGNOSIS — M436 Torticollis: Secondary | ICD-10-CM

## 2019-03-15 DIAGNOSIS — S1190XD Unspecified open wound of unspecified part of neck, subsequent encounter: Secondary | ICD-10-CM | POA: Diagnosis not present

## 2019-03-15 NOTE — Therapy (Signed)
[place], [place], [address] Phone: [phone]   Fax:  [phone]  Wound Care Therapy  Patient Details  Name: Scott [name] MRN: [id number] Date of Birth: 02/27/1929 Referring Provider (PT): Collene Mares   Encounter Date: 03/15/2019  PT End of Session - 03/15/19 1355    Visit Number  6    Number of Visits  8    Date for PT Re-Evaluation  04/01/19    Authorization Type  Medicare + BCBS    Authorization Time Period  1/22 thru 04/01/2019    Authorization - Visit Number  6    Authorization - Number of Visits  10    PT Start Time  9030    PT Stop Time  1539    PT Time Calculation (min)  24 min    Activity Tolerance  Patient tolerated treatment well    Behavior During Therapy  [facility] for tasks assessed/performed       Past Medical History:  Diagnosis Date  . Chronic anticoagulation   . Chronic renal insufficiency, stage III (moderate)   . Coronary artery disease 2010   CABG  . Dyslipidemia   . Hypertension 12/26/09   renal doppler  . Hypothyroidism   . PAF (paroxysmal atrial fibrillation) ([place])   . Peripheral vascular disease ([place]) 08/01/10   Carotid doppler: 12/26/09 lower arterial doppler  . RBBB   . Shingles April 2012    Past Surgical History:  Procedure Laterality Date  . CARDIAC CATHETERIZATION  6/10  . CORONARY ARTERY BYPASS GRAFT  2010   3-vessel by-pass  . HERNIA REPAIR  3/09  . POPLITEAL ARTERY ANGIOPLASTY Right   . PROSTATE SURGERY  11/99  . RENAL ARTERY ANGIOPLASTY Left 9/11    There were no vitals filed for this visit.              Wound Therapy - 03/15/19 1347    Subjective  PT has no complaints states that he does not mess with the wound at all.  Pt has not been completing his cervical exercises.     Patient and Family Stated Goals  wound to heal     Date of Onset  02/09/19    Prior Treatments  see above, wife is changing dressings daily     Evaluation and Treatment  Procedures Explained to Patient/Family  Yes    Evaluation and Treatment Procedures  agreed to    Wound Properties Date First Assessed: 03/02/19 Time First Assessed: 0925 Wound Type: Other (Comment) Location: Cervical Location Orientation: Posterior Present on Admission: Yes   Dressing Type  Gauze (Comment)    Dressing Changed  Changed    Dressing Status  New drainage    Dressing Change Frequency  PRN    Site / Wound Assessment  Granulation tissue;Other (Comment)    % Wound base Red or Granulating  20%    % Wound base Yellow/Fibrinous Exudate  80%    % Wound base Other/Granulation Tissue (Comment)  0%    Peri-wound Assessment  Intact    Wound Length (cm)  2.2 cm    Wound Width (cm)  1.8 cm    Wound Surface Area (cm^2)  3.96 cm^2    Margins  Unattached edges (unapproximated)    Closure  None    Drainage Amount  Minimal    Drainage Description  Sanguineous    Treatment  Cleansed;Debridement (Selective)    Selective Debridement - Location  posterior neck  eschar   Selective Debridement - Tools Used  Scalpel    Selective Debridement - Tissue Removed  slough and devitalized tissue   scored area as it is very adherent, followed by medihoney    Wound Therapy - Clinical Statement  Wound continues approximate, however area that continues to be open tends to have significant amount of slough on them.  PT wound base is slightly hypergranulated therefore therapist placed 1/4" foam over wound prior to dressing.  Wound is very fabrile bleeding easily with the slightest touch.   Tissue surrounding wound is very tight therefore manual completed to allow improved circulation to area.    Wound Therapy - Functional Problem List  Decreased cervical ROM due to pt hesitation to move cervical area.     Factors Delaying/Impairing Wound Healing  Polypharmacy    Hydrotherapy Plan  Debridement;Dressing change;Patient/family education    Wound Therapy - Frequency  2X / week   for four weeks    Wound Therapy -  Current Recommendations  PT    Wound Plan  conitnue with appropriate dressings to promote healing.     Dressing   medihoney f/b 2x2, AB pad secured with pressure from medipore tape.                 PT Short Term Goals - 03/06/19 1403      PT SHORT TERM GOAL #1   Title  PT wound to be 80% granulated    Baseline  25    Time  2    Period  Weeks    Status  On-going    Target Date  03/16/19      PT SHORT TERM GOAL #2   Title  Wound size to have decreased 1 cm in width and length.    Time  2    Period  Weeks    Status  On-going        PT Long Term Goals - 03/06/19 1403      PT LONG TERM GOAL #1   Title  Wound to be 100% granulated with scant drainage    Time  4    Period  Weeks    Status  On-going      PT LONG TERM GOAL #2   Title  wound to be no greater than .5x.5 to allow pt to complete self care by using a bandaid at home.    Time  4    Period  Weeks    Status  On-going              Patient will benefit from skilled therapeutic intervention in order to improve the following deficits and impairments:     Visit Diagnosis: Open neck wound, subsequent encounter  Stiffness of neck     Problem List Patient Active Problem List   Diagnosis Date Noted  . Carotid artery disease (HCC) 12/01/2016  . CAD- CABG 6/10, low risk Myoview 8/13   . Hypothyroidism   . RBBB   . Chronic anticoagulation   . Chronic renal insufficiency, stage III (moderate)   . PAF (paroxysmal atrial fibrillation) (HCC)   . Dyslipidemia   . Peripheral vascular disease (HCC) 08/01/2010  . Hypertension 12/26/2009    Virgina Organ, PT CLT [phone] 03/15/2019, 1:56 PM  [facility] [address] [place], [place], [address] Phone: [phone]   Fax:  [phone]  Name: Scott [name] MRN: [id number] Date of Birth: 1929-11-29

## 2019-03-20 ENCOUNTER — Ambulatory Visit (HOSPITAL_COMMUNITY): Payer: Medicare Other | Admitting: Physical Therapy

## 2019-03-20 ENCOUNTER — Other Ambulatory Visit: Payer: Self-pay

## 2019-03-20 DIAGNOSIS — S1190XD Unspecified open wound of unspecified part of neck, subsequent encounter: Secondary | ICD-10-CM | POA: Diagnosis not present

## 2019-03-20 DIAGNOSIS — M436 Torticollis: Secondary | ICD-10-CM | POA: Diagnosis not present

## 2019-03-20 NOTE — Therapy (Signed)
[place], [place], [address] Phone: [phone]   Fax:  [phone]  Wound Care Therapy  Patient Details  Name: Scott [name] MRN: [id number] Date of Birth: September 03, 1929 Referring Provider (PT): Scott [name]   Encounter Date: 03/20/2019  PT End of Session - 03/20/19 1553    Visit Number  7    Number of Visits  8    Date for PT Re-Evaluation  04/01/19    Authorization Type  Medicare + BCBS    Authorization Time Period  1/22 thru 04/01/2019    Authorization - Visit Number  7    Authorization - Number of Visits  10    PT Start Time  1320    PT Stop Time  1335    PT Time Calculation (min)  15 min    Activity Tolerance  Patient tolerated treatment well    Behavior During Therapy  [name] for tasks assessed/performed       Past Medical History:  Diagnosis Date  . Chronic anticoagulation   . Chronic renal insufficiency, stage III (moderate)   . Coronary artery disease 2010   CABG  . Dyslipidemia   . Hypertension 12/26/09   renal doppler  . Hypothyroidism   . PAF (paroxysmal atrial fibrillation) ([place])   . Peripheral vascular disease ([place]) 08/01/10   Carotid doppler: 12/26/09 lower arterial doppler  . RBBB   . Shingles April 2012    Past Surgical History:  Procedure Laterality Date  . CARDIAC CATHETERIZATION  6/10  . CORONARY ARTERY BYPASS GRAFT  2010   3-vessel by-pass  . HERNIA REPAIR  3/09  . POPLITEAL ARTERY ANGIOPLASTY Right   . PROSTATE SURGERY  11/99  . RENAL ARTERY ANGIOPLASTY Left 9/11    There were no vitals filed for this visit.              Wound Therapy - 03/20/19 1547    Subjective  PT has no complaints states that he does not mess with the wound at all.  Pt has not been completing his cervical exercises.     Patient and Family Stated Goals  wound to heal     Date of Onset  02/09/19    Prior Treatments  see above, wife is changing dressings daily     Evaluation and Treatment  Procedures Explained to Patient/Family  Yes    Evaluation and Treatment Procedures  agreed to    Wound Properties Date First Assessed: 03/02/19 Time First Assessed: 0925 Wound Type: Other (Comment) Location: Cervical Location Orientation: Posterior Present on Admission: Yes   Dressing Type  Gauze (Comment)    Dressing Status  New drainage    Dressing Change Frequency  PRN    Site / Wound Assessment  Granulation tissue;Other (Comment)    % Wound base Red or Granulating  20%    % Wound base Yellow/Fibrinous Exudate  80%    % Wound base Other/Granulation Tissue (Comment)  0%    Peri-wound Assessment  Intact    Wound Length (cm)  2.2 cm    Wound Width (cm)  1.5 cm    Wound Depth (cm)  0 cm    Wound Volume (cm^3)  0 cm^3    Wound Surface Area (cm^2)  3.3 cm^2    Margins  Unattached edges (unapproximated)    Closure  None    Drainage Amount  Minimal    Drainage Description  Sanguineous    Treatment  Cleansed;Debridement (Selective)  Selective Debridement - Location  posterior neck   eschar   Selective Debridement - Tools Used  Scalpel    Selective Debridement - Tissue Removed  slough and devitalized tissue   scored area as it is very adherent, followed by medihoney    Wound Therapy - Clinical Statement  wound remeasured this session with reduction in width, no change in legnth.  Continues to have thick slough  central wounds and bleeds easily with debridement.  Used saline soaked gauze this session to attempt removal of slough from woundbed.      Wound Therapy - Functional Problem List  Decreased cervical ROM due to pt hesitation to move cervical area.     Factors Delaying/Impairing Wound Healing  Polypharmacy    Hydrotherapy Plan  Debridement;Dressing change;Patient/family education    Wound Therapy - Frequency  2X / week   for four weeks    Wound Therapy - Current Recommendations  PT    Wound Plan  conitnue with appropriate dressings to promote healing.     Dressing   saline 2X2 f/b  dry 2x2, 1/2" foam pad secured with pressure from medipore tape.                 PT Short Term Goals - 03/06/19 1403      PT SHORT TERM GOAL #1   Title  PT wound to be 80% granulated    Baseline  25    Time  2    Period  Weeks    Status  On-going    Target Date  03/16/19      PT SHORT TERM GOAL #2   Title  Wound size to have decreased 1 cm in width and length.    Time  2    Period  Weeks    Status  On-going        PT Long Term Goals - 03/06/19 1403      PT LONG TERM GOAL #1   Title  Wound to be 100% granulated with scant drainage    Time  4    Period  Weeks    Status  On-going      PT LONG TERM GOAL #2   Title  wound to be no greater than .5x.5 to allow pt to complete self care by using a bandaid at home.    Time  4    Period  Weeks    Status  On-going              Patient will benefit from skilled therapeutic intervention in order to improve the following deficits and impairments:     Visit Diagnosis: Stiffness of neck  Open neck wound, subsequent encounter     Problem List Patient Active Problem List   Diagnosis Date Noted  . Carotid artery disease (HCC) 12/01/2016  . CAD- CABG 6/10, low risk Myoview 8/13   . Hypothyroidism   . RBBB   . Chronic anticoagulation   . Chronic renal insufficiency, stage III (moderate)   . PAF (paroxysmal atrial fibrillation) (HCC)   . Dyslipidemia   . Peripheral vascular disease (HCC) 08/01/2010  . Hypertension 12/26/2009   Lurena Nida, PTA/CLT [phone]  Lurena Nida 03/20/2019, 3:54 PM  [place] [address] [place], [place], [address] Phone: [phone]   Fax:  [phone]  Name: Scott [name] MRN: [id number] Date of Birth: Aug 15, 1929

## 2019-03-22 ENCOUNTER — Encounter (HOSPITAL_COMMUNITY): Payer: Self-pay | Admitting: Physical Therapy

## 2019-03-22 ENCOUNTER — Other Ambulatory Visit: Payer: Self-pay

## 2019-03-22 ENCOUNTER — Ambulatory Visit (HOSPITAL_COMMUNITY): Payer: Medicare Other | Admitting: Physical Therapy

## 2019-03-22 DIAGNOSIS — M436 Torticollis: Secondary | ICD-10-CM | POA: Diagnosis not present

## 2019-03-22 DIAGNOSIS — S1190XD Unspecified open wound of unspecified part of neck, subsequent encounter: Secondary | ICD-10-CM

## 2019-03-22 NOTE — Therapy (Signed)
[place], [place], [address] Phone: [phone]   Fax:  [phone]  Wound Care Therapy  Patient Details  Name: Scott [name] MRN: [id number] Date of Birth: 06/15/1929 Referring Provider (PT): Collene Mares   Encounter Date: 03/22/2019  PT End of Session - 03/22/19 1402    Visit Number  8    Number of Visits  14    Date for PT Re-Evaluation  04/12/19    Authorization Type  Medicare + BCBS    Authorization Time Period  1/22 thru 04/01/2019    Authorization - Visit Number  8    Authorization - Number of Visits  14    PT Start Time  6440    PT Stop Time  1350    PT Time Calculation (min)  35 min    Activity Tolerance  Patient tolerated treatment well    Behavior During Therapy  [facility] for tasks assessed/performed       Past Medical History:  Diagnosis Date  . Chronic anticoagulation   . Chronic renal insufficiency, stage III (moderate)   . Coronary artery disease 2010   CABG  . Dyslipidemia   . Hypertension 12/26/09   renal doppler  . Hypothyroidism   . PAF (paroxysmal atrial fibrillation) ([place])   . Peripheral vascular disease ([place]) 08/01/10   Carotid doppler: 12/26/09 lower arterial doppler  . RBBB   . Shingles April 2012    Past Surgical History:  Procedure Laterality Date  . CARDIAC CATHETERIZATION  6/10  . CORONARY ARTERY BYPASS GRAFT  2010   3-vessel by-pass  . HERNIA REPAIR  3/09  . POPLITEAL ARTERY ANGIOPLASTY Right   . PROSTATE SURGERY  11/99  . RENAL ARTERY ANGIOPLASTY Left 9/11    There were no vitals filed for this visit.              Wound Therapy - 03/22/19 1349    Subjective  Pt states that his neck is itching     Patient and Family Stated Goals  wound to heal     Date of Onset  02/09/19    Prior Treatments  see above, wife is changing dressings daily     Evaluation and Treatment Procedures Explained to Patient/Family  Yes    Evaluation and Treatment Procedures   agreed to    Wound Properties Date First Assessed: 03/02/19 Time First Assessed: 0925 Wound Type: Other (Comment) Location: Cervical Location Orientation: Posterior Present on Admission: Yes   Dressing Type  Gauze (Comment)    Dressing Status  New drainage    Dressing Change Frequency  PRN    Site / Wound Assessment  Granulation tissue;Other (Comment)    % Wound base Red or Granulating  40%    % Wound base Yellow/Fibrinous Exudate  60%   was 75%   % Wound base Other/Granulation Tissue (Comment)  0%   was 25%   Peri-wound Assessment  Intact    Wound Length (cm)  2 cm   was 2.8 cm    Wound Width (cm)  1 cm   was 2.6 cm   Wound Surface Area (cm^2)  2 cm^2    Margins  Unattached edges (unapproximated)    Closure  None    Drainage Amount  Minimal    Drainage Description  Purulent;No odor    Treatment  Cleansed;Debridement (Selective)    Selective Debridement - Location  posterior neck   eschar   Selective Debridement - Tools  Used  Forceps;Scalpel    Selective Debridement - Tissue Removed  slough and devitalized tissue   scored area as it is very adherent, followed by medihoney    Wound Therapy - Clinical Statement  Wound remeasured and continues to approximate with increased granulation.noted dark discharge but no signs of infection.      Wound Therapy - Functional Problem List  Decreased cervical ROM due to pt hesitation to move cervical area.     Factors Delaying/Impairing Wound Healing  Polypharmacy    Hydrotherapy Plan  Debridement;Dressing change;Patient/family education    Wound Therapy - Frequency  2X / week   for four weeks    Wound Therapy - Current Recommendations  PT    Wound Plan  conitnue with debridement and  appropriate dressings to promote healing.     Dressing   medihoney, 2x2 foam  f/b medipore tape                 PT Short Term Goals - 03/22/19 1409      PT SHORT TERM GOAL #1   Title  PT wound to be 80% granulated    Baseline  25    Time  2     Period  Weeks    Status  On-going    Target Date  03/16/19      PT SHORT TERM GOAL #2   Title  Wound size to have decreased 1 cm in width and length.    Time  2    Period  Weeks    Status  Partially Met        PT Long Term Goals - 03/22/19 1409      PT LONG TERM GOAL #1   Title  Wound to be 100% granulated with scant drainage    Time  4    Period  Weeks    Status  On-going      PT LONG TERM GOAL #2   Title  wound to be no greater than .5x.5 to allow pt to complete self care by using a bandaid at home.    Time  4    Period  Weeks    Status  On-going            Plan - 03/22/19 1405    Clinical Impression Statement  Pt has been seen for 8 visits for a non-healing wound on the posterior aspect on his neck.  The wound has decreased in size but continues to have adherent eschar that needs to be bladed as well as being hypergranulated and needing compression to allow healing.  Due to these issues Mr. Litton will benefit from continued skilled therapy until his wound has healed.    Personal Factors and Comorbidities  Age;Comorbidity 2    Comorbidities  HTN, CAD, PVD,    Examination-Activity Limitations  Bathing;Dressing    Stability/Clinical Decision Making  Stable/Uncomplicated    Rehab Potential  Good    PT Frequency  2x / week    PT Duration  4 weeks   add 3 weeks for a total of seven weeks.   PT Treatment/Interventions  ADLs/Self Care Home Management;Other (comment);Patient/family education;Therapeutic exercise    PT Next Visit Plan  Continue with debridement of area;    Consulted and Agree with Plan of Care  Patient       Patient will benefit from skilled therapeutic intervention in order to improve the following deficits and impairments:  Decreased skin integrity, Decreased range of motion  Visit  Diagnosis: Open neck wound, subsequent encounter  Stiffness of neck     Problem List Patient Active Problem List   Diagnosis Date Noted  . Carotid artery disease  ([place]) 12/01/2016  . CAD- CABG 6/10, low risk Myoview 8/13   . Hypothyroidism   . RBBB   . Chronic anticoagulation   . Chronic renal insufficiency, stage III (moderate)   . PAF (paroxysmal atrial fibrillation) ([place])   . Dyslipidemia   . Peripheral vascular disease ([place]) 08/01/2010  . Hypertension 12/26/2009   Rayetta Humphrey, PT CLT [phone] 03/22/2019, 2:10 PM  [place], [place], [address] Phone: [phone]   Fax:  [phone]  Name: Scott [name] MRN: [id number] Date of Birth: May 05, 1929

## 2019-03-26 ENCOUNTER — Telehealth (HOSPITAL_COMMUNITY): Payer: Self-pay | Admitting: Physical Therapy

## 2019-03-26 NOTE — Telephone Encounter (Signed)
pt called to cancel todays appt due to the weather and power outages

## 2019-03-27 ENCOUNTER — Other Ambulatory Visit: Payer: Self-pay

## 2019-03-27 ENCOUNTER — Ambulatory Visit (HOSPITAL_COMMUNITY): Payer: Medicare Other | Admitting: Physical Therapy

## 2019-03-27 DIAGNOSIS — M436 Torticollis: Secondary | ICD-10-CM

## 2019-03-27 DIAGNOSIS — S1190XD Unspecified open wound of unspecified part of neck, subsequent encounter: Secondary | ICD-10-CM

## 2019-03-27 NOTE — Therapy (Signed)
[place], [place], [address] Phone: [phone]   Fax:  [phone]  Wound Care Therapy  Patient Details  Name: Scott [name] MRN: [id number] Date of Birth: [birth year] Referring Provider (PT): Collene Mares   Encounter Date: 03/27/2019  PT End of Session - 03/27/19 1453    Visit Number  9    Number of Visits  14    Date for PT Re-Evaluation  04/12/19    Authorization Type  Medicare + BCBS    Authorization Time Period  1/22 thru 04/01/2019    Authorization - Visit Number  9    Authorization - Number of Visits  14    PT Start Time  1318    PT Stop Time  1335    PT Time Calculation (min)  17 min    Activity Tolerance  Patient tolerated treatment well    Behavior During Therapy  [facility] for tasks assessed/performed       Past Medical History:  Diagnosis Date  . Chronic anticoagulation   . Chronic renal insufficiency, stage III (moderate)   . Coronary artery disease 2010   CABG  . Dyslipidemia   . Hypertension 12/26/09   renal doppler  . Hypothyroidism   . PAF (paroxysmal atrial fibrillation) ([place])   . Peripheral vascular disease ([place]) 08/01/10   Carotid doppler: 12/26/09 lower arterial doppler  . RBBB   . Shingles April 2012    Past Surgical History:  Procedure Laterality Date  . CARDIAC CATHETERIZATION  6/10  . CORONARY ARTERY BYPASS GRAFT  2010   3-vessel by-pass  . HERNIA REPAIR  3/09  . POPLITEAL ARTERY ANGIOPLASTY Right   . PROSTATE SURGERY  11/99  . RENAL ARTERY ANGIOPLASTY Left 9/11    There were no vitals filed for this visit.              Wound Therapy - 03/27/19 1331    Subjective  Pt states that his neck is itching     Patient and Family Stated Goals  wound to heal     Date of Onset  02/09/19    Prior Treatments  see above, wife is changing dressings daily     Evaluation and Treatment Procedures Explained to Patient/Family  Yes    Evaluation and Treatment Procedures   agreed to    Wound Properties Date First Assessed: 03/02/19 Time First Assessed: 0925 Wound Type: Other (Comment) Location: Cervical Location Orientation: Posterior Present on Admission: Yes   Dressing Type  Gauze (Comment)    Dressing Changed  Changed    Dressing Status  New drainage    Dressing Change Frequency  PRN    Site / Wound Assessment  Granulation tissue;Other (Comment)    % Wound base Red or Granulating  50%    % Wound base Yellow/Fibrinous Exudate  50%   was 75%   % Wound base Other/Granulation Tissue (Comment)  0%   was 25%   Peri-wound Assessment  Intact    Margins  Attached edges (approximated)    Closure  None    Drainage Amount  Minimal    Drainage Description  Serosanguineous    Treatment  Cleansed;Debridement (Selective)    Selective Debridement - Location  posterior neck   eschar   Selective Debridement - Tools Used  Forceps;Scalpel    Selective Debridement - Tissue Removed  slough and devitalized tissue   scored area as it is very adherent, followed by [employer]  Wound Therapy - Clinical Statement  noted improvement today with increased granulation without hypergranulation and approximation of wound borders.  Debrided edges of devitalized tissue and old drainage.  Cleansed well alnd continued with medihoney hydrogel patch and gauze.  Did not use the foam today as no hypergranluation and pt going to get a haircut following  woundcare today.      Wound Therapy - Functional Problem List  Decreased cervical ROM due to pt hesitation to move cervical area.     Factors Delaying/Impairing Wound Healing  Polypharmacy    Hydrotherapy Plan  Debridement;Dressing change;Patient/family education    Wound Therapy - Frequency  2X / week   for four weeks    Wound Therapy - Current Recommendations  PT    Wound Plan  conitnue with debridement and  appropriate dressings to promote healing.     Dressing   medihoney, 2x2 foam  f/b medipore tape                 PT Short  Term Goals - 03/22/19 1409      PT SHORT TERM GOAL #1   Title  PT wound to be 80% granulated    Baseline  25    Time  2    Period  Weeks    Status  On-going    Target Date  03/16/19      PT SHORT TERM GOAL #2   Title  Wound size to have decreased 1 cm in width and length.    Time  2    Period  Weeks    Status  Partially Met        PT Long Term Goals - 03/22/19 1409      PT LONG TERM GOAL #1   Title  Wound to be 100% granulated with scant drainage    Time  4    Period  Weeks    Status  On-going      PT LONG TERM GOAL #2   Title  wound to be no greater than .5x.5 to allow pt to complete self care by using a bandaid at home.    Time  4    Period  Weeks    Status  On-going              Patient will benefit from skilled therapeutic intervention in order to improve the following deficits and impairments:     Visit Diagnosis: Stiffness of neck  Open neck wound, subsequent encounter     Problem List Patient Active Problem List   Diagnosis Date Noted  . Carotid artery disease ([place]) 12/01/2016  . CAD- CABG 6/10, low risk Myoview 8/13   . Hypothyroidism   . RBBB   . Chronic anticoagulation   . Chronic renal insufficiency, stage III (moderate)   . PAF (paroxysmal atrial fibrillation) ([place])   . Dyslipidemia   . Peripheral vascular disease ([place]) 08/01/2010  . Hypertension 12/26/2009   Teena Irani, PTA/CLT [phone]  Teena Irani 03/27/2019, 2:54 PM  [place] [address] [place], [place], [address] Phone: [phone]   Fax:  [phone]  Name: Scott [name] MRN: [id number] Date of Birth: Apr 25, 1929

## 2019-03-28 ENCOUNTER — Telehealth (HOSPITAL_COMMUNITY): Payer: Self-pay | Admitting: Physical Therapy

## 2019-03-28 NOTE — Telephone Encounter (Signed)
Called to cx Thursday apptment and r/s Unable to reach pt -phone is out of order. NF

## 2019-03-29 ENCOUNTER — Ambulatory Visit (HOSPITAL_COMMUNITY): Payer: Medicare Other | Admitting: Physical Therapy

## 2019-04-02 ENCOUNTER — Ambulatory Visit (HOSPITAL_COMMUNITY): Payer: Medicare Other | Admitting: Physical Therapy

## 2019-04-02 ENCOUNTER — Other Ambulatory Visit: Payer: Self-pay

## 2019-04-02 DIAGNOSIS — M436 Torticollis: Secondary | ICD-10-CM | POA: Diagnosis not present

## 2019-04-02 DIAGNOSIS — S1190XD Unspecified open wound of unspecified part of neck, subsequent encounter: Secondary | ICD-10-CM | POA: Diagnosis not present

## 2019-04-02 NOTE — Therapy (Signed)
[place], [place], [address] Phone: [phone]   Fax:  [phone]  Wound Care Therapy  Patient Details  Name: Scott [name] MRN: [id number] Date of Birth: [birth year] Referring Provider (PT): Collene Mares   Encounter Date: 04/02/2019   Progress Note Reporting Period 03/02/2019 to 04/02/2019  See note below for Objective Data and Assessment of Progress/Goals.      PT End of Session - 04/02/19 1354    Visit Number  10    Number of Visits  14    Date for PT Re-Evaluation  04/12/19    Authorization Type  Medicare + BCBS    Authorization Time Period  1/22 thru 04/01/2019    Authorization - Visit Number  10    Authorization - Number of Visits  14    PT Start Time  1320    PT Stop Time  1345    PT Time Calculation (min)  25 min    Activity Tolerance  Patient tolerated treatment well    Behavior During Therapy  [facility] for tasks assessed/performed       Past Medical History:  Diagnosis Date  . Chronic anticoagulation   . Chronic renal insufficiency, stage III (moderate)   . Coronary artery disease 2010   CABG  . Dyslipidemia   . Hypertension 12/26/09   renal doppler  . Hypothyroidism   . PAF (paroxysmal atrial fibrillation) ([place])   . Peripheral vascular disease ([place]) 08/01/10   Carotid doppler: 12/26/09 lower arterial doppler  . RBBB   . Shingles April 2012    Past Surgical History:  Procedure Laterality Date  . CARDIAC CATHETERIZATION  6/10  . CORONARY ARTERY BYPASS GRAFT  2010   3-vessel by-pass  . HERNIA REPAIR  3/09  . POPLITEAL ARTERY ANGIOPLASTY Right   . PROSTATE SURGERY  11/99  . RENAL ARTERY ANGIOPLASTY Left 9/11    There were no vitals filed for this visit.     [facility] PT Assessment - 04/02/19 0001      Assessment   Medical Diagnosis  Posterior cervical wound nonhealing     Referring Provider (PT)  Collene Mares    Onset Date/Surgical Date  02/09/19    Next MD Visit  not  scheduled    Prior Therapy  none      Precautions   Precautions  Other (comment)   cellulitis             Wound Therapy - 04/02/19 1347    Subjective  Pt states that he has no pain    Patient and Family Stated Goals  wound to heal     Date of Onset  02/09/19    Prior Treatments  see above, wife is changing dressings daily     Evaluation and Treatment Procedures Explained to Patient/Family  Yes    Evaluation and Treatment Procedures  agreed to    Wound Properties Date First Assessed: 03/02/19 Time First Assessed: 0925 Wound Type: Other (Comment) Location: Cervical Location Orientation: Posterior Present on Admission: Yes   Dressing Type  Gauze (Comment)    Dressing Changed  Changed    Dressing Status  Old drainage    Dressing Change Frequency  PRN    Site / Wound Assessment  Granulation tissue;Other (Comment)    % Wound base Red or Granulating  70%   was 0%    % Wound base Yellow/Fibrinous Exudate  30%   was 75%   % Wound base  Other/Granulation Tissue (Comment)  0%   was 25%   Peri-wound Assessment  Intact    Wound Length (cm)  1.3 cm   was 2.8   Wound Width (cm)  0.9 cm   was 2.6   Wound Depth (cm)  --   hypergranulating    Wound Surface Area (cm^2)  1.17 cm^2    Margins  Attached edges (approximated)    Closure  None    Drainage Amount  Minimal    Drainage Description  Serosanguineous    Treatment  Cleansed;Debridement (Selective)    Selective Debridement - Location  wound bed    eschar   Selective Debridement - Tools Used  Forceps;Scalpel    Selective Debridement - Tissue Removed  slough and devitalized tissue      Wound Therapy - Clinical Statement  Wound continues to approximate but continues to have adherent eschar .  The pt can not care for this wound due to there being adherent eschar that needs sharp debridement and hypergranulation which needs pressure to reduce.      Wound Therapy - Functional Problem List  Decreased cervical ROM due to pt hesitation to  move cervical area.     Factors Delaying/Impairing Wound Healing  Polypharmacy    Hydrotherapy Plan  Debridement;Dressing change;Patient/family education    Wound Therapy - Frequency  2X / week   for four weeks ; 04/02/2019:  additional 2x a week for 2 more   Wound Therapy - Current Recommendations  PT    Wound Plan  conitnue with debridement and  appropriate dressings to promote healing.     Dressing   medihoney, 2x2 ,foam  f/b medipore tape                 PT Short Term Goals - 04/02/19 1356      PT SHORT TERM GOAL #1   Title  PT wound to be 80% granulated    Baseline  25    Time  2    Period  Weeks    Status  On-going    Target Date  03/16/19      PT SHORT TERM GOAL #2   Title  Wound size to have decreased 1 cm in width and length.    Time  2    Period  Weeks    Status  Achieved        PT Long Term Goals - 03/22/19 1409      PT LONG TERM GOAL #1   Title  Wound to be 100% granulated with scant drainage    Time  4    Period  Weeks    Status  On-going      PT LONG TERM GOAL #2   Title  wound to be no greater than .5x.5 to allow pt to complete self care by using a bandaid at home.    Time  4    Period  Weeks    Status  On-going            Plan - 04/02/19 1354    Clinical Impression Statement  PT will continue to benefit from skilled PT due to having adherent eschar on his existing wound as well as having hypergranulation    Personal Factors and Comorbidities  Age;Comorbidity 2    Comorbidities  HTN, CAD, PVD,    Examination-Activity Limitations  Bathing;Dressing    Stability/Clinical Decision Making  Stable/Uncomplicated    Rehab Potential  Good    PT Frequency  2x / week    PT Duration  4 weeks   add 3 weeks for a total of seven weeks.   PT Treatment/Interventions  ADLs/Self Care Home Management;Other (comment);Patient/family education;Therapeutic exercise    PT Next Visit Plan  Continue with debridement of wound and proper dressing change     Consulted and Agree with Plan of Care  Patient       Patient will benefit from skilled therapeutic intervention in order to improve the following deficits and impairments:  Decreased skin integrity, Decreased range of motion  Visit Diagnosis: Open neck wound, subsequent encounter  Stiffness of neck     Problem List Patient Active Problem List   Diagnosis Date Noted  . Carotid artery disease ([facility]) 12/01/2016  . CAD- CABG 6/10, low risk Myoview 8/13   . Hypothyroidism   . RBBB   . Chronic anticoagulation   . Chronic renal insufficiency, stage III (moderate)   . PAF (paroxysmal atrial fibrillation) ([facility])   . Dyslipidemia   . Peripheral vascular disease ([facility]) 08/01/2010  . Hypertension 12/26/2009    Virgina Organ, PT CLT [phone] 04/02/2019, 1:57 PM  [facility] [address] [place], [place], [address] Phone: [phone]   Fax:  [phone]  Name: Scott [name] MRN: [id number] Date of Birth: 1929/09/12

## 2019-04-05 ENCOUNTER — Ambulatory Visit (HOSPITAL_COMMUNITY): Payer: Medicare Other | Admitting: Physical Therapy

## 2019-04-05 ENCOUNTER — Encounter (HOSPITAL_COMMUNITY): Payer: Self-pay | Admitting: Physical Therapy

## 2019-04-05 ENCOUNTER — Other Ambulatory Visit: Payer: Self-pay

## 2019-04-05 DIAGNOSIS — M436 Torticollis: Secondary | ICD-10-CM

## 2019-04-05 DIAGNOSIS — S1190XD Unspecified open wound of unspecified part of neck, subsequent encounter: Secondary | ICD-10-CM | POA: Diagnosis not present

## 2019-04-05 NOTE — Therapy (Signed)
[facility] [address] [place], [place], [address] Phone: [phone]   Fax:  [phone]  Wound Care Therapy  Patient Details  Name: Scott [name] MRN: [id number] Date of Birth: Jul 18, 1929 Referring Provider (PT): Lenise Herald   Encounter Date: 04/05/2019  PT End of Session - 04/05/19 1353    Visit Number  11    Number of Visits  14    Date for PT Re-Evaluation  04/12/19    Authorization Type  Medicare + BCBS    Authorization Time Period  1/22 thru 04/01/2019    Authorization - Visit Number  11    Authorization - Number of Visits  14    PT Start Time  1315    PT Stop Time  1335    PT Time Calculation (min)  20 min    Activity Tolerance  Patient tolerated treatment well    Behavior During Therapy  [facility] for tasks assessed/performed       Past Medical History:  Diagnosis Date  . Chronic anticoagulation   . Chronic renal insufficiency, stage III (moderate)   . Coronary artery disease 2010   CABG  . Dyslipidemia   . Hypertension 12/26/09   renal doppler  . Hypothyroidism   . PAF (paroxysmal atrial fibrillation) (HCC)   . Peripheral vascular disease (HCC) 08/01/10   Carotid doppler: 12/26/09 lower arterial doppler  . RBBB   . Shingles April 2012    Past Surgical History:  Procedure Laterality Date  . CARDIAC CATHETERIZATION  6/10  . CORONARY ARTERY BYPASS GRAFT  2010   3-vessel by-pass  . HERNIA REPAIR  3/09  . POPLITEAL ARTERY ANGIOPLASTY Right   . PROSTATE SURGERY  11/99  . RENAL ARTERY ANGIOPLASTY Left 9/11    There were no vitals filed for this visit.              Wound Therapy - 04/05/19 1349    Subjective  PT states that he is not having any itching or any pain today.     Patient and Family Stated Goals  wound to heal     Date of Onset  02/09/19    Prior Treatments  see above, wife is changing dressings daily     Evaluation and Treatment Procedures Explained to Patient/Family  Yes    Evaluation  and Treatment Procedures  agreed to    Wound Properties Date First Assessed: 03/02/19 Time First Assessed: 0925 Wound Type: Other (Comment) Location: Cervical Location Orientation: Posterior Present on Admission: Yes   Dressing Type  Gauze (Comment)    Dressing Changed  Changed    Dressing Status  Old drainage    Dressing Change Frequency  PRN    Site / Wound Assessment  Granulation tissue;Other (Comment)    % Wound base Red or Granulating  80%   was 0%    % Wound base Yellow/Fibrinous Exudate  20%   was 75%   % Wound base Other/Granulation Tissue (Comment)  0%   was 25%   Peri-wound Assessment  Intact    Margins  Attached edges (approximated)    Closure  None    Drainage Amount  Minimal    Drainage Description  Serosanguineous    Treatment  Cleansed;Debridement (Selective)    Selective Debridement - Location  wound bed    eschar   Selective Debridement - Tools Used  Forceps;Scalpel    Selective Debridement - Tissue Removed  slough and devitalized tissue   scored area  as it is very adherent, followed by medihoney    Wound Therapy - Clinical Statement  Foam significantly decreased hypergranulation, decreased adherent eschar with todays debridement.  PT will continue to need skilled care as area was nonhealing and is on the posterior aspect of pt neck therefore he is not able to readily care for the wound himself.     Wound Therapy - Functional Problem List  Decreased cervical ROM due to pt hesitation to move cervical area.     Factors Delaying/Impairing Wound Healing  Polypharmacy    Hydrotherapy Plan  Debridement;Dressing change;Patient/family education    Wound Therapy - Frequency  2X / week   for four weeks ; 04/02/2019:  additional 2x a week for 2 more   Wound Therapy - Current Recommendations  PT    Wound Plan  conitnue with debridement and  appropriate dressings to promote healing.     Dressing   medihoney, 2x2 ,foam  f/b medipore tape                 PT Short Term  Goals - 04/05/19 1354      PT SHORT TERM GOAL #1   Title  PT wound to be 80% granulated    Baseline  25    Time  2    Period  Weeks    Status  Achieved    Target Date  03/16/19      PT SHORT TERM GOAL #2   Title  Wound size to have decreased 1 cm in width and length.    Time  2    Period  Weeks    Status  Achieved        PT Long Term Goals - 04/05/19 1354      PT LONG TERM GOAL #1   Title  Wound to be 100% granulated with scant drainage    Time  4    Period  Weeks    Status  On-going      PT LONG TERM GOAL #2   Title  wound to be no greater than .5x.5 to allow pt to complete self care by using a bandaid at home.    Time  4    Period  Weeks    Status  On-going            Plan - 04/05/19 1353    Clinical Impression Statement  as above    Personal Factors and Comorbidities  Age;Comorbidity 2    Comorbidities  HTN, CAD, PVD,    Examination-Activity Limitations  Bathing;Dressing    Stability/Clinical Decision Making  Stable/Uncomplicated    Rehab Potential  Good    PT Frequency  2x / week    PT Duration  4 weeks   add 3 weeks for a total of seven weeks.   PT Treatment/Interventions  ADLs/Self Care Home Management;Other (comment);Patient/family education;Therapeutic exercise    PT Next Visit Plan  Continue with debridement of wound and proper dressing change    Consulted and Agree with Plan of Care  Patient       Patient will benefit from skilled therapeutic intervention in order to improve the following deficits and impairments:  Decreased skin integrity, Decreased range of motion  Visit Diagnosis: Stiffness of neck  Open neck wound, subsequent encounter     Problem List Patient Active Problem List   Diagnosis Date Noted  . Carotid artery disease (HCC) 12/01/2016  . CAD- CABG 6/10, low risk Myoview 8/13   . Hypothyroidism   .  RBBB   . Chronic anticoagulation   . Chronic renal insufficiency, stage III (moderate)   . PAF (paroxysmal atrial  fibrillation) ([place])   . Dyslipidemia   . Peripheral vascular disease ([place]) 08/01/2010  . Hypertension 12/26/2009    Rayetta Humphrey, PT CLT [phone] 04/05/2019, 1:54 PM  [facility] [address] [place], [place], [address] Phone: [phone]   Fax:  [phone]  Name: Scott [name] MRN: [id number] Date of Birth: March 30, 1929

## 2019-04-08 DIAGNOSIS — N1832 Chronic kidney disease, stage 3b: Secondary | ICD-10-CM | POA: Diagnosis not present

## 2019-04-08 DIAGNOSIS — I129 Hypertensive chronic kidney disease with stage 1 through stage 4 chronic kidney disease, or unspecified chronic kidney disease: Secondary | ICD-10-CM | POA: Diagnosis not present

## 2019-04-08 DIAGNOSIS — E1122 Type 2 diabetes mellitus with diabetic chronic kidney disease: Secondary | ICD-10-CM | POA: Diagnosis not present

## 2019-04-08 DIAGNOSIS — E063 Autoimmune thyroiditis: Secondary | ICD-10-CM | POA: Diagnosis not present

## 2019-04-10 ENCOUNTER — Other Ambulatory Visit: Payer: Self-pay

## 2019-04-10 ENCOUNTER — Ambulatory Visit (HOSPITAL_COMMUNITY): Payer: Medicare Other | Attending: Physician Assistant | Admitting: Physical Therapy

## 2019-04-10 DIAGNOSIS — M436 Torticollis: Secondary | ICD-10-CM | POA: Insufficient documentation

## 2019-04-10 DIAGNOSIS — S1190XD Unspecified open wound of unspecified part of neck, subsequent encounter: Secondary | ICD-10-CM | POA: Diagnosis not present

## 2019-04-10 NOTE — Therapy (Signed)
[place], [place], [address] Phone: [phone]   Fax:  [phone]  Wound Care Therapy  Patient Details  Name: Scott [name] MRN: [id number] Date of Birth: June 04, 1929 Referring Provider (PT): Collene Mares   Encounter Date: 04/10/2019  PT End of Session - 04/10/19 1347    Visit Number  12    Number of Visits  14    Date for PT Re-Evaluation  04/12/19    Authorization Type  Medicare + BCBS    Authorization Time Period  1/22 thru 04/01/2019    Authorization - Visit Number  12    Authorization - Number of Visits  14    Progress Note Due on Visit  14    PT Start Time  1417    PT Stop Time  1440    PT Time Calculation (min)  23 min    Activity Tolerance  Patient tolerated treatment well    Behavior During Therapy  [facility] for tasks assessed/performed       Past Medical History:  Diagnosis Date  . Chronic anticoagulation   . Chronic renal insufficiency, stage III (moderate)   . Coronary artery disease 2010   CABG  . Dyslipidemia   . Hypertension 12/26/09   renal doppler  . Hypothyroidism   . PAF (paroxysmal atrial fibrillation) ([place])   . Peripheral vascular disease ([place]) 08/01/10   Carotid doppler: 12/26/09 lower arterial doppler  . RBBB   . Shingles April 2012    Past Surgical History:  Procedure Laterality Date  . CARDIAC CATHETERIZATION  6/10  . CORONARY ARTERY BYPASS GRAFT  2010   3-vessel by-pass  . HERNIA REPAIR  3/09  . POPLITEAL ARTERY ANGIOPLASTY Right   . PROSTATE SURGERY  11/99  . RENAL ARTERY ANGIOPLASTY Left 9/11    There were no vitals filed for this visit.              Wound Therapy - 04/10/19 1343    Subjective  PT states that he is not having any itching or any pain today.     Patient and Family Stated Goals  wound to heal     Date of Onset  02/09/19    Prior Treatments  see above, wife is changing dressings daily     Evaluation and Treatment Procedures Explained to  Patient/Family  Yes    Evaluation and Treatment Procedures  agreed to    Wound Properties Date First Assessed: 03/02/19 Time First Assessed: 0925 Wound Type: Other (Comment) Location: Cervical Location Orientation: Posterior Present on Admission: Yes   Dressing Type  Gauze (Comment)    Dressing Status  Old drainage    Dressing Change Frequency  PRN    Site / Wound Assessment  Granulation tissue;Other (Comment)    % Wound base Red or Granulating  90%   was 0%    % Wound base Yellow/Fibrinous Exudate  10%   was 75%   % Wound base Other/Granulation Tissue (Comment)  0%   was 25%   Peri-wound Assessment  Intact    Wound Length (cm)  1.2 cm    Wound Width (cm)  0.8 cm    Wound Surface Area (cm^2)  0.96 cm^2    Margins  Attached edges (approximated)    Closure  None    Drainage Amount  Minimal    Drainage Description  Serosanguineous    Treatment  Cleansed;Debridement (Selective)    Selective Debridement - Location  wound  bed    eschar   Selective Debridement - Tools Used  Forceps;Scalpel    Selective Debridement - Tissue Removed  slough much easier to remove    scored area as it is very adherent, followed by medihoney    Wound Therapy - Clinical Statement  Pt wound continues to slowly granulate as well as approximate.  Pt needed minimal debridement.  Soft tissue massage to area surrounding wound as there is increased scar tissue which is most likely inhibiting healing.     Wound Therapy - Functional Problem List  Decreased cervical ROM due to pt hesitation to move cervical area.     Factors Delaying/Impairing Wound Healing  Polypharmacy    Hydrotherapy Plan  Debridement;Dressing change;Patient/family education    Wound Therapy - Frequency  2X / week   for four weeks ; 04/02/2019:  additional 2x a week for 2 more   Wound Therapy - Current Recommendations  PT    Wound Plan  conitnue with manual, debridement and  appropriate dressings to promote healing.     Dressing   medihoney, 2x2 ,foam   f/b medipore tape                 PT Short Term Goals - 04/05/19 1354      PT SHORT TERM GOAL #1   Title  PT wound to be 80% granulated    Baseline  25    Time  2    Period  Weeks    Status  Achieved    Target Date  03/16/19      PT SHORT TERM GOAL #2   Title  Wound size to have decreased 1 cm in width and length.    Time  2    Period  Weeks    Status  Achieved        PT Long Term Goals - 04/05/19 1354      PT LONG TERM GOAL #1   Title  Wound to be 100% granulated with scant drainage    Time  4    Period  Weeks    Status  On-going      PT LONG TERM GOAL #2   Title  wound to be no greater than .5x.5 to allow pt to complete self care by using a bandaid at home.    Time  4    Period  Weeks    Status  On-going            Plan - 04/10/19 1349    Clinical Impression Statement  as above    Personal Factors and Comorbidities  Age;Comorbidity 2    Comorbidities  HTN, CAD, PVD,    Examination-Activity Limitations  Bathing;Dressing    Stability/Clinical Decision Making  Stable/Uncomplicated    Rehab Potential  Good    PT Frequency  2x / week    PT Duration  4 weeks   add 3 weeks for a total of seven weeks.   PT Treatment/Interventions  ADLs/Self Care Home Management;Other (comment);Patient/family education;Therapeutic exercise    PT Next Visit Plan  Continue with debridementas needed , manual and proper dressing change    Consulted and Agree with Plan of Care  Patient       Patient will benefit from skilled therapeutic intervention in order to improve the following deficits and impairments:  Decreased skin integrity, Decreased range of motion  Visit Diagnosis: Stiffness of neck  Open neck wound, subsequent encounter     Problem List Patient Active  Problem List   Diagnosis Date Noted  . Carotid artery disease (HCC) 12/01/2016  . CAD- CABG 6/10, low risk Myoview 8/13   . Hypothyroidism   . RBBB   . Chronic anticoagulation   . Chronic renal  insufficiency, stage III (moderate)   . PAF (paroxysmal atrial fibrillation) (HCC)   . Dyslipidemia   . Peripheral vascular disease (HCC) 08/01/2010  . Hypertension 12/26/2009    Virgina Organ, PT CLT [phone] 04/10/2019, 1:51 PM  [facility] [address] [place], [place], [address] Phone: [phone]   Fax:  [phone]  Name: Scott [name] MRN: [id number] Date of Birth: 1929-06-29

## 2019-04-12 ENCOUNTER — Encounter (HOSPITAL_COMMUNITY): Payer: Self-pay

## 2019-04-12 ENCOUNTER — Other Ambulatory Visit: Payer: Self-pay

## 2019-04-12 ENCOUNTER — Ambulatory Visit (HOSPITAL_COMMUNITY): Payer: Medicare Other

## 2019-04-12 DIAGNOSIS — S1190XD Unspecified open wound of unspecified part of neck, subsequent encounter: Secondary | ICD-10-CM | POA: Diagnosis not present

## 2019-04-12 DIAGNOSIS — M436 Torticollis: Secondary | ICD-10-CM | POA: Diagnosis not present

## 2019-04-12 NOTE — Therapy (Addendum)
[facility] [address] [place], [place], [address] Phone: [phone]   Fax:  [phone]  Wound Care Therapy  Patient Details  Name: Scott [name] MRN: [id number] Date of Birth: 1929-03-12 Referring Provider (PT): Lenise Herald   Encounter Date: 04/12/2019  PT End of Session - 04/12/19 1422    Visit Number  13    Number of Visits  17   Date for PT Re-Evaluation  04/12/19    Authorization Type  Medicare + BCBS    Authorization Time Period  1/22 thru 04/01/2019 thru 3/4/ thru 5/19   Authorization - Visit Number  13    Authorization - Number of Visits  14    Progress Note Due on Visit  17   PT Start Time  1350    PT Stop Time  1413    PT Time Calculation (min)  23 min    Activity Tolerance  Patient tolerated treatment well    Behavior During Therapy  [facility] for tasks assessed/performed       Past Medical History:  Diagnosis Date  . Chronic anticoagulation   . Chronic renal insufficiency, stage III (moderate)   . Coronary artery disease 2010   CABG  . Dyslipidemia   . Hypertension 12/26/09   renal doppler  . Hypothyroidism   . PAF (paroxysmal atrial fibrillation) (HCC)   . Peripheral vascular disease (HCC) 08/01/10   Carotid doppler: 12/26/09 lower arterial doppler  . RBBB   . Shingles April 2012    Past Surgical History:  Procedure Laterality Date  . CARDIAC CATHETERIZATION  6/10  . CORONARY ARTERY BYPASS GRAFT  2010   3-vessel by-pass  . HERNIA REPAIR  3/09  . POPLITEAL ARTERY ANGIOPLASTY Right   . PROSTATE SURGERY  11/99  . RENAL ARTERY ANGIOPLASTY Left 9/11    There were no vitals filed for this visit.   Subjective Assessment - 04/12/19 1413    Subjective  Pt stated he's happy with wound progress, no reports of pain today    Currently in Pain?  No/denies                Wound Therapy - 04/12/19 1414    Subjective  Pt stated he's happy with wound progress, no reports of pain today    Patient and  Family Stated Goals  wound to heal     Date of Onset  02/09/19    Prior Treatments  see above, wife is changing dressings daily     Pain Scale  0-10    Pain Score  0-No pain    Evaluation and Treatment Procedures Explained to Patient/Family  Yes    Evaluation and Treatment Procedures  agreed to    Wound Properties Date First Assessed: 03/02/19 Time First Assessed: 0925 Wound Type: Other (Comment) Location: Cervical Location Orientation: Posterior Present on Admission: Yes   Dressing Type  Gauze (Comment)   medihoney, 2x2, foam and medipore tape   Dressing Changed  Changed    Dressing Status  Old drainage    Dressing Change Frequency  PRN    Site / Wound Assessment  Granulation tissue;Other (Comment)    % Wound base Red or Granulating  95%   was    % Wound base Yellow/Fibrinous Exudate  5%    % Wound base Other/Granulation Tissue (Comment)  0%    Peri-wound Assessment  Intact    Wound Length (cm)  1 cm   was 2.8 initial eval 03/02/19  Wound Width (cm)  0.8 cm   was on eval 03/02/19: 2.6 superior aspect; 1.8 inferior    Wound Depth (cm)  0 cm    Wound Volume (cm^3)  0 cm^3    Margins  Attached edges (approximated)    Closure  None    Drainage Amount  Minimal    Drainage Description  Serosanguineous    Treatment  Cleansed;Debridement (Selective)    Selective Debridement - Location  wound bed    eschar   Selective Debridement - Tools Used  Forceps;Scalpel    Selective Debridement - Tissue Removed  slough much easier to remove     Wound Therapy - Clinical Statement  Wound continues to improve with granulation tissue and decrease in size.  Pt will continue to benefit from skilled PT for debridement and dressing change until wound is healed due to wound being on the posterior aspect of pt neck and pt unable to sufficiently give self care.  Selective debridement for removal of eschar/slough in wound bed.  Continued wiht medihoney, foam and medipore tape dressings.  No reports of pain through  session.     Wound Therapy - Functional Problem List  Decreased cervical ROM due to pt hesitation to move cervical area.     Factors Delaying/Impairing Wound Healing  Polypharmacy    Hydrotherapy Plan  Debridement;Dressing change;Patient/family education    Wound Therapy - Frequency  2X / week   for four weeks ; 04/02/2019:  additional 2x a week for 2 more; 3/5 2x week for 2 more weeks   Wound Therapy - Current Recommendations  PT    Wound Plan  conitnue with manual, debridement and  appropriate dressings to promote healing.     Dressing   medihoney, 2x2 ,foam  f/b medipore tape                 PT Short Term Goals - 04/12/19 1424      PT SHORT TERM GOAL #1   Title  PT wound to be 80% granulated    Status  Achieved      PT SHORT TERM GOAL #2   Title  Wound size to have decreased 1 cm in width and length.    Status  Achieved        PT Long Term Goals - 04/12/19 1423      PT LONG TERM GOAL #1   Title  Wound to be 100% granulated with scant drainage    Status  On-going      PT LONG TERM GOAL #2   Title  wound to be no greater than .5x.5 to allow pt to complete self care by using a bandaid at home.    Status  On-going              Patient will benefit from skilled therapeutic intervention in order to improve the following deficits and impairments:     Visit Diagnosis: Stiffness of neck  Open neck wound, subsequent encounter     Problem List Patient Active Problem List   Diagnosis Date Noted  . Carotid artery disease (HCC) 12/01/2016  . CAD- CABG 6/10, low risk Myoview 8/13   . Hypothyroidism   . RBBB   . Chronic anticoagulation   . Chronic renal insufficiency, stage III (moderate)   . PAF (paroxysmal atrial fibrillation) (HCC)   . Dyslipidemia   . Peripheral vascular disease (HCC) 08/01/2010  . Hypertension 12/26/2009   Becky Sax, LPTA/CLT; CBIS [phone]  Virgina Organ, PT  CLT [phone] 04/12/2019, 2:24 PM  [facility] [address] [place], [place], [address] Phone: [phone]   Fax:  [phone]  Name: Scott [name] MRN: [id number] Date of Birth: [birth year]

## 2019-04-13 NOTE — Addendum Note (Signed)
Addended by: Bella Kennedy on: 04/13/2019 04:35 PM   Modules accepted: Orders

## 2019-04-17 ENCOUNTER — Ambulatory Visit (HOSPITAL_COMMUNITY): Payer: Medicare Other | Admitting: Physical Therapy

## 2019-04-17 ENCOUNTER — Other Ambulatory Visit: Payer: Self-pay

## 2019-04-17 DIAGNOSIS — S1190XD Unspecified open wound of unspecified part of neck, subsequent encounter: Secondary | ICD-10-CM | POA: Diagnosis not present

## 2019-04-17 DIAGNOSIS — M436 Torticollis: Secondary | ICD-10-CM

## 2019-04-17 NOTE — Therapy (Signed)
[facility] [address] [place], [place], [address] Phone: [phone]   Fax:  [phone]  Wound Care Therapy  Patient Details  Name: Scott [name] MRN: [id number] Date of Birth: 1929-10-09 Referring Provider (PT): Lenise Herald   Encounter Date: 04/17/2019  PT End of Session - 04/17/19 1357    Visit Number  14    Number of Visits  15    Date for PT Re-Evaluation  04/27/19    Authorization Type  Medicare + BCBS    Authorization Time Period  1/22 thru 04/01/2019; 2/21 thru 3/4; 3/5 thru 3/19    Authorization - Visit Number  14    Authorization - Number of Visits  15    Progress Note Due on Visit  15    PT Start Time  1320    PT Stop Time  1335    PT Time Calculation (min)  15 min    Activity Tolerance  Patient tolerated treatment well    Behavior During Therapy  [facility] for tasks assessed/performed       Past Medical History:  Diagnosis Date  . Chronic anticoagulation   . Chronic renal insufficiency, stage III (moderate)   . Coronary artery disease 2010   CABG  . Dyslipidemia   . Hypertension 12/26/09   renal doppler  . Hypothyroidism   . PAF (paroxysmal atrial fibrillation) (HCC)   . Peripheral vascular disease (HCC) 08/01/10   Carotid doppler: 12/26/09 lower arterial doppler  . RBBB   . Shingles April 2012    Past Surgical History:  Procedure Laterality Date  . CARDIAC CATHETERIZATION  6/10  . CORONARY ARTERY BYPASS GRAFT  2010   3-vessel by-pass  . HERNIA REPAIR  3/09  . POPLITEAL ARTERY ANGIOPLASTY Right   . PROSTATE SURGERY  11/99  . RENAL ARTERY ANGIOPLASTY Left 9/11    There were no vitals filed for this visit.              Wound Therapy - 04/17/19 1351    Subjective  PT states that he is doing well     Patient and Family Stated Goals  wound to heal     Date of Onset  02/09/19    Prior Treatments  see above, wife is changing dressings daily     Pain Scale  0-10    Pain Score  0-No pain    Evaluation and Treatment Procedures Explained to Patient/Family  Yes    Evaluation and Treatment Procedures  agreed to    Wound Properties Date First Assessed: 03/02/19 Time First Assessed: 0925 Wound Type: Other (Comment) Location: Cervical Location Orientation: Posterior Present on Admission: Yes   Dressing Type  Gauze (Comment)   medihoney, 2x2, foam and medipore tape   Dressing Status  Old drainage    Dressing Change Frequency  PRN    Site / Wound Assessment  Granulation tissue;Other (Comment)    % Wound base Red or Granulating  --   98%   % Wound base Yellow/Fibrinous Exudate  --   2%   % Wound base Other/Granulation Tissue (Comment)  0%    Peri-wound Assessment  Intact    Wound Length (cm)  0.7 cm    Wound Width (cm)  0.6 cm    Wound Surface Area (cm^2)  0.42 cm^2    Margins  Attached edges (approximated)    Closure  None    Drainage Amount  Minimal    Drainage Description  Serosanguineous  Selective Debridement - Location  --    Selective Debridement - Tools Used  --    Selective Debridement - Tissue Removed  --    Wound Therapy - Clinical Statement  Minimal debridement today.  Therapist completed manual along scar tissue surrounding wound to improve circulation to area.  Pt will continue with home self care.  We will assess one more time next week to ensure wound has continued to heal as anticipated.      Wound Therapy - Functional Problem List  Decreased cervical ROM due to pt hesitation to move cervical area.     Factors Delaying/Impairing Wound Healing  Polypharmacy    Hydrotherapy Plan  Debridement;Dressing change;Patient/family education    Wound Therapy - Frequency  2X / week   for four weeks ; 04/02/2019:  additional 2x a week for 2 more   Wound Therapy - Current Recommendations  PT    Wound Plan  See one more visit; if wound is continuing to heal as anticipated discharge.     Dressing   bandaid                 PT Short Term Goals - 04/12/19 1424      PT  SHORT TERM GOAL #1   Title  PT wound to be 80% granulated    Status  Achieved      PT SHORT TERM GOAL #2   Title  Wound size to have decreased 1 cm in width and length.    Status  Achieved        PT Long Term Goals - 04/12/19 1423      PT LONG TERM GOAL #1   Title  Wound to be 100% granulated with scant drainage    Status  On-going      PT LONG TERM GOAL #2   Title  wound to be no greater than .5x.5 to allow pt to complete self care by using a bandaid at home.    Status  On-going            Plan - 04/17/19 1357    Clinical Impression Statement  as above    Personal Factors and Comorbidities  Age;Comorbidity 2    Comorbidities  HTN, CAD, PVD,    Examination-Activity Limitations  Bathing;Dressing    Stability/Clinical Decision Making  Stable/Uncomplicated    Rehab Potential  Good    PT Frequency  2x / week    PT Duration  4 weeks   add 3 weeks for a total of seven weeks.   PT Treatment/Interventions  ADLs/Self Care Home Management;Other (comment);Patient/family education;Therapeutic exercise    PT Next Visit Plan  anticipate discharge next visit.    Consulted and Agree with Plan of Care  Patient       Patient will benefit from skilled therapeutic intervention in order to improve the following deficits and impairments:  Decreased skin integrity, Decreased range of motion  Visit Diagnosis: Open neck wound, subsequent encounter  Stiffness of neck     Problem List Patient Active Problem List   Diagnosis Date Noted  . Carotid artery disease (HCC) 12/01/2016  . CAD- CABG 6/10, low risk Myoview 8/13   . Hypothyroidism   . RBBB   . Chronic anticoagulation   . Chronic renal insufficiency, stage III (moderate)   . PAF (paroxysmal atrial fibrillation) (HCC)   . Dyslipidemia   . Peripheral vascular disease (HCC) 08/01/2010  . Hypertension 12/26/2009  Virgina Organ, PT CLT [phone] 04/17/2019, 1:59  PM  [place], [place], [address] Phone: [phone]   Fax:  [phone]  Name: Scott [name] MRN: [id number] Date of Birth: 1929-06-27

## 2019-04-25 ENCOUNTER — Telehealth (HOSPITAL_COMMUNITY): Payer: Self-pay | Admitting: Physical Therapy

## 2019-04-25 NOTE — Telephone Encounter (Signed)
pt cancelled appt on tomorrow because he said he feels better

## 2019-04-26 ENCOUNTER — Ambulatory Visit (HOSPITAL_COMMUNITY): Payer: Medicare Other | Admitting: Physical Therapy

## 2019-04-27 ENCOUNTER — Other Ambulatory Visit: Payer: Self-pay | Admitting: Physician Assistant

## 2019-05-23 ENCOUNTER — Encounter: Payer: Self-pay | Admitting: Cardiovascular Disease

## 2019-05-23 ENCOUNTER — Telehealth (INDEPENDENT_AMBULATORY_CARE_PROVIDER_SITE_OTHER): Payer: Medicare Other | Admitting: Cardiovascular Disease

## 2019-05-23 ENCOUNTER — Ambulatory Visit: Payer: Medicare Other | Admitting: Cardiovascular Disease

## 2019-05-23 VITALS — Ht 71.0 in | Wt 165.0 lb

## 2019-05-23 DIAGNOSIS — I6522 Occlusion and stenosis of left carotid artery: Secondary | ICD-10-CM

## 2019-05-23 DIAGNOSIS — I4892 Unspecified atrial flutter: Secondary | ICD-10-CM

## 2019-05-23 DIAGNOSIS — I25708 Atherosclerosis of coronary artery bypass graft(s), unspecified, with other forms of angina pectoris: Secondary | ICD-10-CM | POA: Diagnosis not present

## 2019-05-23 DIAGNOSIS — Z87891 Personal history of nicotine dependence: Secondary | ICD-10-CM

## 2019-05-23 DIAGNOSIS — I739 Peripheral vascular disease, unspecified: Secondary | ICD-10-CM | POA: Diagnosis not present

## 2019-05-23 DIAGNOSIS — E785 Hyperlipidemia, unspecified: Secondary | ICD-10-CM

## 2019-05-23 DIAGNOSIS — I10 Essential (primary) hypertension: Secondary | ICD-10-CM | POA: Diagnosis not present

## 2019-05-23 NOTE — Progress Notes (Signed)
Virtual Visit via Telephone Note   This visit type was conducted due to national recommendations for restrictions regarding the COVID-19 Pandemic (e.g. social distancing) in an effort to limit this patient's exposure and mitigate transmission in our community.  Due to his co-morbid illnesses, this patient is at least at moderate risk for complications without adequate follow up.  This format is felt to be most appropriate for this patient at this time.  The patient did not have access to video technology/had technical difficulties with video requiring transitioning to audio format only (telephone).  All issues noted in this document were discussed and addressed.  No physical exam could be performed with this format.  Please refer to the patient's chart for his  consent to telehealth for [facility].   The patient was identified using 2 identifiers.  Date:  05/23/2019   ID:  Scott [name], DOB 25-Nov-1929, MRN [id number]  Patient Location: Home Provider Location: Office  PCP:  Sharilyn Sites, MD  Cardiologist:  Kate Sable, MD  Electrophysiologist:  None   Evaluation Performed:  Follow-Up Visit  Chief Complaint:  CAD  History of Present Illness:    Scott [name] is a 86 y.o. male with history of coronary artery disease.  He underwent CABGin June 2010 with a LIMA to the LAD, SVG to the OM, SVG to the diagonal, and SVG to the RCA. 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  He has a right bundle branch block.  The patient denies any symptoms of chest pain, palpitations, shortness of breath, lightheadedness, dizziness, leg swelling, orthopnea, PND, and syncope.  His primary complaints relate to arthritis in both shoulders and  arms.  Past Medical History:  Diagnosis Date  . Chronic anticoagulation   . Chronic renal insufficiency, stage III (moderate)   . Coronary artery disease 2010   CABG  . Dyslipidemia   . Hypertension 12/26/09   renal doppler  . Hypothyroidism   . PAF (paroxysmal atrial fibrillation) ([place])   . Peripheral vascular disease ([place]) 08/01/10   Carotid doppler: 12/26/09 lower arterial doppler  . RBBB   . Shingles April 2012   Past Surgical History:  Procedure Laterality Date  . CARDIAC CATHETERIZATION  6/10  . CORONARY ARTERY BYPASS GRAFT  2010   3-vessel by-pass  . HERNIA REPAIR  3/09  . POPLITEAL ARTERY ANGIOPLASTY Right   . PROSTATE SURGERY  11/99  . RENAL ARTERY ANGIOPLASTY Left 9/11     Current Meds  Medication Sig  . amLODipine (NORVASC) 5 MG tablet TAKE (1) TABLET BY MOUTH ONCE DAILY.  [name] atorvastatin (LIPITOR) 20 MG tablet TAKE (1) TABLET BY MOUTH ONCE DAILY.  . cilostazol (PLETAL) 100 MG tablet Take 1 tablet (100 mg total) by mouth 2 (two) times daily.  [name] escitalopram (LEXAPRO) 10 MG tablet Take 10 mg by mouth daily.  [name] glimepiride (AMARYL) 1 MG tablet Take 1 mg by mouth at bedtime.   [name] levothyroxine (SYNTHROID) 112 MCG tablet Take 112 mcg by mouth daily before breakfast.   . losartan-hydrochlorothiazide (HYZAAR) 50-12.5 MG tablet TAKE (1) TABLET BY MOUTH ONCE DAILY.  . metoprolol tartrate (LOPRESSOR) 100 MG tablet TAKE (1) TABLET BY MOUTH TWICE DAILY.  [name] pioglitazone (ACTOS) 15 MG tablet Take 15 mg by mouth daily.   Alveda Reasons 15 MG  TABS tablet TAKE ONE TABLET BY MOUTH ONCE DAILY WITH SUPPER.     Allergies:   Patient has no known allergies.   Social History   Tobacco Use  . Smoking status: Former Smoker    Packs/day: 1.00    Types: Cigarettes    Quit date: 07/28/2008    Years since quitting: 10.8  . Smokeless tobacco: Never Used  Substance Use Topics  . Alcohol use: No    Alcohol/week: 0.0 standard drinks  . Drug use: Not on file     Family Hx: The patient's  family history includes CAD in his brother; CVA (age of onset: 63) in his mother; Diabetes in his father; Stroke in his mother.  ROS:   Please see the history of present illness.     All other systems reviewed and are negative.   Prior CV studies:   The following studies were reviewed today:  Low risk NST in August 2013.  Labs/Other Tests and Data Reviewed:    EKG:  No ECG reviewed.  Recent Labs: No results found for requested labs within last 8760 hours.   Recent Lipid Panel Lab Results  Component Value Date/Time   CHOL 85 (L) 08/02/2016 08:40 AM   TRIG 63 08/02/2016 08:40 AM   HDL 37 (L) 08/02/2016 08:40 AM   CHOLHDL 2.3 08/02/2016 08:40 AM   CHOLHDL 2.2 08/08/2013 09:09 AM   LDLCALC 35 08/02/2016 08:40 AM    Wt Readings from Last 3 Encounters:  05/23/19 165 lb (74.8 kg)  09/15/18 171 lb (77.6 kg)  12/13/17 165 lb (74.8 kg)     Objective:    Vital Signs:  Ht 5\' 11"  (1.803 m)   Wt 165 lb (74.8 kg)   BMI 23.01 kg/m    VITAL SIGNS:  reviewed  ASSESSMENT & PLAN:     1.Atrial flutter: Systemically anticoagulated with low-dose Xarelto 15mg . Asymptomatic. Continue Lopressor100 mg twice daily.   2. Coronary artery disease status post CABG: Currently on Lipitor and metoprolol. Not on aspirin as he is on Xarelto. Low risk nuclear stress test in 2013. Symptomatically stable.  3. Peripheral arterial disease: He takes Lipitor. He is not on aspirin as he is on Xarelto.He is also on Pletal. Symptomatically stable.He tries to stay active.  4. Hypertension:No changes to therapy.  5. Hyperlipidemia: He is on Lipitor.   6.  Carotid artery stenosis: Carotid Dopplers on 09/20/2018 showed chronic occlusion of the left internal carotid artery with mild, 1 to 49% stenosis of the proximal right internal carotid artery.  The right vertebral artery may be chronically occluded or congenitally hypoplastic.   COVID-19 Education: The signs and symptoms of  COVID-19 were discussed with the patient and how to seek care for testing (follow up with PCP or arrange E-visit).  The importance of social distancing was discussed today.  Time:   Today, I have spent 15 minutes with the patient with telehealth technology discussing the above problems.     Medication Adjustments/Labs and Tests Ordered: Current medicines are reviewed at length with the patient today.  Concerns regarding medicines are outlined above.   Tests Ordered: No orders of the defined types were placed in this encounter.   Medication Changes: No orders of the defined types were placed in this encounter.   Follow Up: Office visit in 6 month(s)  Signed, 2014, MD  05/23/2019 10:15 AM    [facility]

## 2019-05-23 NOTE — Patient Instructions (Signed)
Medication Instructions:  Your physician recommends that you continue on your current medications as directed. Please refer to the Current Medication list given to you today.  *If you need a refill on your cardiac medications before your next appointment, please call your pharmacy*   Lab Work: None today If you have labs (blood work) drawn today and your tests are completely normal, you will receive your results only by: . MyChart Message (if you have MyChart) OR . A paper copy in the mail If you have any lab test that is abnormal or we need to change your treatment, we will call you to review the results.   Testing/Procedures: None today   Follow-Up: At [facility], you and your health needs are our priority.  As part of our continuing mission to provide you with exceptional heart care, we have created designated Provider Care Teams.  These Care Teams include your primary Cardiologist (physician) and Advanced Practice Providers (APPs -  Physician Assistants and Nurse Practitioners) who all work together to provide you with the care you need, when you need it.  We recommend signing up for the patient portal called "MyChart".  Sign up information is provided on this After Visit Summary.  MyChart is used to connect with patients for Virtual Visits (Telemedicine).  Patients are able to view lab/test results, encounter notes, upcoming appointments, etc.  Non-urgent messages can be sent to your provider as well.   To learn more about what you can do with MyChart, go to [email or web address].    Your next appointment:   6 month(s)  The format for your next appointment:   In Person  Provider:   Suresh Koneswaran, MD   Other Instructions None      Thank you for choosing [facility] !         

## 2019-06-18 ENCOUNTER — Other Ambulatory Visit: Payer: Self-pay | Admitting: Cardiovascular Disease

## 2019-06-22 DIAGNOSIS — Z23 Encounter for immunization: Secondary | ICD-10-CM | POA: Diagnosis not present

## 2019-06-28 ENCOUNTER — Other Ambulatory Visit: Payer: Self-pay | Admitting: Physician Assistant

## 2019-07-04 ENCOUNTER — Other Ambulatory Visit: Payer: Self-pay | Admitting: Cardiovascular Disease

## 2019-07-20 DIAGNOSIS — Z23 Encounter for immunization: Secondary | ICD-10-CM | POA: Diagnosis not present

## 2019-08-14 ENCOUNTER — Other Ambulatory Visit: Payer: Self-pay | Admitting: Cardiovascular Disease

## 2019-09-17 ENCOUNTER — Other Ambulatory Visit: Payer: Self-pay

## 2019-09-17 MED ORDER — METOPROLOL TARTRATE 100 MG PO TABS: ORAL_TABLET | ORAL | 3 refills | Status: DC

## 2019-09-17 NOTE — Telephone Encounter (Signed)
Refilled Lopressor 100 mg twice a day

## 2019-09-26 ENCOUNTER — Other Ambulatory Visit: Payer: Self-pay

## 2019-09-26 MED ORDER — RIVAROXABAN 15 MG PO TABS: ORAL_TABLET | ORAL | 11 refills | Status: DC

## 2019-09-26 NOTE — Telephone Encounter (Signed)
Refilled xarelto

## 2019-10-30 ENCOUNTER — Encounter (HOSPITAL_COMMUNITY): Payer: Self-pay | Admitting: Physical Therapy

## 2019-10-30 NOTE — Therapy (Signed)
[facility] [place], [place], [address] Phone: [phone]   Fax:  [phone]  Patient Details  Name: Scott [name] MRN: [id number] Date of Birth: 1930-02-08 Referring Provider:  No ref. provider found  Encounter Date: 10/30/2019   PHYSICAL THERAPY DISCHARGE SUMMARY  Visits from Start of Care: 15  Current functional level related to goals / functional outcomes: Pt wound is 98% granulated,  size .7x .6 at last appointment.    Remaining deficits: Wound    Education / Equipmentself care  Plan: Patient agrees to discharge.  Patient goals were not met. Patient is being discharged due to not returning since the last visit.  ?????      Rayetta Humphrey, PT CLT [phone] 10/30/2019, 2:52 PM  [facility] [address] [place], [place], [address] Phone: [phone]   Fax:  [phone]

## 2019-11-06 ENCOUNTER — Other Ambulatory Visit: Payer: Self-pay | Admitting: Cardiology

## 2019-11-13 ENCOUNTER — Other Ambulatory Visit: Payer: Self-pay | Admitting: Student

## 2019-11-13 NOTE — Telephone Encounter (Signed)
This is a [place] pt.  °

## 2019-12-07 ENCOUNTER — Encounter (INDEPENDENT_AMBULATORY_CARE_PROVIDER_SITE_OTHER): Payer: Self-pay

## 2019-12-07 ENCOUNTER — Ambulatory Visit (INDEPENDENT_AMBULATORY_CARE_PROVIDER_SITE_OTHER): Payer: Medicare Other | Admitting: Cardiology

## 2019-12-07 ENCOUNTER — Other Ambulatory Visit: Payer: Self-pay

## 2019-12-07 ENCOUNTER — Encounter: Payer: Self-pay | Admitting: Cardiology

## 2019-12-07 ENCOUNTER — Ambulatory Visit: Payer: Medicare Other | Admitting: Cardiovascular Disease

## 2019-12-07 VITALS — BP 108/58 | Ht 71.0 in | Wt 165.2 lb

## 2019-12-07 DIAGNOSIS — I4892 Unspecified atrial flutter: Secondary | ICD-10-CM

## 2019-12-07 DIAGNOSIS — I480 Paroxysmal atrial fibrillation: Secondary | ICD-10-CM

## 2019-12-07 DIAGNOSIS — I6523 Occlusion and stenosis of bilateral carotid arteries: Secondary | ICD-10-CM | POA: Diagnosis not present

## 2019-12-07 DIAGNOSIS — I25810 Atherosclerosis of coronary artery bypass graft(s) without angina pectoris: Secondary | ICD-10-CM | POA: Diagnosis not present

## 2019-12-07 DIAGNOSIS — I6522 Occlusion and stenosis of left carotid artery: Secondary | ICD-10-CM | POA: Diagnosis not present

## 2019-12-07 NOTE — Progress Notes (Signed)
Clinical Summary Mr. Giroux is a 86 y.o.male seen today for follow up of the following medical problems.   1. CAD - prior CABG in 2010 with a LIMA to the LAD, SVG to the OM, SVG to the diagonal, and SVG to the RCA - no recent chest pains. No SOB or DOE - compliatn with meds  2. HTN - compliant with meds   3. Hyperlipidemia - compliant with meds  4. Carotid stenosis - LICA 100% occlusion -Carotid Dopplers on 09/20/2018 showed chronic occlusion of the left internal carotid artery with mild, 1 to 49% stenosis of the proximal right internal carotid artery.   5. Renal artery stenosis - has had prior left renal artery stent   6. Aflutter - no recent palpitations - no bleeding on xarelto  7. PAD - prior right popliteal intervention - no recent claudication pains  COVID vaccine x 2, moderna   Past Medical History:  Diagnosis Date  . Chronic anticoagulation   . Chronic renal insufficiency, stage III (moderate)   . Coronary artery disease 2010   CABG  . Dyslipidemia   . Hypertension 12/26/09   renal doppler  . Hypothyroidism   . PAF (paroxysmal atrial fibrillation) ([facility])   . Peripheral vascular disease ([facility]) 08/01/10   Carotid doppler: 12/26/09 lower arterial doppler  . RBBB   . Shingles April 2012     No Known Allergies   Current Outpatient Medications  Medication Sig Dispense Refill  . amLODipine (NORVASC) 5 MG tablet TAKE (1) TABLET BY MOUTH ONCE DAILY. 90 tablet 0  . atorvastatin (LIPITOR) 20 MG tablet TAKE (1) TABLET BY MOUTH ONCE DAILY. 30 tablet 6  . cilostazol (PLETAL) 100 MG tablet Take 1 tablet (100 mg total) by mouth 2 (two) times daily. 180 tablet 1  . cilostazol (PLETAL) 50 MG tablet TAKE (2) TABLETS BY MOUTH TWICE DAILY. 120 tablet 6  . escitalopram (LEXAPRO) 10 MG tablet Take 10 mg by mouth daily.    [name] glimepiride (AMARYL) 1 MG tablet Take 1 mg by mouth at bedtime.     [name] levothyroxine (SYNTHROID) 112 MCG tablet Take 112 mcg by mouth daily  before breakfast.     . losartan-hydrochlorothiazide (HYZAAR) 50-12.5 MG tablet TAKE (1) TABLET BY MOUTH ONCE DAILY. 90 tablet 3  . metoprolol tartrate (LOPRESSOR) 100 MG tablet TAKE (1) TABLET BY MOUTH TWICE DAILY. 180 tablet 3  . pioglitazone (ACTOS) 15 MG tablet Take 15 mg by mouth daily.     . Rivaroxaban (XARELTO) 15 MG TABS tablet TAKE ONE TABLET BY MOUTH ONCE DAILY WITH SUPPER. 30 tablet 11   No current facility-administered medications for this visit.     Past Surgical History:  Procedure Laterality Date  . CARDIAC CATHETERIZATION  6/10  . CORONARY ARTERY BYPASS GRAFT  2010   3-vessel by-pass  . HERNIA REPAIR  3/09  . POPLITEAL ARTERY ANGIOPLASTY Right   . PROSTATE SURGERY  11/99  . RENAL ARTERY ANGIOPLASTY Left 9/11     No Known Allergies    Family History  Problem Relation Age of Onset  . CVA Mother 26  . Stroke Mother   . Diabetes Father   . CAD Brother      Social History Mr. Brallier reports that he quit smoking about 11 years ago. His smoking use included cigarettes. He smoked 1.00 pack per day. He has never used smokeless tobacco. Mr. Kutner reports no history of alcohol use.   Review of Systems CONSTITUTIONAL: No weight  loss, fever, chills, weakness or fatigue.  HEENT: Eyes: No visual loss, blurred vision, double vision or yellow sclerae.No hearing loss, sneezing, congestion, runny nose or sore throat.  SKIN: No rash or itching.  CARDIOVASCULAR: per hpi RESPIRATORY: No shortness of breath, cough or sputum.  GASTROINTESTINAL: No anorexia, nausea, vomiting or diarrhea. No abdominal pain or blood.  GENITOURINARY: No burning on urination, no polyuria NEUROLOGICAL: No headache, dizziness, syncope, paralysis, ataxia, numbness or tingling in the extremities. No change in bowel or bladder control.  MUSCULOSKELETAL: No muscle, back pain, joint pain or stiffness.  LYMPHATICS: No enlarged nodes. No history of splenectomy.  PSYCHIATRIC: No history of depression or  anxiety.  ENDOCRINOLOGIC: No reports of sweating, cold or heat intolerance. No polyuria or polydipsia.  [name]   Physical Examination Today's Vitals   12/07/19 1332  BP: (!) 108/58  SpO2: 92%  Weight: 165 lb 3.2 oz (74.9 kg)  Height: 5\' 11"  (1.803 m)   Body mass index is 23.04 kg/m.  Gen: resting comfortably, no acute distress HEENT: no scleral icterus, pupils equal round and reactive, no palptable cervical adenopathy,  CV: irreg, no m/r/g no jvd Resp: Clear to auscultation bilaterally GI: abdomen is soft, non-tender, non-distended, normal bowel sounds, no hepatosplenomegaly MSK: extremities are warm, no edema.  Skin: warm, no rash Neuro:  no focal deficits Psych: appropriate affect   Diagnostic Studies 09/2018 carotid 10/2018 IMPRESSION: 1. Chronic occlusion of the left internal carotid artery. 2. Mild (1-49%) stenosis proximal right internal carotid artery secondary to mild heterogeneous atherosclerotic plaque. 3. Nonvisualization of the right vertebral artery which may be chronically occluded or congenitally hypoplastic. 4. Patent dominant left vertebral artery with normal antegrade flow.   05/2012 echo Study Conclusions   - Left ventricle: The cavity size was normal. There was mild  focal basal hypertrophy of the septum. Systolic function  was normal. The estimated ejection fraction was in the  range of 55% to 60%. Doppler parameters are consistent  with abnormal left ventricular relaxation (grade 1  diastolic dysfunction). The E/e' ratio is <10, suggesting  normal LV filling pressure.  - Aortic valve: Sclerosis without stenosis. Trivial  regurgitation.  - Mitral valve: Mildly thickened anterior mitral leaflet  with late systolic prolapse. Mild regurgitation. Valve  area by pressure half-time: 1.65cm^2.  - Left atrium: LA Volume/ BSA = 29.7 ml/m2 The atrium was  mildly dilated.  - Atrial septum: No defect or patent foramen ovale was  identified.    - Pulmonary arteries: PA peak pressure: 72mm Hg (S).  - Inferior vena cava: The vessel was normal in size; the  respirophasic diameter changes were in the normal range (=  50%); findings are consistent with normal central venous  pressure.        Assessment and Plan  1. CAD - no symptoms, continue current meds  2. HTN - continue curernt meds, at goal  3. Hyperlipdiemia - request pcp labs, continue statin  4. Aflutter -- no symptoms, continue beta blocker and xarelto - EKG shows aflutter today  F/u 6 months      36m, M.D.

## 2019-12-07 NOTE — Patient Instructions (Signed)
Medication Instructions:  Your physician recommends that you continue on your current medications as directed. Please refer to the Current Medication list given to you today.  *If you need a refill on your cardiac medications before your next appointment, please call your pharmacy*   Lab Work: None today If you have labs (blood work) drawn today and your tests are completely normal, you will receive your results only by: [name] MyChart Message (if you have MyChart) OR . A paper copy in the mail If you have any lab test that is abnormal or we need to change your treatment, we will call you to review the results.   Testing/Procedures: Your physician has requested that you have a carotid duplex. This test is an ultrasound of the carotid arteries in your neck. It looks at blood flow through these arteries that supply the brain with blood. Allow one hour for this exam. There are no restrictions or special instructions.     Follow-Up: At [facility], you and your health needs are our priority.  As part of our continuing mission to provide you with exceptional heart care, we have created designated Provider Care Teams.  These Care Teams include your primary Cardiologist (physician) and Advanced Practice Providers (APPs -  Physician Assistants and Nurse Practitioners) who all work together to provide you with the care you need, when you need it.  We recommend signing up for the patient portal called "MyChart".  Sign up information is provided on this After Visit Summary.  MyChart is used to connect with patients for Virtual Visits (Telemedicine).  Patients are able to view lab/test results, encounter notes, upcoming appointments, etc.  Non-urgent messages can be sent to your provider as well.   To learn more about what you can do with MyChart, go to [email or web address].    Your next appointment:   6 month(s)  The format for your next appointment:   In Person  Provider:   Karna Dupes   Other Instructions Samples Xarelto 15 mg #21, lot [id number], exp 11/21

## 2019-12-18 ENCOUNTER — Telehealth: Payer: Self-pay | Admitting: Cardiology

## 2019-12-18 NOTE — Telephone Encounter (Signed)
FYI: patient was scheduled for Cartoid 12/20/2019 - cancelled stating that he would call back to re-schedule.

## 2020-01-11 DIAGNOSIS — Z23 Encounter for immunization: Secondary | ICD-10-CM | POA: Diagnosis not present

## 2020-01-16 DIAGNOSIS — E782 Mixed hyperlipidemia: Secondary | ICD-10-CM | POA: Diagnosis not present

## 2020-01-16 DIAGNOSIS — Z0001 Encounter for general adult medical examination with abnormal findings: Secondary | ICD-10-CM | POA: Diagnosis not present

## 2020-01-16 DIAGNOSIS — Z6823 Body mass index (BMI) 23.0-23.9, adult: Secondary | ICD-10-CM | POA: Diagnosis not present

## 2020-01-16 DIAGNOSIS — E1165 Type 2 diabetes mellitus with hyperglycemia: Secondary | ICD-10-CM | POA: Diagnosis not present

## 2020-01-16 DIAGNOSIS — E063 Autoimmune thyroiditis: Secondary | ICD-10-CM | POA: Diagnosis not present

## 2020-01-16 DIAGNOSIS — E1151 Type 2 diabetes mellitus with diabetic peripheral angiopathy without gangrene: Secondary | ICD-10-CM | POA: Diagnosis not present

## 2020-01-16 DIAGNOSIS — E7849 Other hyperlipidemia: Secondary | ICD-10-CM | POA: Diagnosis not present

## 2020-01-16 DIAGNOSIS — Z1331 Encounter for screening for depression: Secondary | ICD-10-CM | POA: Diagnosis not present

## 2020-01-24 DIAGNOSIS — Z23 Encounter for immunization: Secondary | ICD-10-CM | POA: Diagnosis not present

## 2020-02-07 ENCOUNTER — Other Ambulatory Visit: Payer: Self-pay | Admitting: Cardiology

## 2020-05-22 ENCOUNTER — Other Ambulatory Visit: Payer: Self-pay | Admitting: Student

## 2020-05-22 NOTE — Telephone Encounter (Signed)
This is a [place] pt.  °

## 2020-06-25 ENCOUNTER — Telehealth: Payer: Self-pay

## 2020-06-25 MED ORDER — LOSARTAN POTASSIUM-HCTZ 50-12.5 MG PO TABS: ORAL_TABLET | ORAL | 0 refills | Status: DC

## 2020-06-25 NOTE — Telephone Encounter (Signed)
Medication refill request faxed from [facility] for Losartan-HCTZ 50-12.5 mg approved for 30 days. Pt needs follow up appointment

## 2020-07-21 ENCOUNTER — Other Ambulatory Visit: Payer: Self-pay | Admitting: Cardiology

## 2020-07-21 ENCOUNTER — Other Ambulatory Visit: Payer: Self-pay | Admitting: Student

## 2020-08-21 ENCOUNTER — Other Ambulatory Visit: Payer: Self-pay | Admitting: Student

## 2020-08-21 NOTE — Telephone Encounter (Signed)
This is a [place] pt, Dr. Branch 

## 2020-08-25 ENCOUNTER — Telehealth: Payer: Self-pay | Admitting: Cardiology

## 2020-08-25 MED ORDER — CILOSTAZOL 50 MG PO TABS: ORAL_TABLET | ORAL | 0 refills | Status: DC

## 2020-08-25 NOTE — Telephone Encounter (Signed)
Medication refill request approved and sent to the pharmacy.  

## 2020-08-25 NOTE — Telephone Encounter (Signed)
    1. Which medications need to be refilled? (please list name of each medication and dose if known) PLETAL 50 MG   2. Which pharmacy/location (including street and city if local pharmacy) is medication to be sent to?[facility]  3. Do they need a 30 day or 90 day supply? 30

## 2020-09-26 ENCOUNTER — Other Ambulatory Visit: Payer: Self-pay | Admitting: Cardiology

## 2020-10-13 ENCOUNTER — Other Ambulatory Visit: Payer: Self-pay | Admitting: Cardiology

## 2020-10-14 ENCOUNTER — Other Ambulatory Visit: Payer: Self-pay | Admitting: Cardiology

## 2020-10-15 ENCOUNTER — Other Ambulatory Visit: Payer: Self-pay

## 2020-10-15 ENCOUNTER — Encounter: Payer: Self-pay | Admitting: Cardiology

## 2020-10-15 ENCOUNTER — Ambulatory Visit (INDEPENDENT_AMBULATORY_CARE_PROVIDER_SITE_OTHER): Payer: Medicare Other | Admitting: Cardiology

## 2020-10-15 VITALS — BP 96/58 | HR 103 | Ht 71.0 in | Wt 156.0 lb

## 2020-10-15 DIAGNOSIS — I2510 Atherosclerotic heart disease of native coronary artery without angina pectoris: Secondary | ICD-10-CM

## 2020-10-15 DIAGNOSIS — I10 Essential (primary) hypertension: Secondary | ICD-10-CM | POA: Diagnosis not present

## 2020-10-15 DIAGNOSIS — I4892 Unspecified atrial flutter: Secondary | ICD-10-CM

## 2020-10-15 DIAGNOSIS — E782 Mixed hyperlipidemia: Secondary | ICD-10-CM | POA: Diagnosis not present

## 2020-10-15 NOTE — Progress Notes (Signed)
Clinical Summary Mr. Bellina is a 87 y.o.male seen today for follow up of the following medical problems.    1. CAD - prior CABG in 2010 with a LIMA to the LAD, SVG to the OM, SVG to the diagonal, and SVG to the RCA - no chest pain. No SOB/DOE - compliant with meds   2. HTN - compliant with meds - some dizziness at times.      3. Hyperlipidemia - labs followed by pcp  - he is on atorvastatin  4. Carotid stenosis - LICA 100% occlusion -Carotid Dopplers on 09/20/2018 showed chronic occlusion of the left internal carotid artery with mild, 1 to 49% stenosis of the proximal right internal carotid artery.  - no recent neuro symptoms   5. Renal artery stenosis - has had prior left renal artery stent     6. Aflutter - denies any palpitatoins - no bleeding on xarelto.    7. PAD - prior right popliteal intervention - no recent claudication pains   Past Medical History:  Diagnosis Date   Chronic anticoagulation    Chronic renal insufficiency, stage III (moderate) (HCC)    Coronary artery disease 2010   CABG   Dyslipidemia    Hypertension 12/26/09   renal doppler   Hypothyroidism    PAF (paroxysmal atrial fibrillation) (HCC)    Peripheral vascular disease (HCC) 08/01/10   Carotid doppler: 12/26/09 lower arterial doppler   RBBB    Shingles April 2012     No Known Allergies   Current Outpatient Medications  Medication Sig Dispense Refill   amLODipine (NORVASC) 5 MG tablet TAKE (1) TABLET BY MOUTH ONCE DAILY. 90 tablet 3   atorvastatin (LIPITOR) 20 MG tablet TAKE (1) TABLET BY MOUTH ONCE DAILY. 30 tablet 6   cilostazol (PLETAL) 50 MG tablet TAKE (2) TABLETS BY MOUTH TWICE DAILY. 60 tablet 0   escitalopram (LEXAPRO) 10 MG tablet Take 10 mg by mouth daily.     glimepiride (AMARYL) 1 MG tablet Take 1 mg by mouth at bedtime.  (Patient not taking: Reported on 12/07/2019)     levothyroxine (SYNTHROID) 112 MCG tablet Take 112 mcg by mouth daily before breakfast.       losartan-hydrochlorothiazide (HYZAAR) 50-12.5 MG tablet TAKE (1) TABLET BY MOUTH ONCE DAILY. 30 tablet 6   metoprolol tartrate (LOPRESSOR) 100 MG tablet TAKE (1) TABLET BY MOUTH TWICE DAILY. 180 tablet 3   pioglitazone (ACTOS) 15 MG tablet Take 15 mg by mouth daily.      Rivaroxaban (XARELTO) 15 MG TABS tablet TAKE ONE TABLET BY MOUTH ONCE DAILY WITH SUPPER. 30 tablet 11   No current facility-administered medications for this visit.     Past Surgical History:  Procedure Laterality Date   CARDIAC CATHETERIZATION  6/10   CORONARY ARTERY BYPASS GRAFT  2010   3-vessel by-pass   HERNIA REPAIR  3/09   POPLITEAL ARTERY ANGIOPLASTY Right    PROSTATE SURGERY  11/99   RENAL ARTERY ANGIOPLASTY Left 9/11     No Known Allergies    Family History  Problem Relation Age of Onset   CVA Mother 93   Stroke Mother    Diabetes Father    CAD Brother      Social History Mr. Stephens reports that he quit smoking about 12 years ago. His smoking use included cigarettes. He smoked an average of 1 pack per day. He has never used smokeless tobacco. Mr. Berkowitz reports no history of alcohol use.  Review of Systems CONSTITUTIONAL: No weight loss, fever, chills, weakness or fatigue.  HEENT: Eyes: No visual loss, blurred vision, double vision or yellow sclerae.No hearing loss, sneezing, congestion, runny nose or sore throat.  SKIN: No rash or itching.  CARDIOVASCULAR: per hpi RESPIRATORY: No shortness of breath, cough or sputum.  GASTROINTESTINAL: No anorexia, nausea, vomiting or diarrhea. No abdominal pain or blood.  GENITOURINARY: No burning on urination, no polyuria NEUROLOGICAL: dizziness at times MUSCULOSKELETAL: No muscle, back pain, joint pain or stiffness.  LYMPHATICS: No enlarged nodes. No history of splenectomy.  PSYCHIATRIC: No history of depression or anxiety.  ENDOCRINOLOGIC: No reports of sweating, cold or heat intolerance. No polyuria or polydipsia.  [name]   Physical Examination Today's  Vitals   10/15/20 1352  BP: (!) 96/58  Pulse: (!) 103  SpO2: 97%  Weight: 156 lb (70.8 kg)  Height: 5\' 11"  (1.803 m)   Body mass index is 21.76 kg/m.  Gen: resting comfortably, no acute distress HEENT: no scleral icterus, pupils equal round and reactive, no palptable cervical adenopathy,  CV: irreg, no m/r,g, no jvd Resp: Clear to auscultation bilaterally GI: abdomen is soft, non-tender, non-distended, normal bowel sounds, no hepatosplenomegaly MSK: extremities are warm, no edema.  Skin: warm, no rash Neuro:  no focal deficits Psych: appropriate affect   Diagnostic Studies  09/2018 carotid 10/2018 IMPRESSION: 1. Chronic occlusion of the left internal carotid artery. 2. Mild (1-49%) stenosis proximal right internal carotid artery secondary to mild heterogeneous atherosclerotic plaque. 3. Nonvisualization of the right vertebral artery which may be chronically occluded or congenitally hypoplastic. 4. Patent dominant left vertebral artery with normal antegrade flow.     05/2012 echo Study Conclusions   - Left ventricle: The cavity size was normal. There was mild    focal basal hypertrophy of the septum. Systolic function    was normal. The estimated ejection fraction was in the    range of 55% to 60%. Doppler parameters are consistent    with abnormal left ventricular relaxation (grade 1    diastolic dysfunction). The E/e' ratio is <10, suggesting    normal LV filling pressure.  - Aortic valve: Sclerosis without stenosis. Trivial    regurgitation.  - Mitral valve: Mildly thickened anterior mitral leaflet    with late systolic prolapse. Mild regurgitation. Valve    area by pressure half-time: 1.65cm^2.  - Left atrium: LA Volume/ BSA = 29.7 ml/m2 The atrium was    mildly dilated.  - Atrial septum: No defect or patent foramen ovale was    identified.  - Pulmonary arteries: PA peak pressure: 38mm Hg (S).  - Inferior vena cava: The vessel was normal in size; the     respirophasic diameter changes were in the normal range (=    50%); findings are consistent with normal central venous    pressure.    Assessment and Plan  1. CAD - denies symptoms, continue current meds  2. HTN - soft bp's, some dizziness at times. We will d/c his norvasc   3. Hyperlipdiemia - continue atorvastatin, request pcp labs   4. Aflutter -- denies symptoms - EKG today shows aflutter in 90s. On high dose lopressor, continue at this time - continue xarlelto      36m, M.D.

## 2020-10-15 NOTE — Patient Instructions (Signed)
Medication Instructions:  Your physician has recommended you make the following change in your medication:  STOP Norvasc 5 mg tablets daily  *If you need a refill on your cardiac medications before your next appointment, please call your pharmacy*   Lab Work: We have requested your most recent lab work from your primary care physician  If you have labs (blood work) drawn today and your tests are completely normal, you will receive your results only by: MyChart Message (if you have MyChart) OR A paper copy in the mail If you have any lab test that is abnormal or we need to change your treatment, we will call you to review the results.   Testing/Procedures: Your physician has requested that you have a carotid duplex. This test is an ultrasound of the carotid arteries in your neck. It looks at blood flow through these arteries that supply the brain with blood. Allow one hour for this exam. There are no restrictions or special instructions.    Follow-Up: At [facility], you and your health needs are our priority.  As part of our continuing mission to provide you with exceptional heart care, we have created designated Provider Care Teams.  These Care Teams include your primary Cardiologist (physician) and Advanced Practice Providers (APPs -  Physician Assistants and Nurse Practitioners) who all work together to provide you with the care you need, when you need it.  We recommend signing up for the patient portal called "MyChart".  Sign up information is provided on this After Visit Summary.  MyChart is used to connect with patients for Virtual Visits (Telemedicine).  Patients are able to view lab/test results, encounter notes, upcoming appointments, etc.  Non-urgent messages can be sent to your provider as well.   To learn more about what you can do with MyChart, go to [email or web address].    Your next appointment:   6 month(s)  The format for your next appointment:   In  Person  Provider:   Dina Rich, MD   Other Instructions

## 2020-10-20 ENCOUNTER — Other Ambulatory Visit: Payer: Self-pay | Admitting: Cardiology

## 2020-10-20 NOTE — Telephone Encounter (Signed)
Prescription refill request for Xarelto received.  Indication: A Flutter Last office visit: 10/15/20  [name] MD Weight: 70.8kg Age: 87 Scr: 1.93 CrCl: 25.98  Based on above findings Xarelto 15mg  daily (renal) is the appropriate dose.  Refill approved.

## 2020-10-21 DIAGNOSIS — E119 Type 2 diabetes mellitus without complications: Secondary | ICD-10-CM | POA: Diagnosis not present

## 2020-10-21 DIAGNOSIS — I10 Essential (primary) hypertension: Secondary | ICD-10-CM | POA: Diagnosis not present

## 2020-10-21 DIAGNOSIS — Z6822 Body mass index (BMI) 22.0-22.9, adult: Secondary | ICD-10-CM | POA: Diagnosis not present

## 2020-10-24 ENCOUNTER — Other Ambulatory Visit: Payer: Self-pay

## 2020-10-24 ENCOUNTER — Ambulatory Visit (HOSPITAL_COMMUNITY)
Admission: RE | Admit: 2020-10-24 | Discharge: 2020-10-24 | Disposition: A | Discharge status: Home / Self Care | Payer: Medicare Other | Source: Ambulatory Visit | Attending: Cardiology | Admitting: Cardiology

## 2020-10-24 DIAGNOSIS — I6523 Occlusion and stenosis of bilateral carotid arteries: Secondary | ICD-10-CM | POA: Diagnosis not present

## 2020-10-24 DIAGNOSIS — I2510 Atherosclerotic heart disease of native coronary artery without angina pectoris: Secondary | ICD-10-CM | POA: Diagnosis not present

## 2020-10-30 ENCOUNTER — Other Ambulatory Visit: Payer: Self-pay | Admitting: Cardiology

## 2020-12-15 ENCOUNTER — Other Ambulatory Visit: Payer: Self-pay | Admitting: Student

## 2020-12-17 DIAGNOSIS — J22 Unspecified acute lower respiratory infection: Secondary | ICD-10-CM | POA: Diagnosis not present

## 2020-12-17 DIAGNOSIS — Z1331 Encounter for screening for depression: Secondary | ICD-10-CM | POA: Diagnosis not present

## 2020-12-17 DIAGNOSIS — Z6822 Body mass index (BMI) 22.0-22.9, adult: Secondary | ICD-10-CM | POA: Diagnosis not present

## 2021-04-01 ENCOUNTER — Other Ambulatory Visit: Payer: Self-pay | Admitting: Cardiology

## 2021-04-01 NOTE — Telephone Encounter (Signed)
Prescription refill request for Xarelto received.  Indication: Atrial flutter Last office visit: 10/15/20  [name] MD Weight: 70.8kg Age: 88 Scr: 1.93 on 01/16/20 CrCl: 25.98  Based on above findings Xarelto 15mg  daily is the appropriate dose.  Patient is PAST DUE for Labs and is due for OV.  Message sent to [facility] scheduler to appt  with MD and Order CBC/BMP at that time.  Refill approved x 1

## 2021-04-14 DIAGNOSIS — J22 Unspecified acute lower respiratory infection: Secondary | ICD-10-CM | POA: Diagnosis not present

## 2021-04-14 DIAGNOSIS — E663 Overweight: Secondary | ICD-10-CM | POA: Diagnosis not present

## 2021-04-14 DIAGNOSIS — L03039 Cellulitis of unspecified toe: Secondary | ICD-10-CM | POA: Diagnosis not present

## 2021-04-14 DIAGNOSIS — R0602 Shortness of breath: Secondary | ICD-10-CM | POA: Diagnosis not present

## 2021-04-14 DIAGNOSIS — Z6822 Body mass index (BMI) 22.0-22.9, adult: Secondary | ICD-10-CM | POA: Diagnosis not present

## 2021-04-14 DIAGNOSIS — I4892 Unspecified atrial flutter: Secondary | ICD-10-CM | POA: Diagnosis not present

## 2021-05-14 ENCOUNTER — Other Ambulatory Visit: Payer: Self-pay | Admitting: Cardiology

## 2021-05-19 ENCOUNTER — Other Ambulatory Visit: Payer: Self-pay

## 2021-05-19 ENCOUNTER — Emergency Department (HOSPITAL_COMMUNITY)
Admission: EM | Admit: 2021-05-19 | Discharge: 2021-05-19 | Disposition: A | Discharge status: Home / Self Care | Payer: Medicare Other | Attending: Emergency Medicine | Admitting: Emergency Medicine

## 2021-05-19 ENCOUNTER — Emergency Department (HOSPITAL_COMMUNITY): Payer: Medicare Other

## 2021-05-19 ENCOUNTER — Encounter (HOSPITAL_COMMUNITY): Payer: Self-pay | Admitting: *Deleted

## 2021-05-19 DIAGNOSIS — Z4800 Encounter for change or removal of nonsurgical wound dressing: Secondary | ICD-10-CM | POA: Insufficient documentation

## 2021-05-19 DIAGNOSIS — Z951 Presence of aortocoronary bypass graft: Secondary | ICD-10-CM | POA: Insufficient documentation

## 2021-05-19 DIAGNOSIS — M79672 Pain in left foot: Secondary | ICD-10-CM | POA: Diagnosis not present

## 2021-05-19 DIAGNOSIS — S91105A Unspecified open wound of left lesser toe(s) without damage to nail, initial encounter: Secondary | ICD-10-CM | POA: Diagnosis not present

## 2021-05-19 DIAGNOSIS — R7989 Other specified abnormal findings of blood chemistry: Secondary | ICD-10-CM | POA: Diagnosis not present

## 2021-05-19 DIAGNOSIS — Z1331 Encounter for screening for depression: Secondary | ICD-10-CM | POA: Diagnosis not present

## 2021-05-19 DIAGNOSIS — D649 Anemia, unspecified: Secondary | ICD-10-CM | POA: Diagnosis not present

## 2021-05-19 DIAGNOSIS — E1165 Type 2 diabetes mellitus with hyperglycemia: Secondary | ICD-10-CM | POA: Insufficient documentation

## 2021-05-19 DIAGNOSIS — M79675 Pain in left toe(s): Secondary | ICD-10-CM | POA: Diagnosis present

## 2021-05-19 DIAGNOSIS — L03039 Cellulitis of unspecified toe: Secondary | ICD-10-CM | POA: Diagnosis not present

## 2021-05-19 DIAGNOSIS — R600 Localized edema: Secondary | ICD-10-CM | POA: Diagnosis not present

## 2021-05-19 DIAGNOSIS — Z7901 Long term (current) use of anticoagulants: Secondary | ICD-10-CM | POA: Diagnosis not present

## 2021-05-19 DIAGNOSIS — E0821 Diabetes mellitus due to underlying condition with diabetic nephropathy: Secondary | ICD-10-CM | POA: Diagnosis not present

## 2021-05-19 DIAGNOSIS — I2510 Atherosclerotic heart disease of native coronary artery without angina pectoris: Secondary | ICD-10-CM | POA: Diagnosis not present

## 2021-05-19 DIAGNOSIS — Z0001 Encounter for general adult medical examination with abnormal findings: Secondary | ICD-10-CM | POA: Diagnosis not present

## 2021-05-19 DIAGNOSIS — Z6822 Body mass index (BMI) 22.0-22.9, adult: Secondary | ICD-10-CM | POA: Diagnosis not present

## 2021-05-19 DIAGNOSIS — Z5189 Encounter for other specified aftercare: Secondary | ICD-10-CM

## 2021-05-19 DIAGNOSIS — E119 Type 2 diabetes mellitus without complications: Secondary | ICD-10-CM | POA: Diagnosis not present

## 2021-05-19 LAB — CBC WITH DIFFERENTIAL/PLATELET
Abs Immature Granulocytes: 0.02 10*3/uL (ref 0.00–0.07)
Basophils Absolute: 0 10*3/uL (ref 0.0–0.1)
Basophils Relative: 1 %
Eosinophils Absolute: 0.1 10*3/uL (ref 0.0–0.5)
Eosinophils Relative: 2 %
HCT: 25.6 % — ABNORMAL LOW (ref 39.0–52.0)
Hemoglobin: 8.4 g/dL — ABNORMAL LOW (ref 13.0–17.0)
Immature Granulocytes: 0 %
Lymphocytes Relative: 10 %
Lymphs Abs: 0.6 10*3/uL — ABNORMAL LOW (ref 0.7–4.0)
MCH: 32.9 pg (ref 26.0–34.0)
MCHC: 32.8 g/dL (ref 30.0–36.0)
MCV: 100.4 fL — ABNORMAL HIGH (ref 80.0–100.0)
Monocytes Absolute: 0.4 10*3/uL (ref 0.1–1.0)
Monocytes Relative: 7 %
Neutro Abs: 4.4 10*3/uL (ref 1.7–7.7)
Neutrophils Relative %: 80 %
Platelets: 167 10*3/uL (ref 150–400)
RBC: 2.55 MIL/uL — ABNORMAL LOW (ref 4.22–5.81)
RDW: 14.8 % (ref 11.5–15.5)
WBC: 5.5 10*3/uL (ref 4.0–10.5)
nRBC: 0 % (ref 0.0–0.2)

## 2021-05-19 LAB — COMPREHENSIVE METABOLIC PANEL
ALT: 11 U/L (ref 0–44)
AST: 15 U/L (ref 15–41)
Albumin: 3.4 g/dL — ABNORMAL LOW (ref 3.5–5.0)
Alkaline Phosphatase: 46 U/L (ref 38–126)
Anion gap: 6 (ref 5–15)
BUN: 39 mg/dL — ABNORMAL HIGH (ref 8–23)
CO2: 24 mmol/L (ref 22–32)
Calcium: 8.8 mg/dL — ABNORMAL LOW (ref 8.9–10.3)
Chloride: 105 mmol/L (ref 98–111)
Creatinine, Ser: 1.94 mg/dL — ABNORMAL HIGH (ref 0.61–1.24)
GFR, Estimated: 32 mL/min — ABNORMAL LOW (ref >60)
Glucose, Bld: 128 mg/dL — ABNORMAL HIGH (ref 70–99)
Potassium: 4.4 mmol/L (ref 3.5–5.1)
Sodium: 135 mmol/L (ref 135–145)
Total Bilirubin: 0.6 mg/dL (ref 0.3–1.2)
Total Protein: 6.5 g/dL (ref 6.5–8.1)

## 2021-05-19 LAB — LACTIC ACID, PLASMA: Lactic Acid, Venous: 1.3 mmol/L (ref 0.5–1.9)

## 2021-05-19 MED ORDER — SODIUM CHLORIDE 0.9 % IV BOLUS
1000.0000 mL | Freq: Once | INTRAVENOUS | Status: AC
Start: 2021-05-19 — End: 2021-05-19
  Administered 2021-05-19: 1000 mL via INTRAVENOUS

## 2021-05-19 MED ORDER — CIPROFLOXACIN HCL 500 MG PO TABS: 500.0000 mg | ORAL_TABLET | Freq: Every day | ORAL | 0 refills | Status: AC

## 2021-05-19 MED ORDER — DOXYCYCLINE HYCLATE 100 MG PO CAPS: 100.0000 mg | ORAL_CAPSULE | Freq: Two times a day (BID) | ORAL | 0 refills | Status: AC

## 2021-05-19 NOTE — ED Notes (Addendum)
Occult blood card completed at bedside with provider. Occult blood negative.  ?

## 2021-05-19 NOTE — ED Triage Notes (Signed)
Pain in left 2nd toe, referral by pcp ?

## 2021-05-19 NOTE — ED Provider Notes (Signed)
?[place] EMERGENCY DEPARTMENT ?Provider Note ? ? ?CSN: [id number] ?Arrival date & time: 05/19/21  1126 ? ?  ? ?History ? ?Chief Complaint  ?Patient presents with  ? Toe Pain  ? ? ?Scott [name] is a 88 y.o. male. ? ?HPI ? ?Patient with medical history including CAD, CABG 2010, A-fib currently on Xarelto, peripheral vascular disease secondary due to carotid stent stenosis diabetic but currently not on diabetic medication presents to the emergency department with complaints of a left foot infection.  Patient states that he has a infection on his left second digit.  He states that while he was cutting his nails he cut the nail too close and nicked the skin on that toe.  He states he start develop infection, he went to his primary care doctor who started him on Bactrim.  This was 1 month ago, he states that the infection got better but started come back, states over last 2 weeks he has had continued redness and swelling around the area but denies any drainage or discharge, he states he has occasional paresthesias and pain in that foot but has not gotten any worse.  He states he went to his primary care doctor today and he was told to come here for further evaluation he denies any fevers chills general body aches no calf tenderness or leg swelling he has not been on any antibiotics. ? ?I reviewed patient's chart he is followed by Dr. Loreta Ave but I am unable to see his records as a normal part of epic.  I reviewed his chart he is followed by Dr. Wyline Mood and has  PVD of the carotid arteries not within the extremities. ? ?Home Medications ?Prior to Admission medications   ?Medication Sig Start Date End Date Taking? Authorizing Provider  ?cilostazol (PLETAL) 50 MG tablet TAKE (2) TABLETS BY MOUTH TWICE DAILY. 05/14/21   Antoine Poche, MD  ?ciprofloxacin (CIPRO) 500 MG tablet Take 1 tablet (500 mg total) by mouth daily for 10 days. 05/19/21 05/29/21 Yes Carroll Sage, PA-C  ?doxycycline (VIBRAMYCIN) 100 MG capsule Take 1  capsule (100 mg total) by mouth 2 (two) times daily for 7 days. 05/19/21 05/26/21 Yes Carroll Sage, PA-C  ?atorvastatin (LIPITOR) 20 MG tablet TAKE (1) TABLET BY MOUTH ONCE DAILY. 02/07/19   Laqueta Linden, MD  ?escitalopram (LEXAPRO) 10 MG tablet Take 10 mg by mouth daily.    [provider]  ?glimepiride (AMARYL) 1 MG tablet Take 1 mg by mouth at bedtime. 04/11/17   [provider]  ?levothyroxine (SYNTHROID) 112 MCG tablet Take 112 mcg by mouth daily before breakfast.  08/18/18   [provider]  ?losartan-hydrochlorothiazide (HYZAAR) 50-12.5 MG tablet TAKE (1) TABLET BY MOUTH ONCE DAILY. 04/01/21   Antoine Poche, MD  ?metoprolol tartrate (LOPRESSOR) 100 MG tablet TAKE (1) TABLET BY MOUTH TWICE DAILY. 12/15/20   Antoine Poche, MD  ?pioglitazone (ACTOS) 15 MG tablet Take 15 mg by mouth daily.  07/11/18   [provider]  ?Rivaroxaban (XARELTO) 15 MG TABS tablet TAKE ONE TABLET BY MOUTH ONCE DAILY WITH SUPPER. 04/01/21   Antoine Poche, MD  ?   ? ?Allergies    ?Patient has no known allergies.   ? ?Review of Systems   ?Review of Systems  ?Constitutional:  Negative for chills and fever.  ?Respiratory:  Negative for shortness of breath.   ?Cardiovascular:  Negative for chest pain.  ?Gastrointestinal:  Negative for abdominal pain.  ?Skin:  Positive  for wound.  ?Neurological:  Negative for headaches.  ? ?Physical Exam ?Updated Vital Signs ?BP (!) 142/74   Pulse (!) 105   Temp 99.6 ?F (37.6 ?C) (Rectal)   Resp 18   Ht 5\' 10"  (1.778 m)   Wt 72.6 kg   SpO2 97%   BMI 22.96 kg/m?  ?Physical Exam ?Vitals and nursing note reviewed.  ?Constitutional:   ?   General: He is not in acute distress. ?   Appearance: He is not ill-appearing.  ?HENT:  ?   Head: Normocephalic and atraumatic.  ?   Nose: No congestion.  ?Eyes:  ?   Conjunctiva/sclera: Conjunctivae normal.  ?Cardiovascular:  ?   Rate and Rhythm: Normal rate and regular rhythm.  ?   Pulses: Normal pulses.  ?   Heart  sounds: No murmur heard. ?  No friction rub. No gallop.  ?Pulmonary:  ?   Effort: No respiratory distress.  ?   Breath sounds: No wheezing, rhonchi or rales.  ?Musculoskeletal:  ?   Right lower leg: No edema.  ?   Left lower leg: Edema present.  ?   Comments: Patient's left foot was visualized she has noted wound on the second digit on the lateral distal aspect, he has some rounding erythema redness moving to MCP joints of the first 3 digits, but there is no obvious streaking, he has slight erythema on the dorsal aspect of his foot he has 2+ dorsal pedal pulses bilaterally neurovascular fully intact.  No calf tenderness no palpable cords please see picture for full detail  ?Skin: ?   General: Skin is warm and dry.  ?Neurological:  ?   Mental Status: He is alert.  ?Psychiatric:     ?   Mood and Affect: Mood normal.  ? ? ? ? ? ? ? ? ? ?ED Results / Procedures / Treatments   ?Labs ?(all labs ordered are listed, but only abnormal results are displayed) ?Labs Reviewed  ?COMPREHENSIVE METABOLIC PANEL - Abnormal; Notable for the following components:  ?    Result Value  ? Glucose, Bld 128 (*)   ? BUN 39 (*)   ? Creatinine, Ser 1.94 (*)   ? Calcium 8.8 (*)   ? Albumin 3.4 (*)   ? GFR, Estimated 32 (*)   ? All other components within normal limits  ?CBC WITH DIFFERENTIAL/PLATELET - Abnormal; Notable for the following components:  ? RBC 2.55 (*)   ? Hemoglobin 8.4 (*)   ? HCT 25.6 (*)   ? MCV 100.4 (*)   ? Lymphs Abs 0.6 (*)   ? All other components within normal limits  ?CULTURE, BLOOD (ROUTINE X 2)  ?CULTURE, BLOOD (ROUTINE X 2)  ?LACTIC ACID, PLASMA  ?OCCULT BLOOD X 1 CARD TO LAB, STOOL  ? ? ?EKG ?None ? ?Radiology ?DG Foot Complete Left ? ?Result Date: 05/19/2021 ?CLINICAL DATA:  Second toe wound for the past month. EXAM: LEFT FOOT - COMPLETE 3+ VIEW COMPARISON:  None. FINDINGS: No acute fracture or dislocation. No bony destruction or periosteal reaction. 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 Bone  mineralization is normal. Soft tissues are unremarkable. IMPRESSION: 1. No acute osseous abnormality. Electronically Signed   By: Titus Dubin M.D.   On: 05/19/2021 12:37   ? ?Procedures ?Procedures  ? ? ?Medications Ordered in ED ?Medications  ?sodium chloride 0.9 % bolus 1,000 mL (1,000 mLs Intravenous New Bag/Given 05/19/21 1332)  ? ? ?ED Course/ Medical Decision Making/ A&P ?  ?                        ?  Medical Decision Making ?Amount and/or Complexity of Data Reviewed ?Labs: ordered. ?Radiology: ordered. ? ? ?This patient presents to the ED for concern of wound check, this involves an extensive number of treatment options, and is a complaint that carries with it a high risk of complications and morbidity.  The differential diagnosis includes DVT, limb ischemia, sepsis, osteomyelitis ? ? ? ?Additional history obtained: ? ?Additional history obtained from previous cardiology notes ?External records from outside source obtained and reviewed including please see HPI ? ? ?Co morbidities that complicate the patient evaluation ? ?Diabetes, anticoag use ? ?Social Determinants of Health: ? ?Geriatric ? ? ? ?Lab Tests: ? ?I Ordered, and personally interpreted labs.  The pertinent results include: CBC shows macrocytic anemia hemoglobin 8.4, CMP shows glucose of 128 BUN 39 creatinine 1.94 slightly above average for him, GFR 32 lactic 1.3 blood cultures pending ? ? ?Imaging Studies ordered: ? ?I ordered imaging studies including DG of left foot ?I independently visualized and interpreted imaging which showed negative for signs of osteomyelitis ?I agree with the radiologist interpretation ? ? ?Cardiac Monitoring: ? ?The patient was maintained on a cardiac monitor.  I personally viewed and interpreted the cardiac monitored which showed an underlying rhythm of: N/A ? ? ?Medicines ordered and prescription drug management: ? ?I ordered medication including fluids for dehydration ?I have reviewed the patients home medicines and  have made adjustments as needed ? ?Critical Interventions: ? ?N/A ? ? ?Reevaluation: ? ?Presents with obvious infection of the left second digit, concerning for possible osteomyelitis, will obtain imaging, bas

## 2021-05-19 NOTE — Discharge Instructions (Signed)
Foot infection-appears that you have a infection on your left toe, started on antibiotics please take as prescribed.  I recommend keeping the area clean you may continue to soak it and dried off and then place a Band-Aid over it.  We have drawn a line at the end of your foot if you notice after 3 to 4 days of using antibiotics the redness goes past that line I like to come back in for reevaluation or for worsening pain fevers chills you must be reassessed.  I would like to follow-up with a foot doctor either the one in [place] or the one I provided you above within the next 1 to 2 weeks. ?Anemia-your hemoglobin was slightly low today, I am unable to see the medical records from your primary care doctor I like to follow-up for reevaluation of your hemoglobin to ensure this is not getting lower. ? ?Come back to the emergency department if you develop chest pain, shortness of breath, severe abdominal pain, uncontrolled nausea, vomiting, diarrhea. ? ?

## 2021-05-24 LAB — CULTURE, BLOOD (ROUTINE X 2)
Culture: NO GROWTH
Culture: NO GROWTH
Special Requests: ADEQUATE

## 2021-06-02 ENCOUNTER — Other Ambulatory Visit: Payer: Self-pay | Admitting: Podiatry

## 2021-06-02 ENCOUNTER — Other Ambulatory Visit (HOSPITAL_COMMUNITY): Payer: Self-pay | Admitting: Podiatry

## 2021-06-02 DIAGNOSIS — I739 Peripheral vascular disease, unspecified: Secondary | ICD-10-CM | POA: Diagnosis not present

## 2021-06-02 DIAGNOSIS — E1151 Type 2 diabetes mellitus with diabetic peripheral angiopathy without gangrene: Secondary | ICD-10-CM | POA: Diagnosis not present

## 2021-06-02 DIAGNOSIS — S91105A Unspecified open wound of left lesser toe(s) without damage to nail, initial encounter: Secondary | ICD-10-CM | POA: Diagnosis not present

## 2021-06-03 ENCOUNTER — Ambulatory Visit (HOSPITAL_COMMUNITY)
Admission: RE | Admit: 2021-06-03 | Discharge: 2021-06-03 | Disposition: A | Discharge status: Home / Self Care | Payer: Medicare Other | Source: Ambulatory Visit | Attending: Podiatry | Admitting: Podiatry

## 2021-06-03 DIAGNOSIS — I739 Peripheral vascular disease, unspecified: Secondary | ICD-10-CM | POA: Diagnosis not present

## 2021-06-03 DIAGNOSIS — S91105A Unspecified open wound of left lesser toe(s) without damage to nail, initial encounter: Secondary | ICD-10-CM | POA: Insufficient documentation

## 2021-06-03 DIAGNOSIS — I743 Embolism and thrombosis of arteries of the lower extremities: Secondary | ICD-10-CM | POA: Diagnosis not present

## 2021-06-08 NOTE — Progress Notes (Signed)
? ?Cardiology Office Note   ? ?Date:  06/15/2021  ? ?ID:  Scott [name], DOB 1929-12-01, MRN [id number] ? ? ?PCP:  Sharilyn Sites, MD ?  ?[place]  ?Cardiologist:  Carlyle Dolly, MD   ?Advanced Practice Provider:  No care team member to display ?Electrophysiologist:  None  ? ?[id number]  ? ?Chief Complaint  ?Patient presents with  ? Follow-up  ? ? ?History of Present Illness:  ?Scott MOHIUDDIN is a 88 y.o. male with history of CAD status post CABG 2010 with LIMA to the LAD, SVG to the OM, SVG to the diagonal and SVG to the RCA, hypertension, HLD, carotid stenosis, renal artery stenosis status post left renal artery stent, atrial flutter on Xarelto, PAD with right popliteal intervention in the past. ? ?Patient last saw Dr. Harl Bowie 08/14/2020 and was doing well. ? ?Patient had left lower ext arteriogram for critical limb ischemia 06/11/21 and will need left leg amputations vx palliative wound care.  ? ?Patient comes in with his son. He's having some pain in his left leg. Doesn't want to have an amputations. Denies chest pain or shortness of breath, . Lives with his wife and sons help as well.  Hbg came up from 84 to 11.2. BP running low. Only taking 1/2 of losartan and HCTZ.  HR usually in the 80's at home.  ? ? ? ?Past Medical History:  ?Diagnosis Date  ? Chronic anticoagulation   ? Chronic renal insufficiency, stage III (moderate) (HCC)   ? Coronary artery disease 2010  ? CABG  ? Dyslipidemia   ? Hypertension 12/26/09  ? renal doppler  ? Hypothyroidism   ? PAF (paroxysmal atrial fibrillation) ([place])   ? Peripheral vascular disease ([place]) 08/01/10  ? Carotid doppler: 12/26/09 lower arterial doppler  ? RBBB   ? Shingles April 2012  ? ? ?Past Surgical History:  ?Procedure Laterality Date  ? ABDOMINAL AORTOGRAM W/LOWER EXTREMITY N/A 06/11/2021  ? Procedure: ABDOMINAL AORTOGRAM W/LOWER EXTREMITY;  Surgeon: Marty Heck, MD;  Location: [facility];  Service: Cardiovascular;  Laterality: N/A;   ? CARDIAC CATHETERIZATION  6/10  ? CORONARY ARTERY BYPASS GRAFT  2010  ? 3-vessel by-pass  ? HERNIA REPAIR  3/09  ? POPLITEAL ARTERY ANGIOPLASTY Right   ? PROSTATE SURGERY  11/99  ? RENAL ARTERY ANGIOPLASTY Left 9/11  ? ? ?Current Medications: ?Current Meds  ?Medication Sig  ? acetaminophen (TYLENOL) 500 MG tablet Take 500-1,000 mg by mouth 3 (three) times daily as needed (pain.).  ? atorvastatin (LIPITOR) 20 MG tablet Take 1 tablet (20 mg total) by mouth daily.  ? Cholecalciferol (VITAMIN D3 PO) Take 1 tablet by mouth in the morning.  ? cilostazol (PLETAL) 50 MG tablet TAKE (2) TABLETS BY MOUTH TWICE DAILY.  ? escitalopram (LEXAPRO) 10 MG tablet Take 10 mg by mouth in the morning.  ? glimepiride (AMARYL) 1 MG tablet Take 1 mg by mouth in the morning.  ? HYDROcodone-acetaminophen (NORCO/VICODIN) 5-325 MG tablet Take 1 tablet by mouth every 6 (six) hours as needed for moderate pain.  ? levothyroxine (SYNTHROID) 112 MCG tablet Take 112 mcg by mouth daily before breakfast.   ? metoprolol tartrate (LOPRESSOR) 100 MG tablet TAKE (1) TABLET BY MOUTH TWICE DAILY.  ? pioglitazone (ACTOS) 15 MG tablet Take 15 mg by mouth every evening.  ? Rivaroxaban (XARELTO) 15 MG TABS tablet TAKE ONE TABLET BY MOUTH ONCE DAILY WITH SUPPER.  ? [DISCONTINUED] ciprofloxacin (CIPRO) 250 MG tablet Take  250 mg by mouth 2 (two) times daily.  ? [DISCONTINUED] losartan-hydrochlorothiazide (HYZAAR) 50-12.5 MG tablet TAKE (1) TABLET BY MOUTH ONCE DAILY.  ?  ? ?Allergies:   Patient has no known allergies.  ? ?Social History  ? ?Socioeconomic History  ? Marital status: Married  ?  Spouse name: Not on file  ? Number of children: 2  ? Years of education: Not on file  ? Highest education level: Not on file  ?Occupational History  ? Occupation: retired  ?Tobacco Use  ? Smoking status: Former  ?  Packs/day: 1.00  ?  Types: Cigarettes  ?  Quit date: 07/28/2008  ?  Years since quitting: 12.8  ? Smokeless tobacco: Never  ?Vaping Use  ? Vaping Use: Never used   ?Substance and Sexual Activity  ? Alcohol use: No  ?  Alcohol/week: 0.0 standard drinks  ? Drug use: Never  ? Sexual activity: Not on file  ?Other Topics Concern  ? Not on file  ?Social History Narrative  ? Not on file  ? ?Social Determinants of Health  ? ?Financial Resource Strain: Not on file  ?Food Insecurity: Not on file  ?Transportation Needs: Not on file  ?Physical Activity: Not on file  ?Stress: Not on file  ?Social Connections: Not on file  ?  ? ?Family History:  The patient's  family history includes CAD in his brother; CVA (age of onset: 65) in his mother; Diabetes in his father; Stroke in his mother.  ? ?ROS:   ?Please see the history of present illness.    ?ROS All other systems reviewed and are negative. ? ? ?PHYSICAL EXAM:   ?VS:  BP (!) 94/52   Pulse (!) 107   Ht 5\' 10"  (1.778 m)   Wt 146 lb 6.4 oz (66.4 kg)   SpO2 94%   BMI 21.01 kg/m?   ?Physical Exam  ?GEN: Well nourished, well developed, in no acute distress  ?HEENT: normal  ?Neck: no JVD, carotid bruits, or masses ?Cardiac:RRR; no murmurs, rubs, or gallops  ?Respiratory:  clear to auscultation bilaterally, normal work of breathing ?GI: soft, nontender, nondistended, + BS ?Ext: without cyanosis, clubbing, or edema, Good distal pulses bilaterally ?MS: no deformity or atrophy  ?Skin: warm and dry, no rash ?Neuro:  Alert and Oriented x 3, Strength and sensation are intact ?Psych: euthymic mood, full affect ? ?Wt Readings from Last 3 Encounters:  ?06/15/21 146 lb 6.4 oz (66.4 kg)  ?06/11/21 149 lb (67.6 kg)  ?06/10/21 147 lb 6.4 oz (66.9 kg)  ?  ? ? ?Studies/Labs Reviewed:  ? ?EKG:  EKG is not ordered today.    ? ?Recent Labs: ?05/19/2021: ALT 11; Platelets 167 ?06/11/2021: BUN 34; Creatinine, Ser 2.10; Hemoglobin 11.2; Potassium 3.7; Sodium 136  ? ?Lipid Panel ?   ?Component Value Date/Time  ? CHOL 85 (L) 08/02/2016 0840  ? TRIG 63 08/02/2016 0840  ? HDL 37 (L) 08/02/2016 0840  ? CHOLHDL 2.3 08/02/2016 0840  ? CHOLHDL 2.2 08/08/2013 0909  ? VLDL  10 08/08/2013 0909  ? [place] 35 08/02/2016 0840  ? ? ?Additional studies/ records that were reviewed today include:  ?09/2018 carotid US ?IMPRESSION: ?1. Chronic occlusion of the left internal carotid artery. ?2. Mild (1-49%) stenosis proximal right internal carotid artery ?secondary to mild heterogeneous atherosclerotic plaque. ?3. Nonvisualization of the right vertebral artery which may be ?chronically occluded or congenitally hypoplastic. ?4. Patent dominant left vertebral artery with normal antegrade flow. ?  ?  ?05/2012 echo ?Study  Conclusions  ? ?- Left ventricle: The cavity size was normal. There was mild  ?  focal basal hypertrophy of the septum. Systolic function  ?  was normal. The estimated ejection fraction was in the  ?  range of 55% to 60%. Doppler parameters are consistent  ?  with abnormal left ventricular relaxation (grade 1  ?  diastolic dysfunction). The E/e' ratio is <10, suggesting  ?  normal LV filling pressure.  ?- Aortic valve: Sclerosis without stenosis. Trivial  ?  regurgitation.  ?- Mitral valve: Mildly thickened anterior mitral leaflet  ?  with late systolic prolapse. Mild regurgitation. Valve  ?  area by pressure half-time: 1.65cm^2.  ?- Left atrium: LA Volume/ BSA = 29.7 ml/m2 The atrium was  ?  mildly dilated.  ?- Atrial septum: No defect or patent foramen ovale was  ?  identified.  ?- Pulmonary arteries: PA peak pressure: 23mm Hg (S).  ?- Inferior vena cava: The vessel was normal in size; the  ?  respirophasic diameter changes were in the normal range (=  ?  50%); findings are consistent with normal central venous  ?  pressure.  ?  ?  ? ? ?Risk Assessment/Calculations:   ? ?CHA2DS2-VASc Score = 4  ? This indicates a 4.8% annual risk of stroke. ?The patient's score is based upon: ?CHF History: 0 ?HTN History: 1 ?Diabetes History: 0 ?Stroke History: 0 ?Vascular Disease History: 1 ?Age Score: 2 ?Gender Score: 0 ?  ? ? ? ? ? ?ASSESSMENT:   ? ?1. Coronary artery disease involving coronary  bypass graft of native heart, unspecified whether angina present   ?2. Essential hypertension   ?3. Mixed hyperlipidemia   ?4. Bilateral carotid artery stenosis   ?5. Atrial flutter, unspecified type ([place])

## 2021-06-10 ENCOUNTER — Ambulatory Visit (INDEPENDENT_AMBULATORY_CARE_PROVIDER_SITE_OTHER): Payer: Medicare Other | Admitting: Vascular Surgery

## 2021-06-10 ENCOUNTER — Other Ambulatory Visit: Payer: Self-pay

## 2021-06-10 ENCOUNTER — Encounter: Payer: Self-pay | Admitting: Vascular Surgery

## 2021-06-10 VITALS — BP 78/46 | HR 102 | Temp 97.9°F | Resp 14 | Ht 70.5 in | Wt 147.4 lb

## 2021-06-10 DIAGNOSIS — I6523 Occlusion and stenosis of bilateral carotid arteries: Secondary | ICD-10-CM | POA: Diagnosis not present

## 2021-06-10 DIAGNOSIS — I70222 Atherosclerosis of native arteries of extremities with rest pain, left leg: Secondary | ICD-10-CM

## 2021-06-10 NOTE — Progress Notes (Signed)
? ? ?[facility] [place] ? ?Patient name: Scott [name] MRN: [id number] DOB: Nov 10, 1929 Sex: male ? ?REASON FOR CONSULT: Evaluation critical limb ischemia left foot ? ?HPI: ?Scott [name] is a 88 y.o. male, who is here today for evaluation.  He is here today with his son.  He has had over 1 month history of pain and tissue loss on his left second toe and also the tip of his left great toe.  He has been treated with several courses of antibiotics and has had persistent difficulty with pain and nonhealing.  He underwent noninvasive studies on 06/03/2021 revealing no audible flow in his left foot and ankle arm index of 0.63 on the right.  He is here today for further evaluation.  He is status post coronary artery bypass grafting in 2010 and had vein harvest in an open fashion from his ankle to his knee.  He has a remote history of attempted atherectomy of the right popliteal artery stenosis with subsequent occlusion of this by duplex.  He does have moderate renal insufficiency with his most recent creatinine of 1.9.  He has a severe rest pain and is unable to sleep at night due to pain in his left foot.  He is on chronic anticoagulation for atrial fibrillation. ? ?Past Medical History:  ?Diagnosis Date  ? Chronic anticoagulation   ? Chronic renal insufficiency, stage III (moderate) (HCC)   ? Coronary artery disease 2010  ? CABG  ? Dyslipidemia   ? Hypertension 12/26/09  ? renal doppler  ? Hypothyroidism   ? PAF (paroxysmal atrial fibrillation) ([place])   ? Peripheral vascular disease ([place]) 08/01/10  ? Carotid doppler: 12/26/09 lower arterial doppler  ? RBBB   ? Shingles April 2012  ? ? ?Family History  ?Problem Relation Age of Onset  ? CVA Mother 72  ? Stroke Mother   ? Diabetes Father   ? CAD Brother   ? ? ?SOCIAL HISTORY: ?Social History  ? ?Socioeconomic History  ? Marital status: Married  ?  Spouse name: Not on file  ? Number of children: 2  ? Years of education:  Not on file  ? Highest education level: Not on file  ?Occupational History  ? Occupation: retired  ?Tobacco Use  ? Smoking status: Former  ?  Packs/day: 1.00  ?  Types: Cigarettes  ?  Quit date: 07/28/2008  ?  Years since quitting: 12.8  ? Smokeless tobacco: Never  ?Vaping Use  ? Vaping Use: Never used  ?Substance and Sexual Activity  ? Alcohol use: No  ?  Alcohol/week: 0.0 standard drinks  ? Drug use: Never  ? Sexual activity: Not on file  ?Other Topics Concern  ? Not on file  ?Social History Narrative  ? Not on file  ? ?Social Determinants of Health  ? ?Financial Resource Strain: Not on file  ?Food Insecurity: Not on file  ?Transportation Needs: Not on file  ?Physical Activity: Not on file  ?Stress: Not on file  ?Social Connections: Not on file  ?Intimate Partner Violence: Not on file  ? ? ?No Known Allergies ? ?Current Outpatient Medications  ?Medication Sig Dispense Refill  ? atorvastatin (LIPITOR) 20 MG tablet TAKE (1) TABLET BY MOUTH ONCE DAILY. 30 tablet 6  ? cilostazol (PLETAL) 50 MG tablet TAKE (2) TABLETS BY MOUTH TWICE DAILY. 120 tablet 3  ? escitalopram (LEXAPRO) 10 MG tablet Take 10 mg by mouth daily.    ? glimepiride (AMARYL) 1 MG tablet  Take 1 mg by mouth at bedtime.    ? levothyroxine (SYNTHROID) 112 MCG tablet Take 112 mcg by mouth daily before breakfast.     ? losartan-hydrochlorothiazide (HYZAAR) 50-12.5 MG tablet TAKE (1) TABLET BY MOUTH ONCE DAILY. 90 tablet 1  ? metoprolol tartrate (LOPRESSOR) 100 MG tablet TAKE (1) TABLET BY MOUTH TWICE DAILY. 180 tablet 1  ? pioglitazone (ACTOS) 15 MG tablet Take 15 mg by mouth daily.     ? Rivaroxaban (XARELTO) 15 MG TABS tablet TAKE ONE TABLET BY MOUTH ONCE DAILY WITH SUPPER. 30 tablet 1  ? ?No current facility-administered medications for this visit.  ? ? ?REVIEW OF SYSTEMS:  ?[X]  denotes positive finding, [ ]  denotes negative finding ?Cardiac  Comments:  ?Chest pain or chest pressure:    ?Shortness of breath upon exertion:    ?Short of breath when lying  flat:    ?Irregular heart rhythm:    ?    ?Vascular    ?Pain in calf, thigh, or hip brought on by ambulation:    ?Pain in feet at night that wakes you up from your sleep:  x   ?Blood clot in your veins:    ?Leg swelling:     ?    ?Pulmonary    ?Oxygen at home:    ?Productive cough:     ?Wheezing:     ?    ?Neurologic    ?Sudden weakness in arms or legs:     ?Sudden numbness in arms or legs:     ?Sudden onset of difficulty speaking or slurred speech:    ?Temporary loss of vision in one eye:     ?Problems with dizziness:     ?    ?Gastrointestinal    ?Blood in stool:     ?Vomited blood:     ?    ?Genitourinary    ?Burning when urinating:     ?Blood in urine:    ?    ?Psychiatric    ?Major depression:     ?    ?Hematologic    ?Bleeding problems:    ?Problems with blood clotting too easily:    ?    ?Skin    ?Rashes or ulcers:    ?    ?Constitutional    ?Fever or chills:    ? ? ?PHYSICAL EXAM: ?Vitals:  ? 06/10/21 1026  ?BP: (!) 78/46  ?Pulse: (!) 102  ?Resp: 14  ?Temp: 97.9 ?F (36.6 ?C)  ?TempSrc: Temporal  ?SpO2: 93%  ?Weight: 147 lb 6.4 oz (66.9 kg)  ?Height: 5' 10.5" (1.791 m)  ? ? ?GENERAL: The patient is a well-nourished male, in no acute distress. The vital signs are documented above. ?CARDIOVASCULAR: I do not palpate a left femoral pulse.  He has a 2+ right femoral pulse.  He has absent popliteal and distal pulses bilaterally ?PULMONARY: There is good air exchange  ?MUSCULOSKELETAL: There are no major deformities or cyanosis. ?NEUROLOGIC: No focal weakness or paresthesias are detected. ?SKIN: He has severe ruborous changes in his left foot.  He has full-thickness loss of the distal half of his left second toe and also the ulceration on the tip of his great toe. ?PSYCHIATRIC: The patient has a normal affect. ? ?DATA:  ?Noninvasive studies reveal no audible flow in his left foot and ankle arm index of 0.63 on the right ? ?MEDICAL ISSUES: ?Critical limb ischemia left foot.  I discussed this at length with the  patient and his son present.  Feel that he is extremely high risk for amputation and would probably be at the above-knee level.  I have recommended urgent arteriography to determine revascularization options.  Explained that there is an outside chance that this could be done with endovascular treatment but in all likelihood would require open surgery.  Explained that with his renal insufficiency, we would minimize contrast as much as possible but would certainly be at risk for worsening renal function.  He understands that he is at very high risk for limb loss if intervention is not undertaken.  He has not taken his Xarelto today and we will hold it today.  He has been scheduled for arteriography and possible intervention at [facility] tomorrow with Dr. Monica Martinez.  I discussed this with Dr. Carlis Abbott by telephone who will coordinate ongoing care at [facility]. ? ? ?Rosetta Posner, MD FACS ?[facility] [place] ?Office Tel [phone] ?Pager [phone] ? ?Note: Portions of this report may have been transcribed using voice recognition software.  Every effort has been made to ensure accuracy; however, inadvertent computerized transcription errors may still be present. ? ?

## 2021-06-11 ENCOUNTER — Ambulatory Visit (HOSPITAL_COMMUNITY)
Admission: RE | Admit: 2021-06-11 | Discharge: 2021-06-11 | Disposition: A | Discharge status: Home / Self Care | Payer: Medicare Other | Attending: Vascular Surgery | Admitting: Vascular Surgery

## 2021-06-11 ENCOUNTER — Encounter (HOSPITAL_COMMUNITY)
Admission: RE | Disposition: A | Discharge status: Home / Self Care | Payer: Self-pay | Source: Home / Self Care | Attending: Vascular Surgery

## 2021-06-11 DIAGNOSIS — Z7901 Long term (current) use of anticoagulants: Secondary | ICD-10-CM | POA: Insufficient documentation

## 2021-06-11 DIAGNOSIS — Z87891 Personal history of nicotine dependence: Secondary | ICD-10-CM | POA: Diagnosis not present

## 2021-06-11 DIAGNOSIS — I480 Paroxysmal atrial fibrillation: Secondary | ICD-10-CM | POA: Diagnosis not present

## 2021-06-11 DIAGNOSIS — I70222 Atherosclerosis of native arteries of extremities with rest pain, left leg: Secondary | ICD-10-CM | POA: Insufficient documentation

## 2021-06-11 DIAGNOSIS — Z951 Presence of aortocoronary bypass graft: Secondary | ICD-10-CM | POA: Insufficient documentation

## 2021-06-11 DIAGNOSIS — I2510 Atherosclerotic heart disease of native coronary artery without angina pectoris: Secondary | ICD-10-CM | POA: Diagnosis not present

## 2021-06-11 DIAGNOSIS — I70249 Atherosclerosis of native arteries of left leg with ulceration of unspecified site: Secondary | ICD-10-CM | POA: Diagnosis not present

## 2021-06-11 HISTORY — PX: ABDOMINAL AORTOGRAM W/LOWER EXTREMITY: CATH118223

## 2021-06-11 LAB — POCT I-STAT, CHEM 8
BUN: 34 mg/dL — ABNORMAL HIGH (ref 8–23)
Calcium, Ion: 1.31 mmol/L (ref 1.15–1.40)
Chloride: 99 mmol/L (ref 98–111)
Creatinine, Ser: 2.1 mg/dL — ABNORMAL HIGH (ref 0.61–1.24)
Glucose, Bld: 155 mg/dL — ABNORMAL HIGH (ref 70–99)
HCT: 33 % — ABNORMAL LOW (ref 39.0–52.0)
Hemoglobin: 11.2 g/dL — ABNORMAL LOW (ref 13.0–17.0)
Potassium: 3.7 mmol/L (ref 3.5–5.1)
Sodium: 136 mmol/L (ref 135–145)
TCO2: 24 mmol/L (ref 22–32)

## 2021-06-11 LAB — GLUCOSE, CAPILLARY: Glucose-Capillary: 132 mg/dL — ABNORMAL HIGH (ref 70–99)

## 2021-06-11 SURGERY — ABDOMINAL AORTOGRAM W/LOWER EXTREMITY
Anesthesia: LOCAL

## 2021-06-11 MED ORDER — IODIXANOL 320 MG/ML IV SOLN
INTRAVENOUS | Status: DC | PRN
  Administered 2021-06-11: 30 mL via INTRA_ARTERIAL

## 2021-06-11 MED ORDER — SODIUM CHLORIDE 0.9% FLUSH: 3.0000 mL | INTRAVENOUS | Status: DC | PRN

## 2021-06-11 MED ORDER — SODIUM CHLORIDE 0.9% FLUSH: 3.0000 mL | Freq: Two times a day (BID) | INTRAVENOUS | Status: DC

## 2021-06-11 MED ORDER — HYDROCODONE-ACETAMINOPHEN 5-325 MG PO TABS: 1.0000 | ORAL_TABLET | Freq: Four times a day (QID) | ORAL | 0 refills | Status: DC | PRN

## 2021-06-11 MED ORDER — HEPARIN (PORCINE) IN NACL 1000-0.9 UT/500ML-% IV SOLN
INTRAVENOUS | Status: DC | PRN
  Administered 2021-06-11 (×2): 500 mL

## 2021-06-11 MED ORDER — HYDRALAZINE HCL 20 MG/ML IJ SOLN: 5.0000 mg | INTRAMUSCULAR | Status: DC | PRN

## 2021-06-11 MED ORDER — LABETALOL HCL 5 MG/ML IV SOLN: 10.0000 mg | INTRAVENOUS | Status: DC | PRN

## 2021-06-11 MED ORDER — FENTANYL CITRATE (PF) 100 MCG/2ML IJ SOLN
INTRAMUSCULAR | Status: DC | PRN
  Administered 2021-06-11 (×2): 25 ug via INTRAVENOUS

## 2021-06-11 MED ORDER — MIDAZOLAM HCL 2 MG/2ML IJ SOLN
INTRAMUSCULAR | Status: DC | PRN
  Administered 2021-06-11: 1 mg via INTRAVENOUS

## 2021-06-11 MED ORDER — ACETAMINOPHEN 325 MG PO TABS: 650.0000 mg | ORAL_TABLET | ORAL | Status: DC | PRN

## 2021-06-11 MED ORDER — LIDOCAINE HCL (PF) 1 % IJ SOLN
INTRAMUSCULAR | Status: DC | PRN
Start: 2021-06-11 — End: 2021-06-12
  Administered 2021-06-11: 20 mL via INTRADERMAL

## 2021-06-11 MED ORDER — SODIUM CHLORIDE 0.9 % IV SOLN: 250.0000 mL | INTRAVENOUS | Status: DC | PRN

## 2021-06-11 MED ORDER — SODIUM CHLORIDE 0.9 % IV SOLN: INTRAVENOUS | Status: AC

## 2021-06-11 MED ORDER — ONDANSETRON HCL 4 MG/2ML IJ SOLN: 4.0000 mg | Freq: Four times a day (QID) | INTRAMUSCULAR | Status: DC | PRN

## 2021-06-11 MED ORDER — SODIUM CHLORIDE 0.9 % IV SOLN: INTRAVENOUS | Status: DC

## 2021-06-11 SURGICAL SUPPLY — 12 items
CATH OMNI FLUSH 5F 65CM (CATHETERS) ×1 IMPLANT
CATH QUICKCROSS .018X135CM (MICROCATHETER) ×1 IMPLANT
CATH TEMPO AQUA 5F 100CM (CATHETERS) ×1 IMPLANT
GLIDEWIRE ADV .035X260CM (WIRE) ×1 IMPLANT
KIT MICROPUNCTURE NIT STIFF (SHEATH) ×1 IMPLANT
KIT PV (KITS) ×3 IMPLANT
SHEATH PINNACLE 5F 10CM (SHEATH) ×1 IMPLANT
SYR MEDRAD MARK V 150ML (SYRINGE) ×1 IMPLANT
TRANSDUCER W/STOPCOCK (MISCELLANEOUS) ×3 IMPLANT
TRAY PV CATH (CUSTOM PROCEDURE TRAY) ×3 IMPLANT
WIRE BENTSON .035X145CM (WIRE) ×1 IMPLANT
WIRE G V18X300CM (WIRE) ×2 IMPLANT

## 2021-06-11 NOTE — Progress Notes (Signed)
SITE AREA: right groin/femoral ? ?SITE PRIOR TO REMOVAL:  LEVEL 0 ? ?PRESSURE APPLIED FOR: approximately 20 minutes ? ?MANUAL: yes ? ?PATIENT STATUS DURING PULL: stable ? ?POST PULL SITE:  LEVEL 0 ? ?POST PULL INSTRUCTIONS GIVEN: yes ? ?POST PULL PULSES PRESENT: bilateral anterior tibial pulses dopplerable, Right greater than Left ? ?DRESSING APPLIED: gauze with tegaderm ? ?BEDREST BEGINS @ 1517 ? ?COMMENTS:  ?

## 2021-06-11 NOTE — H&P (Signed)
History and Physical Interval Note: ? ?06/11/2021 ?12:14 PM ? ?Scott [name]  has presented today for surgery, with the diagnosis of critical limb ischemia left lower extremity.  The various methods of treatment have been discussed with the patient and family. After consideration of risks, benefits and other options for treatment, the patient has consented to  Procedure(s): ?ABDOMINAL AORTOGRAM W/LOWER EXTREMITY (N/A) as a surgical intervention.  The patient's history has been reviewed, patient examined, no change in status, stable for surgery.  I have reviewed the patient's chart and labs.  Questions were answered to the patient's satisfaction.   ? ? ?Cephus Shelling ? ?[facility] [place] ?  ?Patient name: Scott [name]            MRN: [id number]        DOB: [birth year]          Sex: male ?  ?REASON FOR CONSULT: Evaluation critical limb ischemia left foot ?  ?HPI: ?Scott [name] is a 88 y.o. male, who is here today for evaluation.  He is here today with his son.  He has had over 1 month history of pain and tissue loss on his left second toe and also the tip of his left great toe.  He has been treated with several courses of antibiotics and has had persistent difficulty with pain and nonhealing.  He underwent noninvasive studies on 06/03/2021 revealing no audible flow in his left foot and ankle arm index of 0.63 on the right.  He is here today for further evaluation.  He is status post coronary artery bypass grafting in 2010 and had vein harvest in an open fashion from his ankle to his knee.  He has a remote history of attempted atherectomy of the right popliteal artery stenosis with subsequent occlusion of this by duplex.  He does have moderate renal insufficiency with his most recent creatinine of 1.9.  He has a severe rest pain and is unable to sleep at night due to pain in his left foot.  He is on chronic anticoagulation for atrial fibrillation. ?  ?    ?Past Medical History:  ?Diagnosis  Date  ? Chronic anticoagulation    ? Chronic renal insufficiency, stage III (moderate) (HCC)    ? Coronary artery disease 2010  ?  CABG  ? Dyslipidemia    ? Hypertension 12/26/09  ?  renal doppler  ? Hypothyroidism    ? PAF (paroxysmal atrial fibrillation) (HCC)    ? Peripheral vascular disease (HCC) 08/01/10  ?  Carotid doppler: 12/26/09 lower arterial doppler  ? RBBB    ? Shingles April 2012  ?  ?  ?     ?Family History  ?Problem Relation Age of Onset  ? CVA Mother 34  ? Stroke Mother    ? Diabetes Father    ? CAD Brother    ?  ?  ?SOCIAL HISTORY: ?Social History  ?  ?     ?Socioeconomic History  ? Marital status: Married  ?    Spouse name: Not on file  ? Number of children: 2  ? Years of education: Not on file  ? Highest education level: Not on file  ?Occupational History  ? Occupation: retired  ?Tobacco Use  ? Smoking status: Former  ?    Packs/day: 1.00  ?    Types: Cigarettes  ?    Quit date: 07/28/2008  ?    Years since quitting: 12.8  ?  Smokeless tobacco: Never  ?Vaping Use  ? Vaping Use: Never used  ?Substance and Sexual Activity  ? Alcohol use: No  ?    Alcohol/week: 0.0 standard drinks  ? Drug use: Never  ? Sexual activity: Not on file  ?Other Topics Concern  ? Not on file  ?Social History Narrative  ? Not on file  ?  ?Social Determinants of Health  ?  ?Financial Resource Strain: Not on file  ?Food Insecurity: Not on file  ?Transportation Needs: Not on file  ?Physical Activity: Not on file  ?Stress: Not on file  ?Social Connections: Not on file  ?Intimate Partner Violence: Not on file  ?  ?  ?No Known Allergies ?  ?      ?Current Outpatient Medications  ?Medication Sig Dispense Refill  ? atorvastatin (LIPITOR) 20 MG tablet TAKE (1) TABLET BY MOUTH ONCE DAILY. 30 tablet 6  ? cilostazol (PLETAL) 50 MG tablet TAKE (2) TABLETS BY MOUTH TWICE DAILY. 120 tablet 3  ? escitalopram (LEXAPRO) 10 MG tablet Take 10 mg by mouth daily.      ? glimepiride (AMARYL) 1 MG tablet Take 1 mg by mouth at bedtime.      ?  levothyroxine (SYNTHROID) 112 MCG tablet Take 112 mcg by mouth daily before breakfast.       ? losartan-hydrochlorothiazide (HYZAAR) 50-12.5 MG tablet TAKE (1) TABLET BY MOUTH ONCE DAILY. 90 tablet 1  ? metoprolol tartrate (LOPRESSOR) 100 MG tablet TAKE (1) TABLET BY MOUTH TWICE DAILY. 180 tablet 1  ? pioglitazone (ACTOS) 15 MG tablet Take 15 mg by mouth daily.       ? Rivaroxaban (XARELTO) 15 MG TABS tablet TAKE ONE TABLET BY MOUTH ONCE DAILY WITH SUPPER. 30 tablet 1  ?  ?No current facility-administered medications for this visit.  ?  ?  ?REVIEW OF SYSTEMS:  ?[X]  denotes positive finding, [ ]  denotes negative finding ?Cardiac   Comments:  ?Chest pain or chest pressure:      ?Shortness of breath upon exertion:      ?Short of breath when lying flat:      ?Irregular heart rhythm:      ?       ?Vascular      ?Pain in calf, thigh, or hip brought on by ambulation:      ?Pain in feet at night that wakes you up from your sleep:  x    ?Blood clot in your veins:      ?Leg swelling:       ?       ?Pulmonary      ?Oxygen at home:      ?Productive cough:       ?Wheezing:       ?       ?Neurologic      ?Sudden weakness in arms or legs:       ?Sudden numbness in arms or legs:       ?Sudden onset of difficulty speaking or slurred speech:      ?Temporary loss of vision in one eye:       ?Problems with dizziness:       ?       ?Gastrointestinal      ?Blood in stool:       ?Vomited blood:       ?       ?Genitourinary      ?Burning when urinating:       ?Blood in urine:      ?       ?  Psychiatric      ?Major depression:       ?       ?Hematologic      ?Bleeding problems:      ?Problems with blood clotting too easily:      ?       ?Skin      ?Rashes or ulcers:      ?       ?Constitutional      ?Fever or chills:      ?  ?  ?PHYSICAL EXAM: ?   ?Vitals:  ?  06/10/21 1026  ?BP: (!) 78/46  ?Pulse: (!) 102  ?Resp: 14  ?Temp: 97.9 ?F (36.6 ?C)  ?TempSrc: Temporal  ?SpO2: 93%  ?Weight: 147 lb 6.4 oz (66.9 kg)  ?Height: 5' 10.5" (1.791 m)  ?  ?   ?GENERAL: The patient is a well-nourished male, in no acute distress. The vital signs are documented above. ?CARDIOVASCULAR: I do not palpate a left femoral pulse.  He has a 2+ right femoral pulse.  He has absent popliteal and distal pulses bilaterally ?PULMONARY: There is good air exchange  ?MUSCULOSKELETAL: There are no major deformities or cyanosis. ?NEUROLOGIC: No focal weakness or paresthesias are detected. ?SKIN: He has severe ruborous changes in his left foot.  He has full-thickness loss of the distal half of his left second toe and also the ulceration on the tip of his great toe. ?PSYCHIATRIC: The patient has a normal affect. ?  ?DATA:  ?Noninvasive studies reveal no audible flow in his left foot and ankle arm index of 0.63 on the right ?  ?MEDICAL ISSUES: ?Critical limb ischemia left foot.  I discussed this at length with the patient and his son present.  Feel that he is extremely high risk for amputation and would probably be at the above-knee level.  I have recommended urgent arteriography to determine revascularization options.  Explained that there is an outside chance that this could be done with endovascular treatment but in all likelihood would require open surgery.  Explained that with his renal insufficiency, we would minimize contrast as much as possible but would certainly be at risk for worsening renal function.  He understands that he is at very high risk for limb loss if intervention is not undertaken.  He has not taken his Xarelto today and we will hold it today.  He has been scheduled for arteriography and possible intervention at [facility] tomorrow with Dr. Monica Martinez.  I discussed this with Dr. Carlis Abbott by telephone who will coordinate ongoing care at [facility]. ?  ?  ?Rosetta Posner, MD FACS ?[facility] [place] ?Office Tel [phone] ?Pager [phone] ?

## 2021-06-11 NOTE — Op Note (Signed)
? ? ?Patient name: Scott [name] MRN: [id number] DOB: Nov 19, 1929 Sex: male ? ?06/11/2021 ?Pre-operative Diagnosis: Critical limb ischemia of the left lower extremity with tissue loss ?Post-operative diagnosis:  Same ?Surgeon:  Cephus Shelling, MD ?Procedure Performed: ?1.  Ultrasound-guided access right common femoral artery ?2.  CO2 aortogram ?3.  Left lower extremity arteriogram with selection of third order branches ?4.  60 minutes of monitored moderate conscious sedation time ? ?Indications: 88 year old male seen in the office yesterday by Dr. Arbie Cookey with significant tissue loss in the left foot worsening over the last several months with ABIs of 0.  He presents today for aortogram, lower extremity arteriogram, and possible intervention after risks benefits discussed. ? ?Findings:  ? ?Aortogram was performed with CO2 that showed a very diseased infrarenal aorta although this remains patent.  Both iliac arteries are diseased but patent without flow limiting stenosis.  Left renal stent visualized but flow in the renals difficult to evaluate with CO2. ? ?Left lower extremity arteriogram initially performed with CO2 that shows a patent common femoral and profunda and the proximal mid SFA is patent but heavily diseased and calcified.  He has an above-knee popliteal occlusion and on initial imaging we could only see a proximal peroneal and a distal PT reconstitute.  We ultimately got straight flush catheters down the SFA and performed multiple hand injections with some limited contrast that ultimately shows he does reconstitute a peroneal in the proximal calf that then occludes in the mid calf with no outflow.  And he reconstitutes a very diseased and diminutive PT in the mid to distal calf that is not suitable for bypass with limited outflow into the foot and significant small vessel disease.  The AT is occluded.   ? ?I do not think he has any good options for revascularization as I discussed with him and I also  reviewed his imaging with my partner Dr. Karin Lieu who is in agreement. ?  ?Procedure:  The patient was identified in the holding area and taken to room 8.  The patient was then placed supine on the table and prepped and draped in the usual sterile fashion.  A time out was called.  Ultrasound was used to evaluate the right common femoral artery.  It was patent .  A digital ultrasound image was acquired.  A micropuncture needle was used to access the right common femoral artery under ultrasound guidance.  An 018 wire was advanced without resistance and a micropuncture sheath was placed.  The 018 wire was removed and a benson wire was placed.  The micropuncture sheath was exchanged for a 5 french sheath.  An omniflush catheter was advanced over the wire to the level of L-1.  An abdominal angiogram was obtained with CO2.  Next, using the omniflush catheter and a benson wire, the aortic bifurcation was crossed and the catheter was placed into theleft external iliac artery and left runoff was obtained initially with CO2.  Pertinent findings are noted above.  I did take a straight flush catheter down the left SFA to get better imaging with hand injected diluted contrast.  Pertinent findings are noted above.  There is no suitable target for distal bypass.  I felt retrograde PT access was not suitable given no outflow into the foot.  All wires removed.  Taken to holding to have sheath removed.   ? ?Plan:  I will see the patient in 2 weeks to discuss left leg amputation.  I have offered above versus below  knee amputation versus palliative wound care.  He would like to further think about his options.   ? ? ? ? ?Cephus Shelling, MD ?[facility] ?Office: [phone] ? ? ?

## 2021-06-12 ENCOUNTER — Encounter (HOSPITAL_COMMUNITY): Payer: Self-pay | Admitting: Vascular Surgery

## 2021-06-15 ENCOUNTER — Encounter: Payer: Self-pay | Admitting: Physician Assistant

## 2021-06-15 ENCOUNTER — Ambulatory Visit (INDEPENDENT_AMBULATORY_CARE_PROVIDER_SITE_OTHER): Payer: Medicare Other | Admitting: Physician Assistant

## 2021-06-15 VITALS — BP 94/52 | HR 107 | Ht 70.0 in | Wt 146.4 lb

## 2021-06-15 DIAGNOSIS — I4892 Unspecified atrial flutter: Secondary | ICD-10-CM | POA: Diagnosis not present

## 2021-06-15 DIAGNOSIS — I25810 Atherosclerosis of coronary artery bypass graft(s) without angina pectoris: Secondary | ICD-10-CM | POA: Diagnosis not present

## 2021-06-15 DIAGNOSIS — I739 Peripheral vascular disease, unspecified: Secondary | ICD-10-CM | POA: Diagnosis not present

## 2021-06-15 DIAGNOSIS — E782 Mixed hyperlipidemia: Secondary | ICD-10-CM

## 2021-06-15 DIAGNOSIS — I10 Essential (primary) hypertension: Secondary | ICD-10-CM

## 2021-06-15 DIAGNOSIS — I6523 Occlusion and stenosis of bilateral carotid arteries: Secondary | ICD-10-CM

## 2021-06-15 MED ORDER — ATORVASTATIN CALCIUM 20 MG PO TABS: 20.0000 mg | ORAL_TABLET | Freq: Every day | ORAL | 3 refills | Status: AC

## 2021-06-15 NOTE — Patient Instructions (Signed)
Medication Instructions:  ?Stop Losartan-HCTZ ?Restart Lipitor 20 mg tablets daily ? ?Labwork: ?None ? ?Testing/Procedures: ?None ? ?Follow-Up: ?Follow up with Dr. Wyline Mood in 4 months ? ?Any Other Special Instructions Will Be Listed Below (If Applicable). ? ? ? ? ?If you need a refill on your cardiac medications before your next appointment, please call your pharmacy. ? ?

## 2021-06-16 ENCOUNTER — Other Ambulatory Visit: Payer: Self-pay | Admitting: Cardiology

## 2021-06-18 DIAGNOSIS — E1151 Type 2 diabetes mellitus with diabetic peripheral angiopathy without gangrene: Secondary | ICD-10-CM | POA: Diagnosis not present

## 2021-06-18 DIAGNOSIS — S91105D Unspecified open wound of left lesser toe(s) without damage to nail, subsequent encounter: Secondary | ICD-10-CM | POA: Diagnosis not present

## 2021-06-18 DIAGNOSIS — I739 Peripheral vascular disease, unspecified: Secondary | ICD-10-CM | POA: Diagnosis not present

## 2021-06-23 ENCOUNTER — Encounter: Payer: Medicare Other | Admitting: Vascular Surgery

## 2021-07-07 ENCOUNTER — Encounter: Payer: Medicare Other | Admitting: Vascular Surgery

## 2021-07-07 ENCOUNTER — Other Ambulatory Visit: Payer: Self-pay | Admitting: Cardiology

## 2021-07-07 NOTE — Telephone Encounter (Signed)
Prescription refill request for Xarelto received.  Indication: PAF Last office visit: 06/15/21  Leda Gauze PA-C Weight: 50.9TO Age: 88 Scr: 2.10 on 06/11/21 CrCl: 21.96  Based on above findings Xarelto 15mg  daily (renal) is the appropriate dose.  Refill approved.

## 2021-07-23 ENCOUNTER — Encounter (HOSPITAL_COMMUNITY): Payer: Self-pay | Admitting: *Deleted

## 2021-07-23 ENCOUNTER — Emergency Department (HOSPITAL_COMMUNITY): Payer: Medicare Other

## 2021-07-23 ENCOUNTER — Other Ambulatory Visit: Payer: Self-pay

## 2021-07-23 ENCOUNTER — Observation Stay (HOSPITAL_COMMUNITY)
Admission: EM | Admit: 2021-07-23 | Discharge: 2021-07-24 | Disposition: A | Discharge status: Hospice | Payer: Medicare Other | Attending: Internal Medicine | Admitting: Internal Medicine

## 2021-07-23 DIAGNOSIS — D72829 Elevated white blood cell count, unspecified: Secondary | ICD-10-CM | POA: Diagnosis not present

## 2021-07-23 DIAGNOSIS — Z951 Presence of aortocoronary bypass graft: Secondary | ICD-10-CM | POA: Diagnosis not present

## 2021-07-23 DIAGNOSIS — D631 Anemia in chronic kidney disease: Secondary | ICD-10-CM | POA: Diagnosis not present

## 2021-07-23 DIAGNOSIS — Y92480 Sidewalk as the place of occurrence of the external cause: Secondary | ICD-10-CM | POA: Insufficient documentation

## 2021-07-23 DIAGNOSIS — I10 Essential (primary) hypertension: Secondary | ICD-10-CM | POA: Diagnosis not present

## 2021-07-23 DIAGNOSIS — Z7984 Long term (current) use of oral hypoglycemic drugs: Secondary | ICD-10-CM | POA: Insufficient documentation

## 2021-07-23 DIAGNOSIS — N183 Chronic kidney disease, stage 3 unspecified: Secondary | ICD-10-CM | POA: Diagnosis present

## 2021-07-23 DIAGNOSIS — I480 Paroxysmal atrial fibrillation: Secondary | ICD-10-CM

## 2021-07-23 DIAGNOSIS — Z79899 Other long term (current) drug therapy: Secondary | ICD-10-CM | POA: Diagnosis not present

## 2021-07-23 DIAGNOSIS — Z87891 Personal history of nicotine dependence: Secondary | ICD-10-CM | POA: Insufficient documentation

## 2021-07-23 DIAGNOSIS — M869 Osteomyelitis, unspecified: Secondary | ICD-10-CM

## 2021-07-23 DIAGNOSIS — E875 Hyperkalemia: Secondary | ICD-10-CM

## 2021-07-23 DIAGNOSIS — E039 Hypothyroidism, unspecified: Secondary | ICD-10-CM | POA: Diagnosis not present

## 2021-07-23 DIAGNOSIS — Z043 Encounter for examination and observation following other accident: Secondary | ICD-10-CM | POA: Diagnosis not present

## 2021-07-23 DIAGNOSIS — L97528 Non-pressure chronic ulcer of other part of left foot with other specified severity: Secondary | ICD-10-CM | POA: Diagnosis not present

## 2021-07-23 DIAGNOSIS — Z7901 Long term (current) use of anticoagulants: Secondary | ICD-10-CM | POA: Diagnosis not present

## 2021-07-23 DIAGNOSIS — W19XXXA Unspecified fall, initial encounter: Secondary | ICD-10-CM | POA: Diagnosis not present

## 2021-07-23 DIAGNOSIS — E1152 Type 2 diabetes mellitus with diabetic peripheral angiopathy with gangrene: Secondary | ICD-10-CM | POA: Insufficient documentation

## 2021-07-23 DIAGNOSIS — Y9389 Activity, other specified: Secondary | ICD-10-CM | POA: Diagnosis not present

## 2021-07-23 DIAGNOSIS — I517 Cardiomegaly: Secondary | ICD-10-CM | POA: Diagnosis not present

## 2021-07-23 DIAGNOSIS — N1832 Chronic kidney disease, stage 3b: Secondary | ICD-10-CM | POA: Diagnosis not present

## 2021-07-23 DIAGNOSIS — E1122 Type 2 diabetes mellitus with diabetic chronic kidney disease: Secondary | ICD-10-CM | POA: Insufficient documentation

## 2021-07-23 DIAGNOSIS — R531 Weakness: Secondary | ICD-10-CM | POA: Diagnosis present

## 2021-07-23 DIAGNOSIS — A419 Sepsis, unspecified organism: Secondary | ICD-10-CM | POA: Diagnosis not present

## 2021-07-23 DIAGNOSIS — S0990XA Unspecified injury of head, initial encounter: Secondary | ICD-10-CM | POA: Diagnosis not present

## 2021-07-23 DIAGNOSIS — D649 Anemia, unspecified: Secondary | ICD-10-CM

## 2021-07-23 DIAGNOSIS — L089 Local infection of the skin and subcutaneous tissue, unspecified: Secondary | ICD-10-CM | POA: Diagnosis not present

## 2021-07-23 DIAGNOSIS — I2510 Atherosclerotic heart disease of native coronary artery without angina pectoris: Secondary | ICD-10-CM | POA: Diagnosis not present

## 2021-07-23 DIAGNOSIS — Z95828 Presence of other vascular implants and grafts: Secondary | ICD-10-CM | POA: Insufficient documentation

## 2021-07-23 DIAGNOSIS — M79622 Pain in left upper arm: Secondary | ICD-10-CM | POA: Diagnosis not present

## 2021-07-23 DIAGNOSIS — W109XXA Fall (on) (from) unspecified stairs and steps, initial encounter: Secondary | ICD-10-CM | POA: Diagnosis not present

## 2021-07-23 DIAGNOSIS — M19012 Primary osteoarthritis, left shoulder: Secondary | ICD-10-CM | POA: Diagnosis not present

## 2021-07-23 DIAGNOSIS — M4312 Spondylolisthesis, cervical region: Secondary | ICD-10-CM | POA: Diagnosis not present

## 2021-07-23 DIAGNOSIS — I96 Gangrene, not elsewhere classified: Secondary | ICD-10-CM | POA: Diagnosis not present

## 2021-07-23 DIAGNOSIS — M86172 Other acute osteomyelitis, left ankle and foot: Secondary | ICD-10-CM | POA: Diagnosis not present

## 2021-07-23 DIAGNOSIS — M7989 Other specified soft tissue disorders: Secondary | ICD-10-CM | POA: Diagnosis not present

## 2021-07-23 DIAGNOSIS — I739 Peripheral vascular disease, unspecified: Secondary | ICD-10-CM | POA: Diagnosis present

## 2021-07-23 LAB — CBC WITH DIFFERENTIAL/PLATELET
Abs Immature Granulocytes: 0.08 10*3/uL — ABNORMAL HIGH (ref 0.00–0.07)
Basophils Absolute: 0 10*3/uL (ref 0.0–0.1)
Basophils Relative: 0 %
Eosinophils Absolute: 0 10*3/uL (ref 0.0–0.5)
Eosinophils Relative: 0 %
HCT: 25.1 % — ABNORMAL LOW (ref 39.0–52.0)
Hemoglobin: 7.8 g/dL — ABNORMAL LOW (ref 13.0–17.0)
Immature Granulocytes: 1 %
Lymphocytes Relative: 4 %
Lymphs Abs: 0.5 10*3/uL — ABNORMAL LOW (ref 0.7–4.0)
MCH: 31.1 pg (ref 26.0–34.0)
MCHC: 31.1 g/dL (ref 30.0–36.0)
MCV: 100 fL (ref 80.0–100.0)
Monocytes Absolute: 0.5 10*3/uL (ref 0.1–1.0)
Monocytes Relative: 5 %
Neutro Abs: 10.1 10*3/uL — ABNORMAL HIGH (ref 1.7–7.7)
Neutrophils Relative %: 90 %
Platelets: 278 10*3/uL (ref 150–400)
RBC: 2.51 MIL/uL — ABNORMAL LOW (ref 4.22–5.81)
RDW: 15.1 % (ref 11.5–15.5)
WBC: 11.3 10*3/uL — ABNORMAL HIGH (ref 4.0–10.5)
nRBC: 0 % (ref 0.0–0.2)

## 2021-07-23 LAB — BASIC METABOLIC PANEL
Anion gap: 8 (ref 5–15)
BUN: 28 mg/dL — ABNORMAL HIGH (ref 8–23)
CO2: 23 mmol/L (ref 22–32)
Calcium: 8.9 mg/dL (ref 8.9–10.3)
Chloride: 101 mmol/L (ref 98–111)
Creatinine, Ser: 1.67 mg/dL — ABNORMAL HIGH (ref 0.61–1.24)
GFR, Estimated: 39 mL/min — ABNORMAL LOW (ref >60)
Glucose, Bld: 218 mg/dL — ABNORMAL HIGH (ref 70–99)
Potassium: 5.3 mmol/L — ABNORMAL HIGH (ref 3.5–5.1)
Sodium: 132 mmol/L — ABNORMAL LOW (ref 135–145)

## 2021-07-23 LAB — C-REACTIVE PROTEIN: CRP: 10.8 mg/dL — ABNORMAL HIGH (ref <1.0)

## 2021-07-23 LAB — LACTIC ACID, PLASMA
Lactic Acid, Venous: 2.1 mmol/L (ref 0.5–1.9)
Lactic Acid, Venous: 2.3 mmol/L (ref 0.5–1.9)

## 2021-07-23 LAB — MAGNESIUM: Magnesium: 1.5 mg/dL — ABNORMAL LOW (ref 1.7–2.4)

## 2021-07-23 LAB — SEDIMENTATION RATE: Sed Rate: 100 mm/hr — ABNORMAL HIGH (ref 0–16)

## 2021-07-23 MED ORDER — SODIUM CHLORIDE 0.9 % IV SOLN: INTRAVENOUS | Status: AC

## 2021-07-23 MED ORDER — CILOSTAZOL 100 MG PO TABS
100.0000 mg | ORAL_TABLET | Freq: Two times a day (BID) | ORAL | Status: DC
  Administered 2021-07-23: 100 mg via ORAL
  Filled 2021-07-23 (×2): qty 1

## 2021-07-23 MED ORDER — PIPERACILLIN-TAZOBACTAM 3.375 G IVPB 30 MIN
3.3750 g | Freq: Once | INTRAVENOUS | Status: AC
  Administered 2021-07-23: 3.375 g via INTRAVENOUS
  Filled 2021-07-23: qty 50

## 2021-07-23 MED ORDER — ONDANSETRON HCL 4 MG PO TABS: 4.0000 mg | ORAL_TABLET | Freq: Four times a day (QID) | ORAL | Status: DC | PRN

## 2021-07-23 MED ORDER — ATORVASTATIN CALCIUM 20 MG PO TABS: 20.0000 mg | ORAL_TABLET | Freq: Every day | ORAL | Status: DC

## 2021-07-23 MED ORDER — ACETAMINOPHEN 325 MG PO TABS
650.0000 mg | ORAL_TABLET | Freq: Four times a day (QID) | ORAL | Status: DC | PRN
  Administered 2021-07-24: 650 mg via ORAL
  Filled 2021-07-23: qty 2

## 2021-07-23 MED ORDER — OXYCODONE-ACETAMINOPHEN 5-325 MG PO TABS
1.0000 | ORAL_TABLET | Freq: Once | ORAL | Status: AC
  Administered 2021-07-23: 1 via ORAL
  Filled 2021-07-23: qty 1

## 2021-07-23 MED ORDER — ACETAMINOPHEN 650 MG RE SUPP: 650.0000 mg | Freq: Four times a day (QID) | RECTAL | Status: DC | PRN

## 2021-07-23 MED ORDER — VANCOMYCIN HCL 1250 MG/250ML IV SOLN
1250.0000 mg | INTRAVENOUS | Status: DC
Start: 2021-07-25 — End: 2021-07-24

## 2021-07-23 MED ORDER — ENOXAPARIN SODIUM 30 MG/0.3ML IJ SOSY: 30.0000 mg | PREFILLED_SYRINGE | INTRAMUSCULAR | Status: DC

## 2021-07-23 MED ORDER — SODIUM CHLORIDE 0.9 % IV SOLN
2.0000 g | INTRAVENOUS | Status: DC
  Administered 2021-07-23: 2 g via INTRAVENOUS
  Filled 2021-07-23: qty 12.5

## 2021-07-23 MED ORDER — SODIUM ZIRCONIUM CYCLOSILICATE 5 G PO PACK
5.0000 g | PACK | Freq: Once | ORAL | Status: AC
  Administered 2021-07-23: 5 g via ORAL
  Filled 2021-07-23: qty 1

## 2021-07-23 MED ORDER — ONDANSETRON HCL 4 MG/2ML IJ SOLN: 4.0000 mg | Freq: Four times a day (QID) | INTRAMUSCULAR | Status: DC | PRN

## 2021-07-23 MED ORDER — SODIUM CHLORIDE 0.9 % IV BOLUS
1000.0000 mL | Freq: Once | INTRAVENOUS | Status: AC
Start: 2021-07-23 — End: 2021-07-23
  Administered 2021-07-23: 1000 mL via INTRAVENOUS

## 2021-07-23 MED ORDER — VANCOMYCIN HCL 1500 MG/300ML IV SOLN
1500.0000 mg | Freq: Once | INTRAVENOUS | Status: AC
  Administered 2021-07-23: 1500 mg via INTRAVENOUS
  Filled 2021-07-23: qty 300

## 2021-07-23 MED ORDER — LACTATED RINGERS IV BOLUS
1000.0000 mL | Freq: Once | INTRAVENOUS | Status: AC
  Administered 2021-07-23: 1000 mL via INTRAVENOUS

## 2021-07-23 MED ORDER — POLYETHYLENE GLYCOL 3350 17 G PO PACK: 17.0000 g | PACK | Freq: Every day | ORAL | Status: DC | PRN

## 2021-07-23 NOTE — H&P (Signed)
History and Physical    Scott [name]:[id number] DOB: 1929/08/01 DOA: 07/23/2021  PCP: Sharilyn Sites, MD   Patient coming from: Home  I have personally briefly reviewed patient's old medical records in [place]  Chief Complaint: Fall  HPI: Scott [name] is a 88 y.o. male with medical history significant for CABG, CKD 3, paroxysmal atrial fibrillation on chronic anticoagulation, peripheral vascular disease. Patient presented to ED with complaints of a fall.  Patient reports he was helping his wife unload groceries when he lost his balance and fell.  He denies hitting his head.  He reports over the past week he has fallen 2 times, he reports that he fell against the wall.  He denies any chest pain or difficulty breathing.  He denies dizziness.  Reports feeling of weakness recently he is unable to quantify exactly how long, also reports that he has not been able to walk long distances and gets short of breath. No fever no chills.  No vomiting no loose stools.  Reports black stools when he was on iron tablets but none since then.  No blood in stools.  No abdominal pain.  Patient has a chronic wound to his second toe of his left foot, for which amputation was offered but patient declined.  Patient tells me he would rather die than have his lower extremity.  ED Course: Temperature 97.5.  Heart rate 112-116.  Respiratory 20-21.  Blood pressures systolic 86-578.  WBC 11.2.  ESR 100, lactic acid 2.1 > 3.  Hemoglobin 7.8. Head and cervical CT without acute abnormality, shows multilevel degenerative changes to the spine. Humerus left shoulder and chest x-ray unremarkable.  Left foot x-ray suggest osteomyelitis of the second toe also soft tissue swelling and irregularity of the fifth and third toes. IV Vanco and Zosyn given. Potassium 5.3, Lokelma 32m was given. Hospitalist to admit for anemia, fall, and sepsis secondary to left toe osteomyelitis.  Review of Systems: As per HPI all other  systems reviewed and negative.  Past Medical History:  Diagnosis Date   Chronic anticoagulation    Chronic renal insufficiency, stage III (moderate) (HCC)    Coronary artery disease 2010   CABG   Dyslipidemia    Hypertension 12/26/09   renal doppler   Hypothyroidism    [place]    [place] 08/[place]   Carotid doppler: 12/26/09 lower arterial doppler   RBBB    Shingles April 2012    Past Surgical History:  Procedure Laterality Date   ABDOMINAL AORTOGRAM W/LOWER EXTREMITY N/A 06/11/2021   Procedure: ABDOMINAL AORTOGRAM W/LOWER EXTREMITY;  Surgeon: CMarty Heck MD;  [facility];  Service: Cardiovascular;  Laterality: N/A;   CARDIAC CATHETERIZATION  6/10   CORONARY ARTERY BYPASS GRAFT  2010   3-vessel by-pass   HERNIA REPAIR  3/09   POPLITEAL ARTERY ANGIOPLASTY Right    PROSTATE SURGERY  11/99   RENAL ARTERY ANGIOPLASTY Left 9/11     reports that he quit smoking about 12 years ago. His smoking use included cigarettes. He smoked an average of 1 pack per day. He has never used smokeless tobacco. He reports that he does not drink alcohol and does not use drugs.  No Known Allergies  Family History  Problem Relation Age of Onset   CVA Mother 629  Stroke Mother    Diabetes Father    CAD Brother     Prior to Admission medications  Medication Sig Start Date End Date Taking? Authorizing Provider  acetaminophen (TYLENOL) 500 MG tablet Take 500-1,000 mg by mouth 3 (three) times daily as needed (pain.).   Yes [provider]  atorvastatin (LIPITOR) 20 MG tablet Take 1 tablet (20 mg total) by mouth daily. 06/15/21 06/10/22 Yes Imogene Burn, PA-C  cilostazol (PLETAL) 50 MG tablet TAKE (2) TABLETS BY MOUTH TWICE DAILY. Patient taking differently: Take 100 mg by mouth 2 (two) times daily. TAKE (2) TABLETS BY MOUTH TWICE DAILY. 05/14/21  Yes Branch, Alphonse Guild, MD  doxycycline (VIBRA-TABS) 100 MG  tablet Take 100 mg by mouth 2 (two) times daily. 07/14/21  Yes [provider]  escitalopram (LEXAPRO) 10 MG tablet Take 10 mg by mouth in the morning.   Yes [provider]  glimepiride (AMARYL) 1 MG tablet Take 1 mg by mouth in the morning. 04/11/17  Yes [provider]  HYDROcodone-acetaminophen (NORCO/VICODIN) 5-325 MG tablet Take 1 tablet by mouth every 6 (six) hours as needed for moderate pain. 06/11/21 06/11/22 Yes Baglia, Corrina, PA-C  levothyroxine (SYNTHROID) 112 MCG tablet Take 112 mcg by mouth daily before breakfast.  08/18/18  Yes [provider]  metoprolol tartrate (LOPRESSOR) 100 MG tablet TAKE (1) TABLET BY MOUTH TWICE DAILY. Patient taking differently: Take 100 mg by mouth 2 (two) times daily. 06/16/21  Yes BranchAlphonse Guild, MD  omeprazole (PRILOSEC) 20 MG capsule Take 20 mg by mouth daily. 06/16/21  Yes [provider]  pioglitazone (ACTOS) 15 MG tablet Take 15 mg by mouth every evening. 07/11/18  Yes [provider]  Rivaroxaban (XARELTO) 15 MG TABS tablet TAKE ONE TABLET BY MOUTH ONCE DAILY WITH SUPPER. Patient taking differently: Take 15 mg by mouth daily with supper. 07/07/21  Yes Arnoldo Lenis, MD    Physical Exam: Vitals:   07/23/21 1535 07/23/21 1730 07/23/21 1800 07/23/21 1855  BP: 129/65 (!) 111/57 (!) 103/52 (!) 108/54  Pulse: (!) 112 (!) 113 (!) 114 (!) 110  Resp: (!) 21 20 (!) 22 18  Temp:    98 F (36.7 C)  TempSrc:    Oral  SpO2: 91% 96% 95% 100%  Weight:      Height:        Constitutional: NAD, calm, comfortable Vitals:   07/23/21 1535 07/23/21 1730 07/23/21 1800 07/23/21 1855  BP: 129/65 (!) 111/57 (!) 103/52 (!) 108/54  Pulse: (!) 112 (!) 113 (!) 114 (!) 110  Resp: (!) 21 20 (!) 22 18  Temp:    98 F (36.7 C)  TempSrc:    Oral  SpO2: 91% 96% 95% 100%  Weight:      Height:       Eyes: PERRL, lids and conjunctivae normal ENMT: Mucous membranes are moist.   Neck: normal, supple, no masses, no  thyromegaly Respiratory: clear to auscultation bilaterally, no wheezing, no crackles. Normal respiratory effort. No accessory muscle use.  Cardiovascular: Regular rate and rhythm, no murmurs / rubs / gallops. No extremity edema.  Abdomen: no tenderness, no masses palpated. No hepatosplenomegaly. Bowel sounds positive.  Musculoskeletal: Dry appearing gangrene to second toe of left foot. Skin: Dry appearing gangrene to second toe of left foot, with dark purplish almost black discoloration to first and third toes also, with wounds on ventral creases of both toes.  Erythema extending towards ankle of left foot.  No differential warmth.  No appreciable tenderness. Neurologic: No apparent cranial abnormality moving extremities spontaneously Psychiatric: Normal judgment and insight. Alert and oriented  x 3. Normal mood.      Labs on Admission: I have personally reviewed following labs and imaging studies  CBC: Recent Labs  Lab 07/23/21 1459  WBC 11.3*  NEUTROABS 10.1*  HGB 7.8*  HCT 25.1*  MCV 100.0  PLT 638   Basic Metabolic Panel: Recent Labs  Lab 07/23/21 1459  NA 132*  K 5.3*  CL 101  CO2 23  GLUCOSE 218*  BUN 28*  CREATININE 1.67*  CALCIUM 8.9   Radiological Exams on Admission: DG Foot Complete Left  Result Date: 07/23/2021 CLINICAL DATA:  Foot infection EXAM: LEFT FOOT - COMPLETE 3+ VIEW COMPARISON:  None Available. FINDINGS: There is left foot soft tissue swelling. There is no evidence of acute fracture. There is soft tissue irregularity of the first and second toes and medial aspect of the third toe. There is osseous lucency of the second toe distal and middle phalanx. There is moderate first MTP osteoarthritis. Plantar and dorsal calcaneal spurring. Vascular calcifications. IMPRESSION: Soft tissue swelling and irregularity of the first and third toes with osseous lucency of the second toe distal and middle phalanx which could potentially represent osteomyelitis.  Electronically Signed   By: Maurine Simmering M.D.   On: 07/23/2021 16:25   DG Chest 1 View  Result Date: 07/23/2021 CLINICAL DATA:  Fall EXAM: CHEST  1 VIEW COMPARISON:  Radiograph 12/02/2009 FINDINGS: The patient is rotated towards the left. Borderline enlarged cardiac silhouette. Prior median sternotomy and CABG. There are left basilar opacities. No pleural effusion. No pneumothorax. No acute osseous abnormality on single frontal view of the chest. IMPRESSION: Left basilar opacities which could represent atelectasis. No acute osseous abnormality on single frontal view of the chest. Electronically Signed   By: Maurine Simmering M.D.   On: 07/23/2021 16:23   DG Shoulder Left  Result Date: 07/23/2021 CLINICAL DATA:  Left shoulder pain EXAM: LEFT SHOULDER - 2+ VIEW COMPARISON:  None Available. FINDINGS: There is no evidence of fracture. High-riding humeral head. There is moderate glenohumeral and AC joint osteoarthritis. Subacromial and greater tuberosity irregularity. IMPRESSION: No acute osseous abnormality Moderate glenohumeral and AC joint osteoarthritis. Findings which can be seen with chronic full-thickness cuff tear. Electronically Signed   By: Maurine Simmering M.D.   On: 07/23/2021 16:20   DG Humerus Left  Result Date: 07/23/2021 CLINICAL DATA:  Pain EXAM: LEFT HUMERUS - 2+ VIEW COMPARISON:  None Available. FINDINGS: There is no evidence of acute fracture involving the left humerus. There are degenerative changes of the elbow noted. IMPRESSION: No evidence of acute fracture involving the left humerus. Electronically Signed   By: Maurine Simmering M.D.   On: 07/23/2021 16:19   CT Head Wo Contrast  Result Date: 07/23/2021 CLINICAL DATA:  Neck trauma, dangerous injury mechanism (Age 48-64y); Head trauma, moderate-severe EXAM: CT HEAD WITHOUT CONTRAST CT CERVICAL SPINE WITHOUT CONTRAST TECHNIQUE: Multidetector CT imaging of the head and cervical spine was performed following the standard protocol without intravenous  contrast. Multiplanar CT image reconstructions of the cervical spine were also generated. RADIATION DOSE REDUCTION: This exam was performed according to the departmental dose-optimization program which includes automated exposure control, adjustment of the mA and/or kV according to patient size and/or use of iterative reconstruction technique. COMPARISON:  CTA head/neck July 15, 2008. FINDINGS: CT HEAD FINDINGS Brain: No evidence of acute infarction, hemorrhage, hydrocephalus, extra-axial collection or mass lesion/mass effect. Vascular: No hyperdense vessel identified. Skull: No acute fracture. Sinuses/Orbits: Clear sinuses.  No acute orbital findings. Other: No mastoid  effusions. CT CERVICAL SPINE FINDINGS Alignment: Mild anterolisthesis of C4 on C5 and C5 on C6, probably degenerative given degenerative changes at these levels. Otherwise, no substantial sagittal subluxation. Skull base and vertebrae: Vertebral body heights are maintained. No evidence of acute fracture. Soft tissues and spinal canal: No prevertebral fluid or swelling. No visible canal hematoma. Disc levels: Multilevel degenerative change, greatest at C4-C5 where there is potentially moderate canal stenosis and severe bilateral foraminal stenosis. Upper chest: Visualized lung apices are clear. IMPRESSION: 1. No evidence of acute intracranial abnormality. 2. No evidence of acute fracture or traumatic malalignment in the cervical spine. 3. Multilevel degenerative change, greatest at C4-C5 where there is potentially moderate canal stenosis and severe bilateral foraminal stenosis. MRI of the cervical spine could better characterize the canal/cord/foramina if clinically warranted. Electronically Signed   By: Margaretha Sheffield M.D.   On: 07/23/2021 16:03   CT Cervical Spine Wo Contrast  Result Date: 07/23/2021 CLINICAL DATA:  Neck trauma, dangerous injury mechanism (Age 10-64y); Head trauma, moderate-severe EXAM: CT HEAD WITHOUT CONTRAST CT CERVICAL  SPINE WITHOUT CONTRAST TECHNIQUE: Multidetector CT imaging of the head and cervical spine was performed following the standard protocol without intravenous contrast. Multiplanar CT image reconstructions of the cervical spine were also generated. RADIATION DOSE REDUCTION: This exam was performed according to the departmental dose-optimization program which includes automated exposure control, adjustment of the mA and/or kV according to patient size and/or use of iterative reconstruction technique. COMPARISON:  CTA head/neck July 15, 2008. FINDINGS: CT HEAD FINDINGS Brain: No evidence of acute infarction, hemorrhage, hydrocephalus, extra-axial collection or mass lesion/mass effect. Vascular: No hyperdense vessel identified. Skull: No acute fracture. Sinuses/Orbits: Clear sinuses.  No acute orbital findings. Other: No mastoid effusions. CT CERVICAL SPINE FINDINGS Alignment: Mild anterolisthesis of C4 on C5 and C5 on C6, probably degenerative given degenerative changes at these levels. Otherwise, no substantial sagittal subluxation. Skull base and vertebrae: Vertebral body heights are maintained. No evidence of acute fracture. Soft tissues and spinal canal: No prevertebral fluid or swelling. No visible canal hematoma. Disc levels: Multilevel degenerative change, greatest at C4-C5 where there is potentially moderate canal stenosis and severe bilateral foraminal stenosis. Upper chest: Visualized lung apices are clear. IMPRESSION: 1. No evidence of acute intracranial abnormality. 2. No evidence of acute fracture or traumatic malalignment in the cervical spine. 3. Multilevel degenerative change, greatest at C4-C5 where there is potentially moderate canal stenosis and severe bilateral foraminal stenosis. MRI of the cervical spine could better characterize the canal/cord/foramina if clinically warranted. Electronically Signed   By: Margaretha Sheffield M.D.   On: 07/23/2021 16:03    EKG: Independently reviewed.  Rate 111,  ectopic atrial tachycardia QTc prolonged 505.  RBBB, LAFB.  No changes compared to prior EKG.  Assessment/Plan Principal Problem:   Sepsis ([place]) Active Problems:   Dry gangrene (HCC) left second toe   CAD- CABG 6/10, low risk Myoview 8/13   Peripheral vascular disease (HCC)   Chronic anticoagulation   Chronic renal insufficiency, stage III (moderate) (HCC)   PAF (paroxysmal atrial fibrillation) ([place])   Fall   Acute on chronic anemia  Assessment and Plan: * Sepsis ([place]) Meeting sepsis criteria with tachycardia heart rate 112-116, with borderline leukocytosis of 11.2, lactic acid of 2.1 > 2.3.  As of infection likely from patient's left lower extremity the second toe-with dry gangrene, fifth and third to appear to be having some degree of necrosis also, with mild surrounding cellulitis extending upwards towards ankle. -1 L bolus given,  will give second bolus - N/s 75cc/hr x 15hrs -IV vancomycin and cefepime -Follow-up blood cultures -Trend lactic acid, which may continue to trend up with gangrene of toes  Dry gangrene (HCC) left second toe Amputation has been offered, due to peripheral vascular disease.  Patient has declined.  For now he is septic-likely early sepsis and hence he has been admitted.  First and second toe also appear discolored and likely some degree of necrotic also. - Patient had left lower ext arteriogram for critical limb ischemia 06/11/21, by Dr. Donnetta Hutching, he was offered above versus below-knee amputation versus palliative wound care.  He was to follow-up in 2 weeks. -Palliative care consult   Acute on chronic anemia Hemoglobin 7.8, recent baseline 10-11. Last check 5/4- hemoglobin was 11.  He is on anticoagulation with Xarelto.  He denies any hematochezia or hematemesis.  He reports generalized weakness, dyspnea on exertion with falls-hard to tell 2/2 symptomatic anemia versus sepsis.   -CBC in the morning, likely drop with hydration. -Iron panel -Hold Xarelto for now  with acute drop in hemoglobin - Low threshold to transfuse  PAF (paroxysmal atrial fibrillation) (HCC) EKG showing ectopic atrial tachycardia. -Hold Xarelto with acute drop in hemoglobin -Resume metoprolol  Chronic renal insufficiency, stage III (moderate) (HCC) CKD 3B.  Creatinine 1.6.  Recent baseline of 1.9- 2.1  Hypertension Resume metoprolol in a.m.  CAD- CABG 6/10, low risk Myoview 8/13 CABG 2010.  Follows with Dr. Harl Bowie.  No chest pain.  EKG unchanged.    DVT prophylaxis: SCDs Code Status: DNR, confirmed with patient at bedside. Family Communication:  None at bedside Disposition Plan: ~/> 2 days Consults called: None Admission status: Inpt tele I certify that at the point of admission it is my clinical judgment that the patient will require inpatient hospital care spanning beyond 2 midnights from the point of admission due to high intensity of service, high risk for further deterioration and high frequency of surveillance required.   Author: Bethena Roys, MD 07/23/2021 8:50 PM  For on call review [email or web address].

## 2021-07-23 NOTE — Assessment & Plan Note (Signed)
CKD 3B.  Creatinine 1.6.  Recent baseline of 1.9- 2.1

## 2021-07-23 NOTE — Assessment & Plan Note (Signed)
Amputation has been offered, due to peripheral vascular disease.  Patient has declined.  For now he is septic-likely early sepsis and hence he has been admitted.  First and second toe also appear discolored and likely some degree of necrotic also. - Patient had left lower ext arteriogram for critical limb ischemia 06/11/21, by Dr. Arbie Cookey, he was offered above versus below-knee amputation versus palliative wound care.  He was to follow-up in 2 weeks. -Palliative care consult

## 2021-07-23 NOTE — Assessment & Plan Note (Signed)
CABG 2010.  Follows with Dr. Wyline Mood.  No chest pain.  EKG unchanged.

## 2021-07-23 NOTE — ED Notes (Signed)
HH nurse states pt is currently being seen for wound to left foot, pt refuses amputation.

## 2021-07-23 NOTE — ED Triage Notes (Signed)
Pt states he lost his balance while getting groceries out of the car and fell on pavement on left side.  C/o shoulder pain. Denies hitting his head. +blood thinners.

## 2021-07-23 NOTE — Assessment & Plan Note (Addendum)
Meeting sepsis criteria with tachycardia heart rate 112-116, with borderline leukocytosis of 11.2, lactic acid of 2.1 > 2.3.  As of infection likely from patient's left lower extremity the second toe-with dry gangrene, fifth and third to appear to be having some degree of necrosis also, with mild surrounding cellulitis extending upwards towards ankle. -1 L bolus given, will give second bolus - N/s 75cc/hr x 15hrs -IV vancomycin and cefepime -Follow-up blood cultures -Trend lactic acid, which may continue to trend up with gangrene of toes

## 2021-07-23 NOTE — ED Provider Notes (Signed)
[facility] EMERGENCY DEPARTMENT Provider Note   CSN: [id number] Arrival date & time: 07/23/21  1300     History  Chief Complaint  Patient presents with   Scott [name]    Scott [name] is a 88 y.o. male with a past medical history of peripheral vascular disease, hypertension, stage III chronic kidney disease, CAD presenting today after fall.  He reports that he was helping his wife with groceries when he fell on the stairs.  Fell backwards onto his left shoulder.  Denies hitting his head or losing consciousness.  Is on Xarelto from [place] from a 2015 CABG and paroxysmal A-fib.  Says that he has no history of seizure and syncope.  He believes it was a mechanical fall.  Of note, patient is being treated for a wound in his left lower extremity.  He has been told that he has minimal blood flow and that he needs an amputation.  He says that he is not willing to do that and has a wound care nurse come to his home   Fall Pertinent negatives include no chest pain, no abdominal pain and no shortness of breath.       Home Medications Prior to Admission medications   Medication Sig Start Date End Date Taking? Authorizing Provider  acetaminophen (TYLENOL) 500 MG tablet Take 500-1,000 mg by mouth 3 (three) times daily as needed (pain.).    [provider]  atorvastatin (LIPITOR) 20 MG tablet Take 1 tablet (20 mg total) by mouth daily. 06/15/21 06/10/22  Imogene Burn, PA-C  Cholecalciferol (VITAMIN D3 PO) Take 1 tablet by mouth in the morning.    [provider]  cilostazol (PLETAL) 50 MG tablet TAKE (2) TABLETS BY MOUTH TWICE DAILY. 05/14/21   Arnoldo Lenis, MD  escitalopram (LEXAPRO) 10 MG tablet Take 10 mg by mouth in the morning.    [provider]  glimepiride (AMARYL) 1 MG tablet Take 1 mg by mouth in the morning. 04/11/17   [provider]  HYDROcodone-acetaminophen (NORCO/VICODIN) 5-325 MG tablet Take 1 tablet by mouth every 6 (six) hours as needed for  moderate pain. 06/11/21 06/11/22  Karoline Caldwell, PA-C  levothyroxine (SYNTHROID) 112 MCG tablet Take 112 mcg by mouth daily before breakfast.  08/18/18   [provider]  metoprolol tartrate (LOPRESSOR) 100 MG tablet TAKE (1) TABLET BY MOUTH TWICE DAILY. 06/16/21   Arnoldo Lenis, MD  pioglitazone (ACTOS) 15 MG tablet Take 15 mg by mouth every evening. 07/11/18   [provider]  Rivaroxaban (XARELTO) 15 MG TABS tablet TAKE ONE TABLET BY MOUTH ONCE DAILY WITH SUPPER. 07/07/21   Branch, Alphonse Guild, MD      Allergies    Patient has no known allergies.    Review of Systems   Review of Systems  Constitutional:  Positive for fatigue. Negative for chills and fever.  Respiratory:  Negative for chest tightness and shortness of breath.   Cardiovascular:  Negative for chest pain and palpitations.  Gastrointestinal:  Negative for abdominal pain, anal bleeding, blood in stool and rectal pain.  Genitourinary:  Negative for hematuria.    Physical Exam Updated Vital Signs BP (!) 123/58   Pulse (!) 113   Temp (!) 97.5 F (36.4 C) (Oral)   Resp 20   Ht _0  (1.778 m)   Wt 67.1 kg   SpO2 93%   BMI 21.24 kg/m  Physical Exam Vitals and nursing note reviewed.  Constitutional:      Appearance:  Normal appearance.  HENT:     Head: Normocephalic and atraumatic.  Eyes:     General: No scleral icterus.    Conjunctiva/sclera: Conjunctivae normal.     Pupils: Pupils are equal, round, and reactive to light.  Cardiovascular:     Rate and Rhythm: Regular rhythm. Tachycardia present.     Comments: Unable to pulses on patient's left lower extremity, as expected Pulmonary:     Effort: Pulmonary effort is normal. No respiratory distress.     Breath sounds: No wheezing.  Abdominal:     General: Abdomen is flat.     Palpations: Abdomen is soft.  Musculoskeletal:        General: No deformity.     Comments: Patient range of motion of the left upper extremity which he reports is baseline.   Passive range of motion intact.  No obvious deformities or acute bruising.  Photo of patient's foot as below  Skin:    General: Skin is warm and dry.     Coloration: Skin is pale.     Findings: No rash.  Neurological:     Mental Status: He is alert.     Sensory: No sensory deficit.     Comments: Normal lower extremity sensation  Psychiatric:        Mood and Affect: Mood normal.        Behavior: Behavior normal.      Media Information    ED Results / Procedures / Treatments   Labs (all labs ordered are listed, but only abnormal results are displayed) Labs Reviewed  CBC WITH DIFFERENTIAL/PLATELET - Abnormal; Notable for the following components:      Result Value   WBC 11.3 (*)    RBC 2.51 (*)    Hemoglobin 7.8 (*)    HCT 25.1 (*)    Neutro Abs 10.1 (*)    Lymphs Abs 0.5 (*)    Abs Immature Granulocytes 0.08 (*)    All other components within normal limits  BASIC METABOLIC PANEL - Abnormal; Notable for the following components:   Sodium 132 (*)    Potassium 5.3 (*)    Glucose, Bld 218 (*)    BUN 28 (*)    Creatinine, Ser 1.67 (*)    GFR, Estimated 39 (*)    All other components within normal limits  LACTIC ACID, PLASMA - Abnormal; Notable for the following components:   Lactic Acid, Venous 2.1 (*)    All other components within normal limits  LACTIC ACID, PLASMA  SEDIMENTATION RATE  C-REACTIVE PROTEIN    EKG None  Radiology DG Foot Complete Left  Result Date: 07/23/2021 CLINICAL DATA:  Foot infection EXAM: LEFT FOOT - COMPLETE 3+ VIEW COMPARISON:  None Available. FINDINGS: There is left foot soft tissue swelling. There is no evidence of acute fracture. There is soft tissue irregularity of the first and second toes and medial aspect of the third toe. There is osseous lucency of the second toe distal and middle phalanx. There is moderate first MTP osteoarthritis. Plantar and dorsal calcaneal spurring. Vascular calcifications. IMPRESSION: Soft tissue swelling and  irregularity of the first and third toes with osseous lucency of the second toe distal and middle phalanx which could potentially represent osteomyelitis. Electronically Signed   By: Maurine Simmering M.D.   On: 07/23/2021 16:25   DG Chest 1 View  Result Date: 07/23/2021 CLINICAL DATA:  Fall EXAM: CHEST  1 VIEW COMPARISON:  Radiograph 12/02/2009 FINDINGS: The patient is rotated towards the left.  Borderline enlarged cardiac silhouette. Prior median sternotomy and CABG. There are left basilar opacities. No pleural effusion. No pneumothorax. No acute osseous abnormality on single frontal view of the chest. IMPRESSION: Left basilar opacities which could represent atelectasis. No acute osseous abnormality on single frontal view of the chest. Electronically Signed   By: Maurine Simmering M.D.   On: 07/23/2021 16:23   DG Shoulder Left  Result Date: 07/23/2021 CLINICAL DATA:  Left shoulder pain EXAM: LEFT SHOULDER - 2+ VIEW COMPARISON:  None Available. FINDINGS: There is no evidence of fracture. High-riding humeral head. There is moderate glenohumeral and AC joint osteoarthritis. Subacromial and greater tuberosity irregularity. IMPRESSION: No acute osseous abnormality Moderate glenohumeral and AC joint osteoarthritis. Findings which can be seen with chronic full-thickness cuff tear. Electronically Signed   By: Maurine Simmering M.D.   On: 07/23/2021 16:20   DG Humerus Left  Result Date: 07/23/2021 CLINICAL DATA:  Pain EXAM: LEFT HUMERUS - 2+ VIEW COMPARISON:  None Available. FINDINGS: There is no evidence of acute fracture involving the left humerus. There are degenerative changes of the elbow noted. IMPRESSION: No evidence of acute fracture involving the left humerus. Electronically Signed   By: Maurine Simmering M.D.   On: 07/23/2021 16:19   CT Head Wo Contrast  Result Date: 07/23/2021 CLINICAL DATA:  Neck trauma, dangerous injury mechanism (Age 41-64y); Head trauma, moderate-severe EXAM: CT HEAD WITHOUT CONTRAST CT CERVICAL  SPINE WITHOUT CONTRAST TECHNIQUE: Multidetector CT imaging of the head and cervical spine was performed following the standard protocol without intravenous contrast. Multiplanar CT image reconstructions of the cervical spine were also generated. RADIATION DOSE REDUCTION: This exam was performed according to the departmental dose-optimization program which includes automated exposure control, adjustment of the mA and/or kV according to patient size and/or use of iterative reconstruction technique. COMPARISON:  CTA head/neck July 15, 2008. FINDINGS: CT HEAD FINDINGS Brain: No evidence of acute infarction, hemorrhage, hydrocephalus, extra-axial collection or mass lesion/mass effect. Vascular: No hyperdense vessel identified. Skull: No acute fracture. Sinuses/Orbits: Clear sinuses.  No acute orbital findings. Other: No mastoid effusions. CT CERVICAL SPINE FINDINGS Alignment: Mild anterolisthesis of C4 on C5 and C5 on C6, probably degenerative given degenerative changes at these levels. Otherwise, no substantial sagittal subluxation. Skull base and vertebrae: Vertebral body heights are maintained. No evidence of acute fracture. Soft tissues and spinal canal: No prevertebral fluid or swelling. No visible canal hematoma. Disc levels: Multilevel degenerative change, greatest at C4-C5 where there is potentially moderate canal stenosis and severe bilateral foraminal stenosis. Upper chest: Visualized lung apices are clear. IMPRESSION: 1. No evidence of acute intracranial abnormality. 2. No evidence of acute fracture or traumatic malalignment in the cervical spine. 3. Multilevel degenerative change, greatest at C4-C5 where there is potentially moderate canal stenosis and severe bilateral foraminal stenosis. MRI of the cervical spine could better characterize the canal/cord/foramina if clinically warranted. Electronically Signed   By: Margaretha Sheffield M.D.   On: 07/23/2021 16:03   CT Cervical Spine Wo Contrast  Result Date:  07/23/2021 CLINICAL DATA:  Neck trauma, dangerous injury mechanism (Age 41-64y); Head trauma, moderate-severe EXAM: CT HEAD WITHOUT CONTRAST CT CERVICAL SPINE WITHOUT CONTRAST TECHNIQUE: Multidetector CT imaging of the head and cervical spine was performed following the standard protocol without intravenous contrast. Multiplanar CT image reconstructions of the cervical spine were also generated. RADIATION DOSE REDUCTION: This exam was performed according to the departmental dose-optimization program which includes automated exposure control, adjustment of the mA and/or kV according to patient size  and/or use of iterative reconstruction technique. COMPARISON:  CTA head/neck July 15, 2008. FINDINGS: CT HEAD FINDINGS Brain: No evidence of acute infarction, hemorrhage, hydrocephalus, extra-axial collection or mass lesion/mass effect. Vascular: No hyperdense vessel identified. Skull: No acute fracture. Sinuses/Orbits: Clear sinuses.  No acute orbital findings. Other: No mastoid effusions. CT CERVICAL SPINE FINDINGS Alignment: Mild anterolisthesis of C4 on C5 and C5 on C6, probably degenerative given degenerative changes at these levels. Otherwise, no substantial sagittal subluxation. Skull base and vertebrae: Vertebral body heights are maintained. No evidence of acute fracture. Soft tissues and spinal canal: No prevertebral fluid or swelling. No visible canal hematoma. Disc levels: Multilevel degenerative change, greatest at C4-C5 where there is potentially moderate canal stenosis and severe bilateral foraminal stenosis. Upper chest: Visualized lung apices are clear. IMPRESSION: 1. No evidence of acute intracranial abnormality. 2. No evidence of acute fracture or traumatic malalignment in the cervical spine. 3. Multilevel degenerative change, greatest at C4-C5 where there is potentially moderate canal stenosis and severe bilateral foraminal stenosis. MRI of the cervical spine could better characterize the  canal/cord/foramina if clinically warranted. Electronically Signed   By: Margaretha Sheffield M.D.   On: 07/23/2021 16:03    Procedures .Critical Care  Performed by: Rhae Hammock, PA-C Authorized by: Rhae Hammock, PA-C   Critical care provider statement:    Critical care time (minutes):  120   Critical care time was exclusive of:  Separately billable procedures and treating other patients   Critical care was necessary to treat or prevent imminent or life-threatening deterioration of the following conditions:  Circulatory failure, renal failure and sepsis   Critical care was time spent personally by me on the following activities:  Development of treatment plan with patient or surrogate, discussions with consultants, evaluation of patient's response to treatment, examination of patient, ordering and review of laboratory studies, ordering and review of radiographic studies, ordering and performing treatments and interventions, pulse oximetry, re-evaluation of patient's condition and review of old charts   I assumed direction of critical care for this patient from another provider in my specialty: no     Care discussed with: admitting provider      Medications Ordered in ED Medications  piperacillin-tazobactam (ZOSYN) IVPB 3.375 g (has no administration in time range)  vancomycin (VANCOREADY) IVPB 1500 mg/300 mL (has no administration in time range)  sodium chloride 0.9 % bolus 1,000 mL (1,000 mLs Intravenous New Bag/Given 07/23/21 1535)  oxyCODONE-acetaminophen (PERCOCET/ROXICET) 5-325 MG per tablet 1 tablet (1 tablet Oral Given 07/23/21 1539)  sodium zirconium cyclosilicate (LOKELMA) packet 5 g (5 g Oral Given 07/23/21 1633)    ED Course/ Medical Decision Making/ A&P                           Medical Decision Making Amount and/or Complexity of Data Reviewed Labs: ordered. Radiology: ordered.  Risk Prescription drug management. Decision regarding hospitalization.   This  patient presents to the ED for concern of a fall.  Differential includes but is not limited to seizure, syncope, mechanical fall, hypoglycemia, orthostatic hypotension, CVA and arrhythmia.   This is not an exhaustive differential.    Past Medical History / Co-morbidities / Social History: Elderly, history of paroxysmal A-fib   Additional history: In mid May patient's creatinine was 1.94, GFR was 32 and hemoglobin was 8.4.  On Jun 11, 2021 patient was seen by vascular due to his significant tissue loss on the left leg.  Over  the previous months his ABIs were 0.  An aortogram was performed which showed critical left lower limb ischemia.  He suggested a left leg amputation.   Physical Exam: Patient is without signs of acute lacerations or bruising.  He does have multiple bruises to the upper extremities.  He does have depressed first and second digits of the left foot.  He is also tachycardic.  Lab Tests: I ordered, and personally interpreted labs.  The pertinent results include:  -Lactic 2.1 -Leukocytosis 11.3 -Hemoglobin 7.8 -Potassium 5.3 -Creatinine 1.67, BUN 20, GFR 39 -ESR 100   Imaging Studies: I ordered and independently visualized and interpreted CTs of the head and neck and x-rays of the left upper extremity and left foot which showed likely osteomyelitis of the left foot. I agree with the radiologist interpretation.   Cardiac Monitoring:  The patient was maintained on a cardiac monitor.  Appears to be in sinus tach   Medications: I ordered medication including Percocet, fluids, Lokelma, vanc and Zosyn for pain and osteomyelitis.   Consultations Obtained: I consider consulting vascular however patient continues to decline any intervention on his left lower extremity.  Do not believe consult is indicated since patient is resistant to their suggestions.  Disposition: After consideration of the diagnostic results and the patients response to treatment, I feel that patient  requires admission.  He is anemic with a hemoglobin of 7.8 and symptomatic of this.  X-ray also showed signs of osteomyelitis.  He is afebrile but tachycardic.  Does not fully meet sepsis criteria but is going down that pathway.  Have ordered vancomycin and Zosyn at this time.   I discussed this case with my attending physician Dr. Matilde Sprang who cosigned this note including patient's presenting symptoms, physical exam, and planned diagnostics and interventions. Attending physician stated agreement with plan or made changes to plan which were implemented.      Final Clinical Impression(s) / ED Diagnoses Final diagnoses:  Symptomatic anemia  Osteomyelitis of left foot, unspecified type ([place])  Hyperkalemia    Rx / DC Orders Admit to MD Kellie Simmering, PA-C 07/23/21 1725    Kommor, [place], MD 07/24/21 [phone]

## 2021-07-23 NOTE — Assessment & Plan Note (Addendum)
Hemoglobin 7.8, recent baseline 10-11. Last check 5/4- hemoglobin was 11.  He is on anticoagulation with Xarelto.  He denies any hematochezia or hematemesis.  He reports generalized weakness, dyspnea on exertion with falls-hard to tell 2/2 symptomatic anemia versus sepsis.   -CBC in the morning, likely drop with hydration. -Iron panel -Hold Xarelto for now with acute drop in hemoglobin - Low threshold to transfuse

## 2021-07-23 NOTE — Progress Notes (Addendum)
Pharmacy Antibiotic Note  Scott [name] is a 88 y.o. male admitted on 07/23/2021 with  wound infection- osteo .  Pharmacy has been consulted for vancomycin dosing.  Plan: Vancomycin 1500 mg IV x 1 dose Vancomycin 1250 mg IV every 48 hours. Zosyn 3.375g IV x  1 dose. F/U additional orders. Monitor labs, c/s, and vanco level as indicated.  Height: 5\' 10"  (177.8 cm) Weight: 67.1 kg (148 lb) IBW/kg (Calculated) : 73  Temp (24hrs), Avg:97.5 F (36.4 C), Min:97.5 F (36.4 C), Max:97.5 F (36.4 C)  Recent Labs  Lab 07/23/21 1459 07/23/21 1502  WBC 11.3*  --   CREATININE 1.67*  --   LATICACIDVEN  --  2.1*    Estimated Creatinine Clearance: 27.9 mL/min (A) (by C-G formula based on SCr of 1.67 mg/dL (H)).    No Known Allergies  Antimicrobials this admission: Vanco 6/15 >> Zosyn 6/15   Microbiology results: None pending  Thank you for allowing pharmacy to be a part of this patient's care.  7/15 07/23/2021 5:14 PM  Addendum  Adding cefepime 2g IV q24 for GNR coverage  07/25/2021, PharmD, BCIDP, AAHIVP, CPP Infectious Disease Pharmacist 07/23/2021 8:53 PM

## 2021-07-23 NOTE — Assessment & Plan Note (Addendum)
EKG showing ectopic atrial tachycardia. -Hold Xarelto with acute drop in hemoglobin -Resume metoprolol

## 2021-07-23 NOTE — Assessment & Plan Note (Signed)
Resume metoprolol in am./  

## 2021-07-24 ENCOUNTER — Inpatient Hospital Stay (HOSPITAL_COMMUNITY): Payer: Medicare Other

## 2021-07-24 DIAGNOSIS — D649 Anemia, unspecified: Secondary | ICD-10-CM | POA: Diagnosis not present

## 2021-07-24 DIAGNOSIS — N1832 Chronic kidney disease, stage 3b: Secondary | ICD-10-CM | POA: Diagnosis not present

## 2021-07-24 DIAGNOSIS — A419 Sepsis, unspecified organism: Secondary | ICD-10-CM | POA: Diagnosis not present

## 2021-07-24 DIAGNOSIS — M869 Osteomyelitis, unspecified: Secondary | ICD-10-CM | POA: Diagnosis not present

## 2021-07-24 DIAGNOSIS — R0600 Dyspnea, unspecified: Secondary | ICD-10-CM | POA: Diagnosis not present

## 2021-07-24 DIAGNOSIS — J90 Pleural effusion, not elsewhere classified: Secondary | ICD-10-CM | POA: Diagnosis not present

## 2021-07-24 LAB — CBC
HCT: 19.5 % — ABNORMAL LOW (ref 39.0–52.0)
Hemoglobin: 6.1 g/dL — CL (ref 13.0–17.0)
MCH: 31.8 pg (ref 26.0–34.0)
MCHC: 31.3 g/dL (ref 30.0–36.0)
MCV: 101.6 fL — ABNORMAL HIGH (ref 80.0–100.0)
Platelets: 194 10*3/uL (ref 150–400)
RBC: 1.92 MIL/uL — ABNORMAL LOW (ref 4.22–5.81)
RDW: 15.1 % (ref 11.5–15.5)
WBC: 9.4 10*3/uL (ref 4.0–10.5)
nRBC: 0 % (ref 0.0–0.2)

## 2021-07-24 LAB — BASIC METABOLIC PANEL
Anion gap: 6 (ref 5–15)
BUN: 28 mg/dL — ABNORMAL HIGH (ref 8–23)
CO2: 22 mmol/L (ref 22–32)
Calcium: 8 mg/dL — ABNORMAL LOW (ref 8.9–10.3)
Chloride: 106 mmol/L (ref 98–111)
Creatinine, Ser: 1.66 mg/dL — ABNORMAL HIGH (ref 0.61–1.24)
GFR, Estimated: 39 mL/min — ABNORMAL LOW (ref >60)
Glucose, Bld: 220 mg/dL — ABNORMAL HIGH (ref 70–99)
Potassium: 4.2 mmol/L (ref 3.5–5.1)
Sodium: 134 mmol/L — ABNORMAL LOW (ref 135–145)

## 2021-07-24 LAB — LACTIC ACID, PLASMA: Lactic Acid, Venous: 1.7 mmol/L (ref 0.5–1.9)

## 2021-07-24 LAB — HEMOGLOBIN AND HEMATOCRIT, BLOOD
HCT: 19.8 % — ABNORMAL LOW (ref 39.0–52.0)
Hemoglobin: 5.9 g/dL — CL (ref 13.0–17.0)

## 2021-07-24 LAB — PREPARE RBC (CROSSMATCH)

## 2021-07-24 MED ORDER — SODIUM CHLORIDE 0.9% IV SOLUTION
Freq: Once | INTRAVENOUS | Status: AC
Start: 2021-07-24 — End: 2021-07-24

## 2021-07-24 MED ORDER — FUROSEMIDE 10 MG/ML IJ SOLN: 20.0000 mg | Freq: Once | INTRAMUSCULAR | Status: DC | PRN

## 2021-07-24 MED ORDER — FUROSEMIDE 10 MG/ML IJ SOLN: 20.0000 mg | Freq: Once | INTRAMUSCULAR | Status: DC

## 2021-07-24 MED ORDER — HYDROCODONE-ACETAMINOPHEN 5-325 MG PO TABS: 1.0000 | ORAL_TABLET | Freq: Four times a day (QID) | ORAL | 0 refills | Status: DC | PRN

## 2021-07-24 MED ORDER — METRONIDAZOLE 500 MG/100ML IV SOLN: 500.0000 mg | Freq: Two times a day (BID) | INTRAVENOUS | Status: DC

## 2021-07-24 MED ORDER — METOPROLOL TARTRATE 100 MG PO TABS: 50.0000 mg | ORAL_TABLET | Freq: Two times a day (BID) | ORAL | Status: DC

## 2021-07-24 NOTE — Care Management Obs Status (Signed)
MEDICARE OBSERVATION STATUS NOTIFICATION   Patient Details  Name: Scott [name] MRN: [id number] Date of Birth: 1929-11-20   Medicare Observation Status Notification Given:  Yes    Leitha Bleak, RN 07/24/2021, 10:49 AM

## 2021-07-24 NOTE — TOC Transition Note (Signed)
Transition of Care [facility]) - CM/SW Discharge Note   Patient Details  Name: Scott [name] MRN: [id number] Date of Birth: 1929-03-07  Transition of Care [facility]) CM/SW Contact:  Leitha Bleak, RN Phone Number: 07/24/2021, 11:32 AM   Clinical Narrative:   Patient discharging home today. TOC consulted to refer to home hospice. TOC spoke with patient wife she wanted to wait, however MD has been talking with Son and daughter in law. TOC call [name] answered and explained that they do want him to come home and referral sent to [name]. They will explain to her grandmother. They are aware he will decline and want to prepare now and have plans to keep him comfortable. Referral sent.  TOC called Keka to explain and ask that they call [name]. He is also interested in Healthcare POA. Marylou Flesher will ask Social work and Merchandiser, retail question when she calls.    Final next level of care: Home w Hospice Care Barriers to Discharge: Barriers Resolved   Patient Goals and CMS Choice Patient states their goals for this hospitalization and ongoing recovery are:: to go home. CMS Medicare.gov Compare Post Acute Care list provided to:: Patient Represenative (must comment) Choice offered to / list presented to : Adult Children  Discharge Placement                Patient to be transferred to facility by: Grandson Name of family member notified: Cruz Condon daughter Patient and family notified of of transfer: 07/24/21  Discharge Plan and Services    Home with [facility]            Readmission Risk Interventions    07/24/2021   10:43 AM  Readmission Risk Prevention Plan  Transportation Screening Complete  PCP or Specialist Appt within 5-7 Days Not Complete  Home Care Screening Complete  Medication Review (RN CM) Complete

## 2021-07-24 NOTE — Progress Notes (Signed)
  Transition of Care [facility]) Screening Note   Patient Details  Name: Scott [name] Date of Birth: 1930/02/08   Transition of Care [facility]) CM/SW Contact:    Villa Herb, LCSWA Phone Number: 07/24/2021, 7:10 AM    Transition of Care Department [facility]) has reviewed patient and no TOC needs have been identified at this time. We will continue to monitor patient advancement through interdisciplinary progression rounds. If new patient transition needs arise, please place a TOC consult.

## 2021-07-24 NOTE — Discharge Summary (Signed)
Physician Discharge Summary  Scott [name] XEN:[id number] DOB: Jun 17, 1929 DOA: 07/23/2021  PCP: Sharilyn Sites, MD  Admit date: 07/23/2021 Discharge date: 07/24/2021  Admitted From: Home Disposition: Home  Recommendations for Outpatient Follow-up:  Follow up with PCP in 1 week  Recommend home hospice   Home Health: Possibly home hospice Equipment/Devices: None  Discharge Condition: Stable CODE STATUS: Full Diet recommendation: Heart healthy  Brief/Interim Summary: 88 y.o. male with medical history significant for CABG, CKD 3, paroxysmal atrial fibrillation on chronic anticoagulation, peripheral vascular disease, chronic left foot wound for which lower extremity amputation was offered recently by vascular surgery with the patient had declined presented after a fall.  On presentation, he was tachycardic, tachypneic with leukocytosis, elevated lactic acid and ESR with hemoglobin of 7.8.  Head and cervical spine CT did not show any fractures or intracranial abnormity.  Left shoulder x-ray also did not show any fracture.  Left foot x-ray suggested osteomyelitis of the second toe along with soft tissue swelling and irregularity of the fifth and third toes.  He was started on IV vancomycin and Zosyn.  Subsequently, his repeat hemoglobin this morning was 5.9.  He is being transfused 2 units packed red cells.  Patient again refused any surgical intervention.  Family want to take him home today and continue palliative wound care and is interested in home hospice.  Care management involved.  He will be discharged home today.  Prognosis is very poor.  Discharge Diagnoses:   Sepsis: Present on admission Left foot osteomyelitis/dry gangrene left second toe Chronic nonhealing left foot ulcer Lactic acidosis: Resolved Leukocytosis: Resolved Peripheral arterial disease -Presented with tachycardia, tachypnea with leukocytosis, elevated lactic acid and possible left foot osteomyelitis -He was started on IV  vancomycin and Zosyn. -Left lower extremity arteriogram for critical limb ischemia on 06/11/2021 by vascular surgery: Vascular surgery had offered amputation versus palliative wound care.  He had refused surgical intervention at that time. -Patient again refused any surgical intervention.  Family want to take him home today and continue palliative wound care and is interested in home hospice.  Care management involved.  He will be discharged home today.  Prognosis is very poor. -Patient was on doxycycline prior to presentation will be continued.  Doubt that antibiotics alone will help with his condition  Acute on chronic anemia of chronic disease -Hemoglobin 5.9 this morning.  No signs of bleeding.  Xarelto has been discontinued. -Transfusing 2 units packed red cells.  Afterwards patient will be discharged home.  Will not repeat H&H prior to discharge and family is agreeable  Paroxysmal A-fib -We will discontinue Xarelto.  Continue metoprolol but at a lower dose  CKD stage IIIb -Creatinine at baseline.  Hypertension Continue low-dose metoprolol  Diabetes mellitus type 2 -Continue home regimen  CAD with history of CABG -Currently stable.  Continue statin and metoprolol.  Hypothyroidism-continue levothyroxine  Anxiety/depression--continue Lexapro  Hyperlipidemia -Continue statin   Discharge Instructions  Discharge Instructions     Diet - low sodium heart healthy   Complete by: As directed    Discharge wound care:   Complete by: As directed    As per wound care at home   Increase activity slowly   Complete by: As directed       Allergies as of 07/24/2021   No Known Allergies      Medication List     STOP taking these medications    Xarelto 15 MG Tabs tablet Generic drug: Rivaroxaban  TAKE these medications    acetaminophen 500 MG tablet Commonly known as: TYLENOL Take 500-1,000 mg by mouth 3 (three) times daily as needed (pain.).   atorvastatin 20 MG  tablet Commonly known as: LIPITOR Take 1 tablet (20 mg total) by mouth daily.   cilostazol 50 MG tablet Commonly known as: PLETAL TAKE (2) TABLETS BY MOUTH TWICE DAILY. What changed: See the new instructions.   doxycycline 100 MG tablet Commonly known as: VIBRA-TABS Take 100 mg by mouth 2 (two) times daily.   escitalopram 10 MG tablet Commonly known as: LEXAPRO Take 10 mg by mouth in the morning.   glimepiride 1 MG tablet Commonly known as: AMARYL Take 1 mg by mouth in the morning.   HYDROcodone-acetaminophen 5-325 MG tablet Commonly known as: NORCO/VICODIN Take 1 tablet by mouth every 6 (six) hours as needed for severe pain. What changed: reasons to take this   levothyroxine 112 MCG tablet Commonly known as: SYNTHROID Take 112 mcg by mouth daily before breakfast.   metoprolol tartrate 100 MG tablet Commonly known as: LOPRESSOR Take 0.5 tablets (50 mg total) by mouth 2 (two) times daily. What changed: See the new instructions.   omeprazole 20 MG capsule Commonly known as: PRILOSEC Take 20 mg by mouth daily.   pioglitazone 15 MG tablet Commonly known as: ACTOS Take 15 mg by mouth every evening.               Discharge Care Instructions  (From admission, onward)           Start     Ordered   07/24/21 0000  Discharge wound care:       Comments: As per wound care at home   07/24/21 1019            Follow-up Information     Sharilyn Sites, MD. Schedule an appointment as soon as possible for a visit in 1 week(s).   Specialty: Family Medicine Contact information: [address] [place] [address] [phone]         Arnoldo Lenis, MD .   Specialty: Cardiology Contact information: [address] [place] [address] [phone]                No Known Allergies  Consultations: None   Procedures/Studies: DG Chest 1 View  Result Date: 07/24/2021 CLINICAL DATA:  Dyspnea. EXAM: CHEST  1 VIEW COMPARISON:   Portable chest yesterday at 3:30 p.m. FINDINGS: 4:26 a.m., 07/24/2021. There is increased left lower lobe consolidation small left pleural effusion compared with yesterday's exam. Findings most likely due to pneumonia or aspiration. The aorta is tortuous with patchy calcification, stable mediastinum. The cardiac size is normal. There are old CABG changes. The remainder of the lungs are clear. The right sulci are sharp. Osteopenia and degenerative change dorsal spine. IMPRESSION: Increasing left lower lobe consolidation and small left pleural effusion. Findings most likely due to pneumonia or aspiration. Follow-up films to clearing recommended. Aortic atherosclerosis. Electronically Signed   By: Telford Nab M.D.   On: 07/24/2021 06:44   DG Foot Complete Left  Result Date: 07/23/2021 CLINICAL DATA:  Foot infection EXAM: LEFT FOOT - COMPLETE 3+ VIEW COMPARISON:  None Available. FINDINGS: There is left foot soft tissue swelling. There is no evidence of acute fracture. There is soft tissue irregularity of the first and second toes and medial aspect of the third toe. There is osseous lucency of the second toe distal and middle phalanx. There is moderate first MTP  osteoarthritis. Plantar and dorsal calcaneal spurring. Vascular calcifications. IMPRESSION: Soft tissue swelling and irregularity of the first and third toes with osseous lucency of the second toe distal and middle phalanx which could potentially represent osteomyelitis. Electronically Signed   By: Maurine Simmering M.D.   On: 07/23/2021 16:25   DG Chest 1 View  Result Date: 07/23/2021 CLINICAL DATA:  Fall EXAM: CHEST  1 VIEW COMPARISON:  Radiograph 12/02/2009 FINDINGS: The patient is rotated towards the left. Borderline enlarged cardiac silhouette. Prior median sternotomy and CABG. There are left basilar opacities. No pleural effusion. No pneumothorax. No acute osseous abnormality on single frontal view of the chest. IMPRESSION: Left basilar opacities which  could represent atelectasis. No acute osseous abnormality on single frontal view of the chest. Electronically Signed   By: Maurine Simmering M.D.   On: 07/23/2021 16:23   DG Shoulder Left  Result Date: 07/23/2021 CLINICAL DATA:  Left shoulder pain EXAM: LEFT SHOULDER - 2+ VIEW COMPARISON:  None Available. FINDINGS: There is no evidence of fracture. High-riding humeral head. There is moderate glenohumeral and AC joint osteoarthritis. Subacromial and greater tuberosity irregularity. IMPRESSION: No acute osseous abnormality Moderate glenohumeral and AC joint osteoarthritis. Findings which can be seen with chronic full-thickness cuff tear. Electronically Signed   By: Maurine Simmering M.D.   On: 07/23/2021 16:20   DG Humerus Left  Result Date: 07/23/2021 CLINICAL DATA:  Pain EXAM: LEFT HUMERUS - 2+ VIEW COMPARISON:  None Available. FINDINGS: There is no evidence of acute fracture involving the left humerus. There are degenerative changes of the elbow noted. IMPRESSION: No evidence of acute fracture involving the left humerus. Electronically Signed   By: Maurine Simmering M.D.   On: 07/23/2021 16:19   CT Head Wo Contrast  Result Date: 07/23/2021 CLINICAL DATA:  Neck trauma, dangerous injury mechanism (Age 4-64y); Head trauma, moderate-severe EXAM: CT HEAD WITHOUT CONTRAST CT CERVICAL SPINE WITHOUT CONTRAST TECHNIQUE: Multidetector CT imaging of the head and cervical spine was performed following the standard protocol without intravenous contrast. Multiplanar CT image reconstructions of the cervical spine were also generated. RADIATION DOSE REDUCTION: This exam was performed according to the departmental dose-optimization program which includes automated exposure control, adjustment of the mA and/or kV according to patient size and/or use of iterative reconstruction technique. COMPARISON:  CTA head/neck July 15, 2008. FINDINGS: CT HEAD FINDINGS Brain: No evidence of acute infarction, hemorrhage, hydrocephalus, extra-axial  collection or mass lesion/mass effect. Vascular: No hyperdense vessel identified. Skull: No acute fracture. Sinuses/Orbits: Clear sinuses.  No acute orbital findings. Other: No mastoid effusions. CT CERVICAL SPINE FINDINGS Alignment: Mild anterolisthesis of C4 on C5 and C5 on C6, probably degenerative given degenerative changes at these levels. Otherwise, no substantial sagittal subluxation. Skull base and vertebrae: Vertebral body heights are maintained. No evidence of acute fracture. Soft tissues and spinal canal: No prevertebral fluid or swelling. No visible canal hematoma. Disc levels: Multilevel degenerative change, greatest at C4-C5 where there is potentially moderate canal stenosis and severe bilateral foraminal stenosis. Upper chest: Visualized lung apices are clear. IMPRESSION: 1. No evidence of acute intracranial abnormality. 2. No evidence of acute fracture or traumatic malalignment in the cervical spine. 3. Multilevel degenerative change, greatest at C4-C5 where there is potentially moderate canal stenosis and severe bilateral foraminal stenosis. MRI of the cervical spine could better characterize the canal/cord/foramina if clinically warranted. Electronically Signed   By: Margaretha Sheffield M.D.   On: 07/23/2021 16:03   CT Cervical Spine Wo Contrast  Result Date: 07/23/2021  CLINICAL DATA:  Neck trauma, dangerous injury mechanism (Age 64-64y); Head trauma, moderate-severe EXAM: CT HEAD WITHOUT CONTRAST CT CERVICAL SPINE WITHOUT CONTRAST TECHNIQUE: Multidetector CT imaging of the head and cervical spine was performed following the standard protocol without intravenous contrast. Multiplanar CT image reconstructions of the cervical spine were also generated. RADIATION DOSE REDUCTION: This exam was performed according to the departmental dose-optimization program which includes automated exposure control, adjustment of the mA and/or kV according to patient size and/or use of iterative reconstruction  technique. COMPARISON:  CTA head/neck July 15, 2008. FINDINGS: CT HEAD FINDINGS Brain: No evidence of acute infarction, hemorrhage, hydrocephalus, extra-axial collection or mass lesion/mass effect. Vascular: No hyperdense vessel identified. Skull: No acute fracture. Sinuses/Orbits: Clear sinuses.  No acute orbital findings. Other: No mastoid effusions. CT CERVICAL SPINE FINDINGS Alignment: Mild anterolisthesis of C4 on C5 and C5 on C6, probably degenerative given degenerative changes at these levels. Otherwise, no substantial sagittal subluxation. Skull base and vertebrae: Vertebral body heights are maintained. No evidence of acute fracture. Soft tissues and spinal canal: No prevertebral fluid or swelling. No visible canal hematoma. Disc levels: Multilevel degenerative change, greatest at C4-C5 where there is potentially moderate canal stenosis and severe bilateral foraminal stenosis. Upper chest: Visualized lung apices are clear. IMPRESSION: 1. No evidence of acute intracranial abnormality. 2. No evidence of acute fracture or traumatic malalignment in the cervical spine. 3. Multilevel degenerative change, greatest at C4-C5 where there is potentially moderate canal stenosis and severe bilateral foraminal stenosis. MRI of the cervical spine could better characterize the canal/cord/foramina if clinically warranted. Electronically Signed   By: Margaretha Sheffield M.D.   On: 07/23/2021 16:03      Subjective: Patient seen and examined at bedside.  Patient does not want any surgical intervention.  Denies any headache, nausea or vomiting or fever.  Poor historian.  Spoke to daughter-in-law Lattie Haw and her husband on phone regarding plan of care  Discharge Exam: Vitals:   07/24/21 0952 07/24/21 1012  BP: 123/61 128/76  Pulse: (!) 110 (!) 110  Resp: 18 19  Temp: 98 F (36.7 C) 97.9 F (36.6 C)  SpO2: 99% 100%    General: Pt is awake, chronically ill looking elderly gentleman lying in bed.  Slow to respond.  Poor  historian.  On room air currently.   Cardiovascular: Tachycardic, S1/S2 + Respiratory: bilateral decreased breath sounds at bases Abdominal: Soft, NT, ND, bowel sounds + Extremities: no edema, no cyanosis Skin: Dry appearing gangrene to second toe of left foot, with dark purplish almost black discoloration to first and third toes also, with wounds on ventral creases of both toes.  Erythema extending towards ankle of left foot.     The results of significant diagnostics from this hospitalization (including imaging, microbiology, ancillary and laboratory) are listed below for reference.     Microbiology: No results found for this or any previous visit (from the past 240 hour(s)).   Labs: BNP (last 3 results) No results for input(s): "BNP" in the last 8760 hours. Basic Metabolic Panel: Recent Labs  Lab 07/23/21 1459 07/24/21 0017  NA 132* 134*  K 5.3* 4.2  CL 101 106  CO2 23 22  GLUCOSE 218* 220*  BUN 28* 28*  CREATININE 1.67* 1.66*  CALCIUM 8.9 8.0*  MG 1.5*  --    Liver Function Tests: No results for input(s): "AST", "ALT", "ALKPHOS", "BILITOT", "PROT", "ALBUMIN" in the last 168 hours. No results for input(s): "LIPASE", "AMYLASE" in the last 168 hours. No results  for input(s): "AMMONIA" in the last 168 hours. CBC: Recent Labs  Lab 07/23/21 1459 07/24/21 0017 07/24/21 0140  WBC 11.3* 9.4  --   NEUTROABS 10.1*  --   --   HGB 7.8* 6.1* 5.9*  HCT 25.1* 19.5* 19.8*  MCV 100.0 101.6*  --   PLT 278 194  --    Cardiac Enzymes: No results for input(s): "CKTOTAL", "CKMB", "CKMBINDEX", "TROPONINI" in the last 168 hours. BNP: Invalid input(s): "POCBNP" CBG: No results for input(s): "GLUCAP" in the last 168 hours. D-Dimer No results for input(s): "DDIMER" in the last 72 hours. Hgb A1c No results for input(s): "HGBA1C" in the last 72 hours. Lipid Profile No results for input(s): "CHOL", "HDL", "LDLCALC", "TRIG", "CHOLHDL", "LDLDIRECT" in the last 72 hours. Thyroid  function studies No results for input(s): "TSH", "T4TOTAL", "T3FREE", "THYROIDAB" in the last 72 hours.  Invalid input(s): "FREET3" Anemia work up No results for input(s): "VITAMINB12", "FOLATE", "FERRITIN", "TIBC", "IRON", "RETICCTPCT" in the last 72 hours. Urinalysis    Component Value Date/Time   COLORURINE YELLOW 07/14/2008 0420   APPEARANCEUR CLEAR 07/14/2008 0420   LABSPEC 1.014 07/14/2008 0420   PHURINE 6.0 07/14/2008 0420   GLUCOSEU NEGATIVE 07/14/2008 0420   HGBUR NEGATIVE 07/14/2008 0420   BILIRUBINUR NEGATIVE 07/14/2008 0420   KETONESUR NEGATIVE 07/14/2008 0420   PROTEINUR NEGATIVE 07/14/2008 0420   UROBILINOGEN 1.0 07/14/2008 0420   NITRITE POSITIVE (A) 07/14/2008 0420   LEUKOCYTESUR NEGATIVE 07/14/2008 0420   Sepsis Labs Recent Labs  Lab 07/23/21 1459 07/24/21 0017  WBC 11.3* 9.4   Microbiology No results found for this or any previous visit (from the past 240 hour(s)).   Time coordinating discharge: 35 minutes  SIGNED:   Aline August, MD  [facility] 07/24/2021, 11:32 AM

## 2021-07-24 NOTE — Care Management CC44 (Signed)
Condition Code 44 Documentation Completed  Patient Details  Name: KEALAN BUCHAN MRN: [id number] Date of Birth: [birth year]   Condition Code 44 given:  Yes Patient signature on Condition Code 44 notice:  Yes Documentation of 2 MD's agreement:  Yes Code 44 added to claim:  Yes    Leitha Bleak, RN 07/24/2021, 10:49 AM

## 2021-07-24 NOTE — Progress Notes (Signed)
   07/24/21 0234  Provider Notification  Provider Name/Title Dr. Thomes Dinning  Date Provider Notified 07/24/21  Time Provider Notified 0234  Method of Notification  (secure chat)  Notification Reason Critical result  Test performed and critical result hemglobin 5.9  Date Critical Result Received 07/24/21  Time Critical Result Received 0228

## 2021-07-24 NOTE — Progress Notes (Signed)
Dr. Thomes Dinning notified pt has audible rhonchi during respirations. Blood infusing, Vital signs stable. O2 sat 100% on 2L of oxygen. Chest xray ordered by provider. Will continue to monitor closely.

## 2021-07-24 NOTE — Consult Note (Signed)
WOC Nurse Consult Note: Reason for Consult:Patient with known gangrene to the left foot, second digit with ischemic changes to both the left great toe and left foot, 3rd digit. Followed by Vascular surgery (Dr Early) who has offered patient an amputation and this was refused. Patient with sepsis likely related to left foot. See photo uploaded to EHR upon admission. Wound type: arterial insufficiency, infection Pressure Injury POA: N/A Measurement:N/A.  Three toes with necrosis on left foot Wound bed:N/A Drainage (amount, consistency, odor) Small amount of serosanguinous exudate in the interdigital space between great toe and 1st digit Periwound: dry, peeling Dressing procedure/placement/frequency:Conservative care measures have been provided for Nursing in the care of the left foot including daily soap and water cleanse, rinse and thorough drying-particularly between the digits. This is to be followed by placement of dry folded gauze placed interdigitally. The heels are to be off loaded using pressure redistribution heel boots and a sacral foam placed for PI prevention.  Recommend consultation with Vascular surgery or podiatric medicine for continued discussions regarding POC up to and again including surgery. If you agree, please arrange consult.   WOC nursing team will not follow, but will remain available to this patient, the nursing and medical teams.  Please re-consult if needed.  Thank you for inviting [place] to participate in this patient's Plan of Care.  Ladona Mow, MSN, RN, CNS, GNP, Leda Min, [employer], [employer] phone:  [phone]

## 2021-07-24 NOTE — Progress Notes (Signed)
   07/24/21 0234  Provider Notification  Provider Name/Title Dr. Thomes Dinning  Date Provider Notified 07/24/21  Time Provider Notified 0234  Method of Notification  (secure chat)  Notification Reason Critical result  Test performed and critical result hemglobin 5.9  Date Critical Result Received 07/24/21  Time Critical Result Received 0228  Provider response See new orders  Date of Provider Response 07/24/21  Time of Provider Response [phone]

## 2021-07-24 NOTE — Progress Notes (Addendum)
RN called due to patient's hemoglobin trending down from 7.8 on admission yesterday to 6.1.  H/H was repeated and was 5.9. ??  Hemodilution.  No obvious sign of bleeding, patient was in no acute distress Type and screen was done and 2 units of blood ordered for transfusion IV Lasix 20 mg x1 ordered to be given prior to starting the second infusion

## 2021-07-25 LAB — BPAM RBC
Blood Product Expiration Date: 202307132359
Blood Product Expiration Date: 202307132359
ISSUE DATE / TIME: 202306160328
ISSUE DATE / TIME: 202306160944
Unit Type and Rh: 6200
Unit Type and Rh: 6200

## 2021-07-25 LAB — TYPE AND SCREEN
ABO/RH(D): A POS
Antibody Screen: NEGATIVE
Unit division: 0
Unit division: 0

## 2021-07-28 ENCOUNTER — Encounter: Payer: Medicare Other | Admitting: Vascular Surgery

## 2021-09-08 DIAGNOSIS — I739 Peripheral vascular disease, unspecified: Secondary | ICD-10-CM | POA: Diagnosis not present

## 2021-09-08 DIAGNOSIS — E1151 Type 2 diabetes mellitus with diabetic peripheral angiopathy without gangrene: Secondary | ICD-10-CM | POA: Diagnosis not present

## 2021-09-08 DIAGNOSIS — I96 Gangrene, not elsewhere classified: Secondary | ICD-10-CM | POA: Diagnosis not present

## 2021-09-21 ENCOUNTER — Other Ambulatory Visit: Payer: Self-pay | Admitting: Cardiology

## 2021-09-23 ENCOUNTER — Telehealth: Payer: Self-pay | Admitting: Cardiology

## 2021-09-23 DIAGNOSIS — E1151 Type 2 diabetes mellitus with diabetic peripheral angiopathy without gangrene: Secondary | ICD-10-CM | POA: Diagnosis not present

## 2021-09-23 DIAGNOSIS — I96 Gangrene, not elsewhere classified: Secondary | ICD-10-CM | POA: Diagnosis not present

## 2021-09-23 DIAGNOSIS — I739 Peripheral vascular disease, unspecified: Secondary | ICD-10-CM | POA: Diagnosis not present

## 2021-09-23 DIAGNOSIS — L97524 Non-pressure chronic ulcer of other part of left foot with necrosis of bone: Secondary | ICD-10-CM | POA: Diagnosis not present

## 2021-09-23 DIAGNOSIS — Z792 Long term (current) use of antibiotics: Secondary | ICD-10-CM | POA: Diagnosis not present

## 2021-09-23 DIAGNOSIS — Z7984 Long term (current) use of oral hypoglycemic drugs: Secondary | ICD-10-CM | POA: Diagnosis not present

## 2021-09-23 DIAGNOSIS — L89151 Pressure ulcer of sacral region, stage 1: Secondary | ICD-10-CM | POA: Diagnosis not present

## 2021-09-23 DIAGNOSIS — E11621 Type 2 diabetes mellitus with foot ulcer: Secondary | ICD-10-CM | POA: Diagnosis not present

## 2021-09-23 NOTE — Telephone Encounter (Signed)
   Pt c/o medication issue:  1. Name of Medication: cilostazol (PLETAL) 50 MG tablet  escitalopram (LEXAPRO) 10 MG tablet Xarelto   2. How are you currently taking this medication (dosage and times per day)?   3. Are you having a reaction (difficulty breathing--STAT)?   4. What is your medication issue? Marisue Ivan from [facility] called stating that xarelto and cilstazol have a level 2 drug interaction. She also stated that lexapro and cilostazol have a level 2 drug interaction. Do not see xarelto on med list. Please advise.

## 2021-09-23 NOTE — Telephone Encounter (Signed)
Spoke with Marisue Ivan and informed that pt was to stop taking Xarelto according to D/C summary 07/23/21. She states that there is a level 2 drug interaction with Lexapro and Pletal. Please advise.

## 2021-09-24 NOTE — Telephone Encounter (Signed)
Returned call to Marisue Ivan to notify of Dr. Verna Czech note. No answer, left msg to call back.

## 2021-09-24 NOTE — Telephone Encounter (Signed)
From the noted xarelto was stopped due to severe anemia during the admission that required a blood transfusion. Would need a f/u to discuss possibly restarting. Can she see me or PA 3 weeks  [name] MD

## 2021-09-24 NOTE — Telephone Encounter (Signed)
Spoke with pt and daughter in-law. Pt is to stop xarelto until next visit.

## 2021-09-24 NOTE — Telephone Encounter (Signed)
Spoke with Scott [name] who has spoken with family who states that they were not informed to stop the Xarelto. The family would like to know the reason the Xarelto was stopped. Please advise.

## 2021-10-01 DIAGNOSIS — Z515 Encounter for palliative care: Secondary | ICD-10-CM | POA: Diagnosis not present

## 2021-10-01 DIAGNOSIS — M86272 Subacute osteomyelitis, left ankle and foot: Secondary | ICD-10-CM | POA: Diagnosis not present

## 2021-10-01 DIAGNOSIS — A480 Gas gangrene: Secondary | ICD-10-CM | POA: Diagnosis not present

## 2021-10-01 DIAGNOSIS — I739 Peripheral vascular disease, unspecified: Secondary | ICD-10-CM | POA: Diagnosis not present

## 2021-10-05 DIAGNOSIS — E1151 Type 2 diabetes mellitus with diabetic peripheral angiopathy without gangrene: Secondary | ICD-10-CM | POA: Diagnosis not present

## 2021-10-05 DIAGNOSIS — E11621 Type 2 diabetes mellitus with foot ulcer: Secondary | ICD-10-CM | POA: Diagnosis not present

## 2021-10-05 DIAGNOSIS — I96 Gangrene, not elsewhere classified: Secondary | ICD-10-CM | POA: Diagnosis not present

## 2021-10-05 DIAGNOSIS — L97524 Non-pressure chronic ulcer of other part of left foot with necrosis of bone: Secondary | ICD-10-CM | POA: Diagnosis not present

## 2021-10-05 DIAGNOSIS — I739 Peripheral vascular disease, unspecified: Secondary | ICD-10-CM | POA: Diagnosis not present

## 2021-10-05 DIAGNOSIS — Z792 Long term (current) use of antibiotics: Secondary | ICD-10-CM | POA: Diagnosis not present

## 2021-10-14 NOTE — Progress Notes (Addendum)
Virtual Visit via Video Note   Because of Scott [name]'s co-morbid illnesses, he is at least at moderate risk for complications without adequate follow up.  This format is felt to be most appropriate for this patient at this time.  All issues noted in this document were discussed and addressed.  A limited physical exam was performed with this format.  Please refer to the patient's chart for his consent to telehealth for [facility].       Date:  10/19/2021   ID:  Scott [name], DOB October 28, 1929, MRN [id number] The patient was identified using 2 identifiers.  Patient Location: Home Provider Location: Office/Clinic   PCP:  Assunta Found, MD   [facility] Providers Cardiologist:  Dina Rich, MD     Evaluation Performed:  Follow-Up Visit  Chief Complaint:  hospital f/u  History of Present Illness:    Scott [name] is a 88 y.o. male  with history of CAD status post CABG 2010 with LIMA to the LAD, SVG to the OM, SVG to the diagonal and SVG to the RCA, hypertension, HLD, carotid stenosis, renal artery stenosis status post left renal artery stent, atrial flutter on Xarelto, PAD with right popliteal intervention in the past.  Patient last saw Dr. Wyline Mood 08/14/2020 and was doing well.  Patient had left lower ext arteriogram for critical limb ischemia 06/11/21 and was in need of left leg amputations vs palliative wound care.   I saw the patient 06/15/21 and didn't want an amputation. Bp was running low and taking 1/2 dose losartan/HCTZ. I stopped Hyzaar completely and he was off amlodipine. I restarted lipitor.   Patient discharged 07/24/21 with sepsis due to left foot osteomyelitis/dry gangrene. Patient continued to refuse amputatation.  Hgb 5.9 and transfused. Xarelto stopped.   Patient is on video visit with his grand daughter. He's under palliative care and sits in his chair only using the bathroom about once a day. Has wound care coming.  He denies chest pain,  dyspnea, palpitations. When he went home they didn't have a discharge summary and was on xarelto at first but now is off. Most recent Hgb 7.9 per grand daughter.   Past Medical History:  Diagnosis Date   Chronic anticoagulation    Chronic renal insufficiency, stage III (moderate) (HCC)    Coronary artery disease 2010   CABG   Dyslipidemia    Hypertension 12/26/09   renal doppler   Hypothyroidism    PAF (paroxysmal atrial fibrillation) (HCC)    Peripheral vascular disease (HCC) 08/01/10   Carotid doppler: 12/26/09 lower arterial doppler   RBBB    Shingles April 2012   Past Surgical History:  Procedure Laterality Date   ABDOMINAL AORTOGRAM W/LOWER EXTREMITY N/A 06/11/2021   Procedure: ABDOMINAL AORTOGRAM W/LOWER EXTREMITY;  Surgeon: Cephus Shelling, MD;  [facility];  Service: Cardiovascular;  Laterality: N/A;   CARDIAC CATHETERIZATION  6/10   CORONARY ARTERY BYPASS GRAFT  2010   3-vessel by-pass   HERNIA REPAIR  3/09   POPLITEAL ARTERY ANGIOPLASTY Right    PROSTATE SURGERY  11/99   RENAL ARTERY ANGIOPLASTY Left 9/11     Current Meds  Medication Sig   acetaminophen (TYLENOL) 500 MG tablet Take 500-1,000 mg by mouth 3 (three) times daily as needed (pain.).   atorvastatin (LIPITOR) 20 MG tablet Take 1 tablet (20 mg total) by mouth daily. (Patient taking differently: Take 20 mg by mouth every other day.)   cilostazol (PLETAL)  50 MG tablet TAKE (2) TABLETS BY MOUTH TWICE DAILY. (Patient taking differently: Take 100 mg by mouth 2 (two) times daily. TAKE (2) TABLETS BY MOUTH TWICE DAILY.)   doxycycline (VIBRA-TABS) 100 MG tablet Take 100 mg by mouth 2 (two) times daily.   escitalopram (LEXAPRO) 10 MG tablet Take 10 mg by mouth in the morning.   glimepiride (AMARYL) 1 MG tablet Take 1 mg by mouth in the morning.   HYDROcodone-acetaminophen (NORCO/VICODIN) 5-325 MG tablet Take 1 tablet by mouth every 6 (six) hours as needed for severe pain.   levothyroxine  (SYNTHROID) 112 MCG tablet Take 112 mcg by mouth daily before breakfast.    metoprolol tartrate (LOPRESSOR) 100 MG tablet TAKE (1) TABLET BY MOUTH TWICE DAILY.   omeprazole (PRILOSEC) 20 MG capsule Take 20 mg by mouth daily.   pioglitazone (ACTOS) 15 MG tablet Take 15 mg by mouth every evening.     Allergies:   Patient has no known allergies.   Social History   Tobacco Use   Smoking status: Former    Packs/day: 1.00    Types: Cigarettes    Quit date: 07/28/2008    Years since quitting: 13.2   Smokeless tobacco: Never  Vaping Use   Vaping Use: Never used  Substance Use Topics   Alcohol use: No    Alcohol/week: 0.0 standard drinks of alcohol   Drug use: Never     Family Hx: The patient's family history includes CAD in his brother; CVA (age of onset: 71) in his mother; Diabetes in his father; Stroke in his mother.  ROS:   Please see the history of present illness.      All other systems reviewed and are negative.   Prior CV studies:   The following studies were reviewed today:   09/2018 carotid US IMPRESSION: 1. Chronic occlusion of the left internal carotid artery. 2. Mild (1-49%) stenosis proximal right internal carotid artery secondary to mild heterogeneous atherosclerotic plaque. 3. Nonvisualization of the right vertebral artery which may be chronically occluded or congenitally hypoplastic. 4. Patent dominant left vertebral artery with normal antegrade flow.     05/2012 echo Study Conclusions   - Left ventricle: The cavity size was normal. There was mild    focal basal hypertrophy of the septum. Systolic function    was normal. The estimated ejection fraction was in the    range of 55% to 60%. Doppler parameters are consistent    with abnormal left ventricular relaxation (grade 1    diastolic dysfunction). The E/e' ratio is <10, suggesting    normal LV filling pressure.  - Aortic valve: Sclerosis without stenosis. Trivial    regurgitation.  - Mitral valve:  Mildly thickened anterior mitral leaflet    with late systolic prolapse. Mild regurgitation. Valve    area by pressure half-time: 1.65cm^2.  - Left atrium: LA Volume/ BSA = 29.7 ml/m2 The atrium was    mildly dilated.  - Atrial septum: No defect or patent foramen ovale was    identified.  - Pulmonary arteries: PA peak pressure: 91mm Hg (S).  - Inferior vena cava: The vessel was normal in size; the    respirophasic diameter changes were in the normal range (=    50%); findings are consistent with normal central venous    pressure.       Labs/Other Tests and Data Reviewed:    EKG:  No ECG reviewed.  Recent Labs: 05/19/2021: ALT 11 07/23/2021: Magnesium 1.5 07/24/2021: BUN 28; Creatinine,  Ser 1.66; Hemoglobin 5.9; Platelets 194; Potassium 4.2; Sodium 134   Recent Lipid Panel Lab Results  Component Value Date/Time   CHOL 85 (L) 08/02/2016 08:40 AM   TRIG 63 08/02/2016 08:40 AM   HDL 37 (L) 08/02/2016 08:40 AM   CHOLHDL 2.3 08/02/2016 08:40 AM   CHOLHDL 2.2 08/08/2013 09:09 AM   LDLCALC 35 08/02/2016 08:40 AM    Wt Readings from Last 3 Encounters:  07/23/21 148 lb (67.1 kg)  06/15/21 146 lb 6.4 oz (66.4 kg)  06/11/21 149 lb (67.6 kg)     Risk Assessment/Calculations:    CHA2DS2-VASc Score = 4   This indicates a 4.8% annual risk of stroke. The patient's score is based upon: CHF History: 0 HTN History: 1 Diabetes History: 0 Stroke History: 0 Vascular Disease History: 1 Age Score: 2 Gender Score: 0         Objective:    Vital Signs:  BP 110/70   Pulse 98   Temp (!) 97.5 F (36.4 C)   Ht 5\' 10"  (1.778 m)   BMI 21.24 kg/m    VITAL SIGNS:  reviewed GEN:  no acute distress EYES:  sclerae anicteric, EOMI - Extraocular Movements Intact RESPIRATORY:  normal respiratory effort, symmetric expansion CARDIOVASCULAR:  no peripheral edema  ASSESSMENT & PLAN:    CAD status post CABG in 2010-no angina   Hypertension BP running low, now off amlodipine and   Hyzaar.Still on metoprolol and BP improved.   Hyperlipidemia-has been off lipitor for awhile because of pharmacy mix up. Restart lipitor.    Carotid stenosis total LICA, 1 to [id number] R ICA   History of renal artery stenosis prior left renal artery stent   History of atrial flutter previously on Xarelto-hgb was 5.9 in setting of sepsis requiring transfusion 07/2021. Xarelto stopped.  Most recent Hgb 7.9 per grand daughter. On metoprolol 100 mg bid. BP currently stable. HR 98. Continue current dose and they will monitor.   PAD status post right popliteal intervention- now with critical limb ischemia of LL ext with tissue loss. Patient has chosen not to have an amputation.       Addendum: This visit was done via telehealth via video visit        Time:   Today, I have spent 13 minutes with the patient with telehealth technology discussing the above problems.     Medication Adjustments/Labs and Tests Ordered: Current medicines are reviewed at length with the patient today.  Concerns regarding medicines are outlined above.   Tests Ordered: No orders of the defined types were placed in this encounter.   Medication Changes: No orders of the defined types were placed in this encounter.   Follow Up:  Virtual Visit  in 4 month(s)  Signed, Ermalinda Barrios, PA-C  10/19/2021 1:55 PM    [employer]

## 2021-10-15 DIAGNOSIS — I739 Peripheral vascular disease, unspecified: Secondary | ICD-10-CM | POA: Diagnosis not present

## 2021-10-15 DIAGNOSIS — I96 Gangrene, not elsewhere classified: Secondary | ICD-10-CM | POA: Diagnosis not present

## 2021-10-15 DIAGNOSIS — L97524 Non-pressure chronic ulcer of other part of left foot with necrosis of bone: Secondary | ICD-10-CM | POA: Diagnosis not present

## 2021-10-15 DIAGNOSIS — E11621 Type 2 diabetes mellitus with foot ulcer: Secondary | ICD-10-CM | POA: Diagnosis not present

## 2021-10-15 DIAGNOSIS — E1151 Type 2 diabetes mellitus with diabetic peripheral angiopathy without gangrene: Secondary | ICD-10-CM | POA: Diagnosis not present

## 2021-10-15 DIAGNOSIS — Z792 Long term (current) use of antibiotics: Secondary | ICD-10-CM | POA: Diagnosis not present

## 2021-10-19 ENCOUNTER — Encounter: Payer: Self-pay | Admitting: Physician Assistant

## 2021-10-19 ENCOUNTER — Ambulatory Visit: Payer: Medicare Other | Attending: Physician Assistant | Admitting: Physician Assistant

## 2021-10-19 ENCOUNTER — Telehealth: Payer: Self-pay

## 2021-10-19 VITALS — BP 110/70 | HR 98 | Temp 97.5°F | Ht 70.0 in

## 2021-10-19 DIAGNOSIS — I25810 Atherosclerosis of coronary artery bypass graft(s) without angina pectoris: Secondary | ICD-10-CM | POA: Diagnosis not present

## 2021-10-19 DIAGNOSIS — I6523 Occlusion and stenosis of bilateral carotid arteries: Secondary | ICD-10-CM | POA: Diagnosis not present

## 2021-10-19 DIAGNOSIS — I739 Peripheral vascular disease, unspecified: Secondary | ICD-10-CM | POA: Diagnosis not present

## 2021-10-19 DIAGNOSIS — I10 Essential (primary) hypertension: Secondary | ICD-10-CM | POA: Diagnosis not present

## 2021-10-19 DIAGNOSIS — I4892 Unspecified atrial flutter: Secondary | ICD-10-CM

## 2021-10-19 DIAGNOSIS — E785 Hyperlipidemia, unspecified: Secondary | ICD-10-CM | POA: Diagnosis not present

## 2021-10-19 NOTE — Telephone Encounter (Signed)
  Patient Consent for Virtual Visit        Scott [name] has provided verbal consent on 10/19/2021 for a virtual visit (video or telephone).   CONSENT FOR VIRTUAL VISIT FOR:  Scott [name]  By participating in this virtual visit I agree to the following:  I hereby voluntarily request, consent and authorize [facility] and its employed or contracted physicians, physician assistants, nurse practitioners or other licensed health care professionals (the Practitioner), to provide me with telemedicine health care services (the "Services") as deemed necessary by the treating Practitioner. I acknowledge and consent to receive the Services by the Practitioner via telemedicine. I understand that the telemedicine visit will involve communicating with the Practitioner through live audiovisual communication technology and the disclosure of certain medical information by electronic transmission. I acknowledge that I have been given the opportunity to request an in-person assessment or other available alternative prior to the telemedicine visit and am voluntarily participating in the telemedicine visit.  I understand that I have the right to withhold or withdraw my consent to the use of telemedicine in the course of my care at any time, without affecting my right to future care or treatment, and that the Practitioner or I may terminate the telemedicine visit at any time. I understand that I have the right to inspect all information obtained and/or recorded in the course of the telemedicine visit and may receive copies of available information for a reasonable fee.  I understand that some of the potential risks of receiving the Services via telemedicine include:  Delay or interruption in medical evaluation due to technological equipment failure or disruption; Information transmitted may not be sufficient (e.g. poor resolution of images) to allow for appropriate medical decision making by the Practitioner;  and/or  In rare instances, security protocols could fail, causing a breach of personal health information.  Furthermore, I acknowledge that it is my responsibility to provide information about my medical history, conditions and care that is complete and accurate to the best of my ability. I acknowledge that Practitioner's advice, recommendations, and/or decision may be based on factors not within their control, such as incomplete or inaccurate data provided by me or distortions of diagnostic images or specimens that may result from electronic transmissions. I understand that the practice of medicine is not an exact science and that Practitioner makes no warranties or guarantees regarding treatment outcomes. I acknowledge that a copy of this consent can be made available to me via my patient portal [facility] MyChart), or I can request a printed copy by calling the office of [facility].    I understand that my insurance will be billed for this visit.   I have read or had this consent read to me. I understand the contents of this consent, which adequately explains the benefits and risks of the Services being provided via telemedicine.  I have been provided ample opportunity to ask questions regarding this consent and the Services and have had my questions answered to my satisfaction. I give my informed consent for the services to be provided through the use of telemedicine in my medical care

## 2021-10-19 NOTE — Patient Instructions (Signed)
Medication Instructions:  Your physician recommends that you continue on your current medications as directed. Please refer to the Current Medication list given to you today.   Labwork: None today  Testing/Procedures: None today  Follow-Up: 4 months Video Visit  Any Other Special Instructions Will Be Listed Below (If Applicable).  If you need a refill on your cardiac medications before your next appointment, please call your pharmacy.

## 2021-10-20 DIAGNOSIS — E119 Type 2 diabetes mellitus without complications: Secondary | ICD-10-CM | POA: Diagnosis not present

## 2021-10-21 DIAGNOSIS — E1151 Type 2 diabetes mellitus with diabetic peripheral angiopathy without gangrene: Secondary | ICD-10-CM | POA: Diagnosis not present

## 2021-10-21 DIAGNOSIS — E11621 Type 2 diabetes mellitus with foot ulcer: Secondary | ICD-10-CM | POA: Diagnosis not present

## 2021-10-21 DIAGNOSIS — L97524 Non-pressure chronic ulcer of other part of left foot with necrosis of bone: Secondary | ICD-10-CM | POA: Diagnosis not present

## 2021-10-21 DIAGNOSIS — Z792 Long term (current) use of antibiotics: Secondary | ICD-10-CM | POA: Diagnosis not present

## 2021-10-21 DIAGNOSIS — I96 Gangrene, not elsewhere classified: Secondary | ICD-10-CM | POA: Diagnosis not present

## 2021-10-21 DIAGNOSIS — I739 Peripheral vascular disease, unspecified: Secondary | ICD-10-CM | POA: Diagnosis not present

## 2021-10-23 DIAGNOSIS — L97524 Non-pressure chronic ulcer of other part of left foot with necrosis of bone: Secondary | ICD-10-CM | POA: Diagnosis not present

## 2021-10-23 DIAGNOSIS — I96 Gangrene, not elsewhere classified: Secondary | ICD-10-CM | POA: Diagnosis not present

## 2021-10-23 DIAGNOSIS — I739 Peripheral vascular disease, unspecified: Secondary | ICD-10-CM | POA: Diagnosis not present

## 2021-10-23 DIAGNOSIS — Z7984 Long term (current) use of oral hypoglycemic drugs: Secondary | ICD-10-CM | POA: Diagnosis not present

## 2021-10-23 DIAGNOSIS — L89151 Pressure ulcer of sacral region, stage 1: Secondary | ICD-10-CM | POA: Diagnosis not present

## 2021-10-23 DIAGNOSIS — E1151 Type 2 diabetes mellitus with diabetic peripheral angiopathy without gangrene: Secondary | ICD-10-CM | POA: Diagnosis not present

## 2021-10-23 DIAGNOSIS — Z792 Long term (current) use of antibiotics: Secondary | ICD-10-CM | POA: Diagnosis not present

## 2021-10-23 DIAGNOSIS — E11621 Type 2 diabetes mellitus with foot ulcer: Secondary | ICD-10-CM | POA: Diagnosis not present

## 2021-10-26 ENCOUNTER — Other Ambulatory Visit: Payer: Self-pay | Admitting: Cardiology

## 2021-10-29 DIAGNOSIS — E11621 Type 2 diabetes mellitus with foot ulcer: Secondary | ICD-10-CM | POA: Diagnosis not present

## 2021-10-29 DIAGNOSIS — I739 Peripheral vascular disease, unspecified: Secondary | ICD-10-CM | POA: Diagnosis not present

## 2021-10-29 DIAGNOSIS — E1151 Type 2 diabetes mellitus with diabetic peripheral angiopathy without gangrene: Secondary | ICD-10-CM | POA: Diagnosis not present

## 2021-10-29 DIAGNOSIS — L97524 Non-pressure chronic ulcer of other part of left foot with necrosis of bone: Secondary | ICD-10-CM | POA: Diagnosis not present

## 2021-10-29 DIAGNOSIS — Z792 Long term (current) use of antibiotics: Secondary | ICD-10-CM | POA: Diagnosis not present

## 2021-10-29 DIAGNOSIS — I96 Gangrene, not elsewhere classified: Secondary | ICD-10-CM | POA: Diagnosis not present

## 2021-11-06 DIAGNOSIS — I96 Gangrene, not elsewhere classified: Secondary | ICD-10-CM | POA: Diagnosis not present

## 2021-11-06 DIAGNOSIS — E1151 Type 2 diabetes mellitus with diabetic peripheral angiopathy without gangrene: Secondary | ICD-10-CM | POA: Diagnosis not present

## 2021-11-06 DIAGNOSIS — L97524 Non-pressure chronic ulcer of other part of left foot with necrosis of bone: Secondary | ICD-10-CM | POA: Diagnosis not present

## 2021-11-06 DIAGNOSIS — Z792 Long term (current) use of antibiotics: Secondary | ICD-10-CM | POA: Diagnosis not present

## 2021-11-06 DIAGNOSIS — I739 Peripheral vascular disease, unspecified: Secondary | ICD-10-CM | POA: Diagnosis not present

## 2021-11-06 DIAGNOSIS — E11621 Type 2 diabetes mellitus with foot ulcer: Secondary | ICD-10-CM | POA: Diagnosis not present

## 2021-11-13 DIAGNOSIS — I96 Gangrene, not elsewhere classified: Secondary | ICD-10-CM | POA: Diagnosis not present

## 2021-11-13 DIAGNOSIS — I739 Peripheral vascular disease, unspecified: Secondary | ICD-10-CM | POA: Diagnosis not present

## 2021-11-13 DIAGNOSIS — E11621 Type 2 diabetes mellitus with foot ulcer: Secondary | ICD-10-CM | POA: Diagnosis not present

## 2021-11-13 DIAGNOSIS — E1151 Type 2 diabetes mellitus with diabetic peripheral angiopathy without gangrene: Secondary | ICD-10-CM | POA: Diagnosis not present

## 2021-11-13 DIAGNOSIS — L97524 Non-pressure chronic ulcer of other part of left foot with necrosis of bone: Secondary | ICD-10-CM | POA: Diagnosis not present

## 2021-11-13 DIAGNOSIS — Z792 Long term (current) use of antibiotics: Secondary | ICD-10-CM | POA: Diagnosis not present

## 2021-11-17 DIAGNOSIS — Z515 Encounter for palliative care: Secondary | ICD-10-CM | POA: Diagnosis not present

## 2021-11-17 DIAGNOSIS — M86272 Subacute osteomyelitis, left ankle and foot: Secondary | ICD-10-CM | POA: Diagnosis not present

## 2021-11-17 DIAGNOSIS — A480 Gas gangrene: Secondary | ICD-10-CM | POA: Diagnosis not present

## 2021-11-17 DIAGNOSIS — I739 Peripheral vascular disease, unspecified: Secondary | ICD-10-CM | POA: Diagnosis not present

## 2021-11-19 DIAGNOSIS — I96 Gangrene, not elsewhere classified: Secondary | ICD-10-CM | POA: Diagnosis not present

## 2021-11-19 DIAGNOSIS — I739 Peripheral vascular disease, unspecified: Secondary | ICD-10-CM | POA: Diagnosis not present

## 2021-11-19 DIAGNOSIS — E11621 Type 2 diabetes mellitus with foot ulcer: Secondary | ICD-10-CM | POA: Diagnosis not present

## 2021-11-19 DIAGNOSIS — E1151 Type 2 diabetes mellitus with diabetic peripheral angiopathy without gangrene: Secondary | ICD-10-CM | POA: Diagnosis not present

## 2021-11-19 DIAGNOSIS — Z792 Long term (current) use of antibiotics: Secondary | ICD-10-CM | POA: Diagnosis not present

## 2021-11-19 DIAGNOSIS — L97524 Non-pressure chronic ulcer of other part of left foot with necrosis of bone: Secondary | ICD-10-CM | POA: Diagnosis not present

## 2021-11-22 DIAGNOSIS — I739 Peripheral vascular disease, unspecified: Secondary | ICD-10-CM | POA: Diagnosis not present

## 2021-11-22 DIAGNOSIS — L97524 Non-pressure chronic ulcer of other part of left foot with necrosis of bone: Secondary | ICD-10-CM | POA: Diagnosis not present

## 2021-11-22 DIAGNOSIS — I96 Gangrene, not elsewhere classified: Secondary | ICD-10-CM | POA: Diagnosis not present

## 2021-11-22 DIAGNOSIS — E1151 Type 2 diabetes mellitus with diabetic peripheral angiopathy without gangrene: Secondary | ICD-10-CM | POA: Diagnosis not present

## 2021-11-22 DIAGNOSIS — Z7984 Long term (current) use of oral hypoglycemic drugs: Secondary | ICD-10-CM | POA: Diagnosis not present

## 2021-11-22 DIAGNOSIS — E11621 Type 2 diabetes mellitus with foot ulcer: Secondary | ICD-10-CM | POA: Diagnosis not present

## 2021-11-27 DIAGNOSIS — I739 Peripheral vascular disease, unspecified: Secondary | ICD-10-CM | POA: Diagnosis not present

## 2021-11-27 DIAGNOSIS — Z7984 Long term (current) use of oral hypoglycemic drugs: Secondary | ICD-10-CM | POA: Diagnosis not present

## 2021-11-27 DIAGNOSIS — I96 Gangrene, not elsewhere classified: Secondary | ICD-10-CM | POA: Diagnosis not present

## 2021-11-27 DIAGNOSIS — E11621 Type 2 diabetes mellitus with foot ulcer: Secondary | ICD-10-CM | POA: Diagnosis not present

## 2021-11-27 DIAGNOSIS — E1151 Type 2 diabetes mellitus with diabetic peripheral angiopathy without gangrene: Secondary | ICD-10-CM | POA: Diagnosis not present

## 2021-11-27 DIAGNOSIS — L97524 Non-pressure chronic ulcer of other part of left foot with necrosis of bone: Secondary | ICD-10-CM | POA: Diagnosis not present

## 2021-12-03 DIAGNOSIS — I96 Gangrene, not elsewhere classified: Secondary | ICD-10-CM | POA: Diagnosis not present

## 2021-12-03 DIAGNOSIS — I739 Peripheral vascular disease, unspecified: Secondary | ICD-10-CM | POA: Diagnosis not present

## 2021-12-03 DIAGNOSIS — E1151 Type 2 diabetes mellitus with diabetic peripheral angiopathy without gangrene: Secondary | ICD-10-CM | POA: Diagnosis not present

## 2021-12-03 DIAGNOSIS — M79672 Pain in left foot: Secondary | ICD-10-CM | POA: Diagnosis not present

## 2021-12-04 DIAGNOSIS — Z7984 Long term (current) use of oral hypoglycemic drugs: Secondary | ICD-10-CM | POA: Diagnosis not present

## 2021-12-04 DIAGNOSIS — I739 Peripheral vascular disease, unspecified: Secondary | ICD-10-CM | POA: Diagnosis not present

## 2021-12-04 DIAGNOSIS — I96 Gangrene, not elsewhere classified: Secondary | ICD-10-CM | POA: Diagnosis not present

## 2021-12-04 DIAGNOSIS — L97524 Non-pressure chronic ulcer of other part of left foot with necrosis of bone: Secondary | ICD-10-CM | POA: Diagnosis not present

## 2021-12-04 DIAGNOSIS — E1151 Type 2 diabetes mellitus with diabetic peripheral angiopathy without gangrene: Secondary | ICD-10-CM | POA: Diagnosis not present

## 2021-12-04 DIAGNOSIS — E11621 Type 2 diabetes mellitus with foot ulcer: Secondary | ICD-10-CM | POA: Diagnosis not present

## 2021-12-11 DIAGNOSIS — Z7984 Long term (current) use of oral hypoglycemic drugs: Secondary | ICD-10-CM | POA: Diagnosis not present

## 2021-12-11 DIAGNOSIS — L97524 Non-pressure chronic ulcer of other part of left foot with necrosis of bone: Secondary | ICD-10-CM | POA: Diagnosis not present

## 2021-12-11 DIAGNOSIS — E1151 Type 2 diabetes mellitus with diabetic peripheral angiopathy without gangrene: Secondary | ICD-10-CM | POA: Diagnosis not present

## 2021-12-11 DIAGNOSIS — I96 Gangrene, not elsewhere classified: Secondary | ICD-10-CM | POA: Diagnosis not present

## 2021-12-11 DIAGNOSIS — I739 Peripheral vascular disease, unspecified: Secondary | ICD-10-CM | POA: Diagnosis not present

## 2021-12-11 DIAGNOSIS — E11621 Type 2 diabetes mellitus with foot ulcer: Secondary | ICD-10-CM | POA: Diagnosis not present

## 2021-12-18 DIAGNOSIS — E11621 Type 2 diabetes mellitus with foot ulcer: Secondary | ICD-10-CM | POA: Diagnosis not present

## 2021-12-18 DIAGNOSIS — I96 Gangrene, not elsewhere classified: Secondary | ICD-10-CM | POA: Diagnosis not present

## 2021-12-18 DIAGNOSIS — L97524 Non-pressure chronic ulcer of other part of left foot with necrosis of bone: Secondary | ICD-10-CM | POA: Diagnosis not present

## 2021-12-18 DIAGNOSIS — Z7984 Long term (current) use of oral hypoglycemic drugs: Secondary | ICD-10-CM | POA: Diagnosis not present

## 2021-12-18 DIAGNOSIS — E1151 Type 2 diabetes mellitus with diabetic peripheral angiopathy without gangrene: Secondary | ICD-10-CM | POA: Diagnosis not present

## 2021-12-18 DIAGNOSIS — I739 Peripheral vascular disease, unspecified: Secondary | ICD-10-CM | POA: Diagnosis not present

## 2021-12-22 DIAGNOSIS — I739 Peripheral vascular disease, unspecified: Secondary | ICD-10-CM | POA: Diagnosis not present

## 2021-12-22 DIAGNOSIS — I779 Disorder of arteries and arterioles, unspecified: Secondary | ICD-10-CM | POA: Diagnosis not present

## 2021-12-22 DIAGNOSIS — I96 Gangrene, not elsewhere classified: Secondary | ICD-10-CM | POA: Diagnosis not present

## 2021-12-22 DIAGNOSIS — R110 Nausea: Secondary | ICD-10-CM | POA: Diagnosis not present

## 2021-12-22 DIAGNOSIS — E785 Hyperlipidemia, unspecified: Secondary | ICD-10-CM | POA: Diagnosis not present

## 2021-12-22 DIAGNOSIS — E039 Hypothyroidism, unspecified: Secondary | ICD-10-CM | POA: Diagnosis not present

## 2021-12-22 DIAGNOSIS — I10 Essential (primary) hypertension: Secondary | ICD-10-CM | POA: Diagnosis not present

## 2021-12-22 DIAGNOSIS — M869 Osteomyelitis, unspecified: Secondary | ICD-10-CM | POA: Diagnosis not present

## 2021-12-22 DIAGNOSIS — R52 Pain, unspecified: Secondary | ICD-10-CM | POA: Diagnosis not present

## 2021-12-22 DIAGNOSIS — R531 Weakness: Secondary | ICD-10-CM | POA: Diagnosis not present

## 2021-12-22 DIAGNOSIS — I480 Paroxysmal atrial fibrillation: Secondary | ICD-10-CM | POA: Diagnosis not present

## 2021-12-23 DIAGNOSIS — I739 Peripheral vascular disease, unspecified: Secondary | ICD-10-CM | POA: Diagnosis not present

## 2021-12-23 DIAGNOSIS — E039 Hypothyroidism, unspecified: Secondary | ICD-10-CM | POA: Diagnosis not present

## 2021-12-23 DIAGNOSIS — I779 Disorder of arteries and arterioles, unspecified: Secondary | ICD-10-CM | POA: Diagnosis not present

## 2021-12-23 DIAGNOSIS — M869 Osteomyelitis, unspecified: Secondary | ICD-10-CM | POA: Diagnosis not present

## 2021-12-23 DIAGNOSIS — I96 Gangrene, not elsewhere classified: Secondary | ICD-10-CM | POA: Diagnosis not present

## 2021-12-23 DIAGNOSIS — I480 Paroxysmal atrial fibrillation: Secondary | ICD-10-CM | POA: Diagnosis not present

## 2021-12-24 DIAGNOSIS — I96 Gangrene, not elsewhere classified: Secondary | ICD-10-CM | POA: Diagnosis not present

## 2021-12-24 DIAGNOSIS — E039 Hypothyroidism, unspecified: Secondary | ICD-10-CM | POA: Diagnosis not present

## 2021-12-24 DIAGNOSIS — I480 Paroxysmal atrial fibrillation: Secondary | ICD-10-CM | POA: Diagnosis not present

## 2021-12-24 DIAGNOSIS — M869 Osteomyelitis, unspecified: Secondary | ICD-10-CM | POA: Diagnosis not present

## 2021-12-24 DIAGNOSIS — I739 Peripheral vascular disease, unspecified: Secondary | ICD-10-CM | POA: Diagnosis not present

## 2021-12-24 DIAGNOSIS — I779 Disorder of arteries and arterioles, unspecified: Secondary | ICD-10-CM | POA: Diagnosis not present

## 2021-12-29 DIAGNOSIS — I480 Paroxysmal atrial fibrillation: Secondary | ICD-10-CM | POA: Diagnosis not present

## 2021-12-29 DIAGNOSIS — I96 Gangrene, not elsewhere classified: Secondary | ICD-10-CM | POA: Diagnosis not present

## 2021-12-29 DIAGNOSIS — M869 Osteomyelitis, unspecified: Secondary | ICD-10-CM | POA: Diagnosis not present

## 2021-12-29 DIAGNOSIS — I739 Peripheral vascular disease, unspecified: Secondary | ICD-10-CM | POA: Diagnosis not present

## 2021-12-29 DIAGNOSIS — I779 Disorder of arteries and arterioles, unspecified: Secondary | ICD-10-CM | POA: Diagnosis not present

## 2021-12-29 DIAGNOSIS — E039 Hypothyroidism, unspecified: Secondary | ICD-10-CM | POA: Diagnosis not present

## 2022-01-04 DIAGNOSIS — I96 Gangrene, not elsewhere classified: Secondary | ICD-10-CM | POA: Diagnosis not present

## 2022-01-04 DIAGNOSIS — I739 Peripheral vascular disease, unspecified: Secondary | ICD-10-CM | POA: Diagnosis not present

## 2022-01-04 DIAGNOSIS — I779 Disorder of arteries and arterioles, unspecified: Secondary | ICD-10-CM | POA: Diagnosis not present

## 2022-01-04 DIAGNOSIS — I480 Paroxysmal atrial fibrillation: Secondary | ICD-10-CM | POA: Diagnosis not present

## 2022-01-04 DIAGNOSIS — M869 Osteomyelitis, unspecified: Secondary | ICD-10-CM | POA: Diagnosis not present

## 2022-01-04 DIAGNOSIS — E039 Hypothyroidism, unspecified: Secondary | ICD-10-CM | POA: Diagnosis not present

## 2022-01-06 DIAGNOSIS — M869 Osteomyelitis, unspecified: Secondary | ICD-10-CM | POA: Diagnosis not present

## 2022-01-06 DIAGNOSIS — I96 Gangrene, not elsewhere classified: Secondary | ICD-10-CM | POA: Diagnosis not present

## 2022-01-06 DIAGNOSIS — I480 Paroxysmal atrial fibrillation: Secondary | ICD-10-CM | POA: Diagnosis not present

## 2022-01-06 DIAGNOSIS — I779 Disorder of arteries and arterioles, unspecified: Secondary | ICD-10-CM | POA: Diagnosis not present

## 2022-01-06 DIAGNOSIS — I739 Peripheral vascular disease, unspecified: Secondary | ICD-10-CM | POA: Diagnosis not present

## 2022-01-06 DIAGNOSIS — E039 Hypothyroidism, unspecified: Secondary | ICD-10-CM | POA: Diagnosis not present

## 2022-01-08 DIAGNOSIS — R52 Pain, unspecified: Secondary | ICD-10-CM | POA: Diagnosis not present

## 2022-01-08 DIAGNOSIS — I96 Gangrene, not elsewhere classified: Secondary | ICD-10-CM | POA: Diagnosis not present

## 2022-01-08 DIAGNOSIS — I779 Disorder of arteries and arterioles, unspecified: Secondary | ICD-10-CM | POA: Diagnosis not present

## 2022-01-08 DIAGNOSIS — R531 Weakness: Secondary | ICD-10-CM | POA: Diagnosis not present

## 2022-01-08 DIAGNOSIS — I739 Peripheral vascular disease, unspecified: Secondary | ICD-10-CM | POA: Diagnosis not present

## 2022-01-08 DIAGNOSIS — I10 Essential (primary) hypertension: Secondary | ICD-10-CM | POA: Diagnosis not present

## 2022-01-08 DIAGNOSIS — R110 Nausea: Secondary | ICD-10-CM | POA: Diagnosis not present

## 2022-01-08 DIAGNOSIS — M869 Osteomyelitis, unspecified: Secondary | ICD-10-CM | POA: Diagnosis not present

## 2022-01-08 DIAGNOSIS — I480 Paroxysmal atrial fibrillation: Secondary | ICD-10-CM | POA: Diagnosis not present

## 2022-01-08 DIAGNOSIS — E039 Hypothyroidism, unspecified: Secondary | ICD-10-CM | POA: Diagnosis not present

## 2022-01-08 DIAGNOSIS — E785 Hyperlipidemia, unspecified: Secondary | ICD-10-CM | POA: Diagnosis not present

## 2022-01-11 ENCOUNTER — Other Ambulatory Visit: Payer: Self-pay | Admitting: *Deleted

## 2022-01-11 DIAGNOSIS — M869 Osteomyelitis, unspecified: Secondary | ICD-10-CM | POA: Diagnosis not present

## 2022-01-11 DIAGNOSIS — I480 Paroxysmal atrial fibrillation: Secondary | ICD-10-CM | POA: Diagnosis not present

## 2022-01-11 DIAGNOSIS — I739 Peripheral vascular disease, unspecified: Secondary | ICD-10-CM | POA: Diagnosis not present

## 2022-01-11 DIAGNOSIS — I779 Disorder of arteries and arterioles, unspecified: Secondary | ICD-10-CM | POA: Diagnosis not present

## 2022-01-11 DIAGNOSIS — E039 Hypothyroidism, unspecified: Secondary | ICD-10-CM | POA: Diagnosis not present

## 2022-01-11 DIAGNOSIS — I96 Gangrene, not elsewhere classified: Secondary | ICD-10-CM | POA: Diagnosis not present

## 2022-01-11 NOTE — Patient Outreach (Signed)
  Care Coordination   01/11/2022  Name: Scott [name] MRN: [id number] DOB: [birth year]   Care Coordination Outreach Attempts:  An unsuccessful telephone outreach was attempted today to offer the patient information about available care coordination services as a benefit of their health plan. HIPAA compliant messages left on voicemail, providing contact information for CSW, encouraging patient to return CSW's call at his earliest convenience.  Follow Up Plan:  Additional outreach attempts will be made to offer the patient care coordination information and services.   Encounter Outcome:  No Answer.   Care Coordination Interventions:  No, not indicated.    Danford Bad, BSW, MSW, LCSW  Licensed Restaurant manager, [employer] [facility]  Mailing [place] [address], [place], [place] [address] Physical Address-[address] [place], [place] [address] Toll Free Main # [phone] Fax # [phone] Cell # [phone] [email or web address]

## 2022-01-14 DIAGNOSIS — E039 Hypothyroidism, unspecified: Secondary | ICD-10-CM | POA: Diagnosis not present

## 2022-01-14 DIAGNOSIS — I779 Disorder of arteries and arterioles, unspecified: Secondary | ICD-10-CM | POA: Diagnosis not present

## 2022-01-14 DIAGNOSIS — I96 Gangrene, not elsewhere classified: Secondary | ICD-10-CM | POA: Diagnosis not present

## 2022-01-14 DIAGNOSIS — M869 Osteomyelitis, unspecified: Secondary | ICD-10-CM | POA: Diagnosis not present

## 2022-01-14 DIAGNOSIS — I739 Peripheral vascular disease, unspecified: Secondary | ICD-10-CM | POA: Diagnosis not present

## 2022-01-14 DIAGNOSIS — I480 Paroxysmal atrial fibrillation: Secondary | ICD-10-CM | POA: Diagnosis not present

## 2022-01-18 DIAGNOSIS — I779 Disorder of arteries and arterioles, unspecified: Secondary | ICD-10-CM | POA: Diagnosis not present

## 2022-01-18 DIAGNOSIS — I739 Peripheral vascular disease, unspecified: Secondary | ICD-10-CM | POA: Diagnosis not present

## 2022-01-18 DIAGNOSIS — I96 Gangrene, not elsewhere classified: Secondary | ICD-10-CM | POA: Diagnosis not present

## 2022-01-18 DIAGNOSIS — M869 Osteomyelitis, unspecified: Secondary | ICD-10-CM | POA: Diagnosis not present

## 2022-01-18 DIAGNOSIS — I480 Paroxysmal atrial fibrillation: Secondary | ICD-10-CM | POA: Diagnosis not present

## 2022-01-18 DIAGNOSIS — E039 Hypothyroidism, unspecified: Secondary | ICD-10-CM | POA: Diagnosis not present

## 2022-01-21 DIAGNOSIS — I96 Gangrene, not elsewhere classified: Secondary | ICD-10-CM | POA: Diagnosis not present

## 2022-01-21 DIAGNOSIS — E039 Hypothyroidism, unspecified: Secondary | ICD-10-CM | POA: Diagnosis not present

## 2022-01-21 DIAGNOSIS — I739 Peripheral vascular disease, unspecified: Secondary | ICD-10-CM | POA: Diagnosis not present

## 2022-01-21 DIAGNOSIS — I480 Paroxysmal atrial fibrillation: Secondary | ICD-10-CM | POA: Diagnosis not present

## 2022-01-21 DIAGNOSIS — M869 Osteomyelitis, unspecified: Secondary | ICD-10-CM | POA: Diagnosis not present

## 2022-01-21 DIAGNOSIS — I779 Disorder of arteries and arterioles, unspecified: Secondary | ICD-10-CM | POA: Diagnosis not present

## 2022-01-25 ENCOUNTER — Other Ambulatory Visit: Payer: Self-pay | Admitting: *Deleted

## 2022-01-25 DIAGNOSIS — M869 Osteomyelitis, unspecified: Secondary | ICD-10-CM | POA: Diagnosis not present

## 2022-01-25 DIAGNOSIS — I779 Disorder of arteries and arterioles, unspecified: Secondary | ICD-10-CM | POA: Diagnosis not present

## 2022-01-25 DIAGNOSIS — I96 Gangrene, not elsewhere classified: Secondary | ICD-10-CM | POA: Diagnosis not present

## 2022-01-25 DIAGNOSIS — E039 Hypothyroidism, unspecified: Secondary | ICD-10-CM | POA: Diagnosis not present

## 2022-01-25 DIAGNOSIS — I480 Paroxysmal atrial fibrillation: Secondary | ICD-10-CM | POA: Diagnosis not present

## 2022-01-25 DIAGNOSIS — I739 Peripheral vascular disease, unspecified: Secondary | ICD-10-CM | POA: Diagnosis not present

## 2022-01-25 NOTE — Patient Outreach (Signed)
  Care Coordination   01/25/2022  Name: TERALD JUMP MRN: [id number] DOB: [birth year]   Care Coordination Outreach Attempts:  A second unsuccessful outreach was attempted today to offer the patient with information about available care coordination services as a benefit of their health plan.   HIPAA compliant messages left on voicemail, providing contact information for CSW, encouraging patient to return CSW's call at his earliest convenience.  Follow Up Plan:  Additional outreach attempts will be made to offer the patient care coordination information and services.   Encounter Outcome:  No Answer.   Care Coordination Interventions:  No, not indicated.    Danford Bad, BSW, MSW, LCSW  Licensed Restaurant manager, [employer] [facility]  Mailing [place] [address] [place], [place] [address] Physical Address[address], [place], [place] [address] Toll Free Main # [phone] Fax # [phone] Cell # [phone] [email or web address]

## 2022-01-27 ENCOUNTER — Other Ambulatory Visit: Payer: Self-pay | Admitting: *Deleted

## 2022-01-27 DIAGNOSIS — E039 Hypothyroidism, unspecified: Secondary | ICD-10-CM | POA: Diagnosis not present

## 2022-01-27 DIAGNOSIS — I739 Peripheral vascular disease, unspecified: Secondary | ICD-10-CM | POA: Diagnosis not present

## 2022-01-27 DIAGNOSIS — I779 Disorder of arteries and arterioles, unspecified: Secondary | ICD-10-CM | POA: Diagnosis not present

## 2022-01-27 DIAGNOSIS — I480 Paroxysmal atrial fibrillation: Secondary | ICD-10-CM | POA: Diagnosis not present

## 2022-01-27 DIAGNOSIS — M869 Osteomyelitis, unspecified: Secondary | ICD-10-CM | POA: Diagnosis not present

## 2022-01-27 DIAGNOSIS — I96 Gangrene, not elsewhere classified: Secondary | ICD-10-CM | POA: Diagnosis not present

## 2022-01-27 NOTE — Patient Outreach (Signed)
  Care Coordination   01/27/2022  Name: Scott [name] MRN: [id number] DOB: 1929-06-08   Care Coordination Outreach Attempts:  A third unsuccessful outreach was attempted today to offer the patient with information about available care coordination services as a benefit of their health plan. HIPAA compliant messages left on voicemail, providing contact information for CSW, encouraging patient to return CSW's call at his earliest convenience.  Follow Up Plan:  No further outreach attempts will be made at this time. We have been unable to contact the patient to offer or enroll patient in care coordination services.  Encounter Outcome:  No Answer.   Care Coordination Interventions:  No, not indicated.    Danford Bad, BSW, MSW, LCSW  Licensed Restaurant manager, [employer] [facility]  Mailing [place] [address], [place], [place] [address] Physical Address[address], [place], [place] [address] Toll Free Main # [phone] Fax # [phone] Cell # [phone] [email or web address]

## 2022-01-28 DIAGNOSIS — I739 Peripheral vascular disease, unspecified: Secondary | ICD-10-CM | POA: Diagnosis not present

## 2022-01-28 DIAGNOSIS — E039 Hypothyroidism, unspecified: Secondary | ICD-10-CM | POA: Diagnosis not present

## 2022-01-28 DIAGNOSIS — I779 Disorder of arteries and arterioles, unspecified: Secondary | ICD-10-CM | POA: Diagnosis not present

## 2022-01-28 DIAGNOSIS — I480 Paroxysmal atrial fibrillation: Secondary | ICD-10-CM | POA: Diagnosis not present

## 2022-01-28 DIAGNOSIS — I96 Gangrene, not elsewhere classified: Secondary | ICD-10-CM | POA: Diagnosis not present

## 2022-01-28 DIAGNOSIS — M869 Osteomyelitis, unspecified: Secondary | ICD-10-CM | POA: Diagnosis not present

## 2022-01-29 DIAGNOSIS — E039 Hypothyroidism, unspecified: Secondary | ICD-10-CM | POA: Diagnosis not present

## 2022-01-29 DIAGNOSIS — I779 Disorder of arteries and arterioles, unspecified: Secondary | ICD-10-CM | POA: Diagnosis not present

## 2022-01-29 DIAGNOSIS — I739 Peripheral vascular disease, unspecified: Secondary | ICD-10-CM | POA: Diagnosis not present

## 2022-01-29 DIAGNOSIS — I96 Gangrene, not elsewhere classified: Secondary | ICD-10-CM | POA: Diagnosis not present

## 2022-01-29 DIAGNOSIS — M869 Osteomyelitis, unspecified: Secondary | ICD-10-CM | POA: Diagnosis not present

## 2022-01-29 DIAGNOSIS — I480 Paroxysmal atrial fibrillation: Secondary | ICD-10-CM | POA: Diagnosis not present

## 2022-02-02 DIAGNOSIS — I96 Gangrene, not elsewhere classified: Secondary | ICD-10-CM | POA: Diagnosis not present

## 2022-02-02 DIAGNOSIS — E039 Hypothyroidism, unspecified: Secondary | ICD-10-CM | POA: Diagnosis not present

## 2022-02-02 DIAGNOSIS — M869 Osteomyelitis, unspecified: Secondary | ICD-10-CM | POA: Diagnosis not present

## 2022-02-02 DIAGNOSIS — I739 Peripheral vascular disease, unspecified: Secondary | ICD-10-CM | POA: Diagnosis not present

## 2022-02-02 DIAGNOSIS — I779 Disorder of arteries and arterioles, unspecified: Secondary | ICD-10-CM | POA: Diagnosis not present

## 2022-02-02 DIAGNOSIS — I480 Paroxysmal atrial fibrillation: Secondary | ICD-10-CM | POA: Diagnosis not present

## 2022-02-05 DIAGNOSIS — E039 Hypothyroidism, unspecified: Secondary | ICD-10-CM | POA: Diagnosis not present

## 2022-02-05 DIAGNOSIS — I480 Paroxysmal atrial fibrillation: Secondary | ICD-10-CM | POA: Diagnosis not present

## 2022-02-05 DIAGNOSIS — I779 Disorder of arteries and arterioles, unspecified: Secondary | ICD-10-CM | POA: Diagnosis not present

## 2022-02-05 DIAGNOSIS — I739 Peripheral vascular disease, unspecified: Secondary | ICD-10-CM | POA: Diagnosis not present

## 2022-02-05 DIAGNOSIS — M869 Osteomyelitis, unspecified: Secondary | ICD-10-CM | POA: Diagnosis not present

## 2022-02-05 DIAGNOSIS — I96 Gangrene, not elsewhere classified: Secondary | ICD-10-CM | POA: Diagnosis not present

## 2022-04-23 ENCOUNTER — Ambulatory Visit: Payer: Medicare Other | Admitting: Cardiology

## 2022-04-28 ENCOUNTER — Telehealth: Payer: Medicare Other | Admitting: Cardiology

## 2022-06-03 ENCOUNTER — Ambulatory Visit: Payer: Medicare Other | Admitting: Cardiology

## 2022-06-03 NOTE — Progress Notes (Deleted)
{Choose 1 Note Type (Video or Telephone):[phone]}    Date:  06/03/2022   ID:  Scott [name], DOB 05/08/1929, MRN [id number] The patient was identified using 2 identifiers.  {Patient Location:[phone]::"Home"} {Provider Location:[phone]::"Home Office"}   PCP:  Assunta Found, MD   [facility] Providers Cardiologist:  Dina Rich, MD { Click to update primary MD,subspecialty MD or APP then REFRESH:1}    Evaluation Performed:  {Choose Visit [phone]::"Follow-Up Visit"}  Chief Complaint:  ***  History of Present Illness:    Scott [name] is a 89 y.o. male with ***  seen today for follow up of the following medical problems.    1. CAD - prior CABG in 2010 with a LIMA to the LAD, SVG to the OM, SVG to the diagonal, and SVG to the RCA - no chest pain. No SOB/DOE - compliant with meds   2. HTN - compliant with meds - some dizziness at times.      3. Hyperlipidemia - labs followed by pcp  - he is on atorvastatin   4. Carotid stenosis - LICA 100% occlusion -Carotid Dopplers on 09/20/2018 showed chronic occlusion of the left internal carotid artery with mild, 1 to 49% stenosis of the proximal right internal carotid artery.  - no recent neuro symptoms   5. Renal artery stenosis - has had prior left renal artery stent     6. Aflutter - denies any palpitatoins - no bleeding on xarelto.   - 07/2021 admission Hgb down to 5.9, xarelto was stopped   7. PAD - prior right popliteal intervention - no recent claudication pains  - left leg critical ischemia and osteo/dry gangrene, he turned down amputation Past Medical History:  Diagnosis Date   Chronic anticoagulation    Chronic renal insufficiency, stage III (moderate) (HCC)    Coronary artery disease 2010   CABG   Dyslipidemia    Hypertension 12/26/09   renal doppler   Hypothyroidism    PAF (paroxysmal atrial fibrillation) (HCC)    Peripheral vascular disease (HCC) 08/01/10   Carotid  doppler: 12/26/09 lower arterial doppler   RBBB    Shingles April 2012   Past Surgical History:  Procedure Laterality Date   ABDOMINAL AORTOGRAM W/LOWER EXTREMITY N/A 06/11/2021   Procedure: ABDOMINAL AORTOGRAM W/LOWER EXTREMITY;  Surgeon: Cephus Shelling, MD;  Location: [facility];  Service: Cardiovascular;  Laterality: N/A;   CARDIAC CATHETERIZATION  6/10   CORONARY ARTERY BYPASS GRAFT  2010   3-vessel by-pass   HERNIA REPAIR  3/09   POPLITEAL ARTERY ANGIOPLASTY Right    PROSTATE SURGERY  11/99   RENAL ARTERY ANGIOPLASTY Left 9/11     No outpatient medications have been marked as taking for the 06/03/22 encounter (Appointment) with Scott Poche, MD.     Allergies:   Patient has no known allergies.   Social History   Tobacco Use   Smoking status: Former    Packs/day: 1    Types: Cigarettes    Quit date: 07/28/2008    Years since quitting: 13.8   Smokeless tobacco: Never  Vaping Use   Vaping Use: Never used  Substance Use Topics   Alcohol use: No    Alcohol/week: 0.0 standard drinks of alcohol   Drug use: Never     Family Hx: The patient's family history includes CAD in his brother; CVA (age of onset: 9) in his mother; Diabetes in his father; Stroke in his mother.  ROS:   Please see the history  of present illness.    *** All other systems reviewed and are negative.   Prior CV studies:   The following studies were reviewed today:  09/2018 carotid US IMPRESSION: 1. Chronic occlusion of the left internal carotid artery. 2. Mild (1-49%) stenosis proximal right internal carotid artery secondary to mild heterogeneous atherosclerotic plaque. 3. Nonvisualization of the right vertebral artery which may be chronically occluded or congenitally hypoplastic. 4. Patent dominant left vertebral artery with normal antegrade flow.     05/2012 echo Study Conclusions   - Left ventricle: The cavity size was normal. There was mild    focal basal hypertrophy of  the septum. Systolic function    was normal. The estimated ejection fraction was in the    range of 55% to 60%. Doppler parameters are consistent    with abnormal left ventricular relaxation (grade 1    diastolic dysfunction). The E/e' ratio is <10, suggesting    normal LV filling pressure.  - Aortic valve: Sclerosis without stenosis. Trivial    regurgitation.  - Mitral valve: Mildly thickened anterior mitral leaflet    with late systolic prolapse. Mild regurgitation. Valve    area by pressure half-time: 1.65cm^2.  - Left atrium: LA Volume/ BSA = 29.7 ml/m2 The atrium was    mildly dilated.  - Atrial septum: No defect or patent foramen ovale was    identified.  - Pulmonary arteries: PA peak pressure: 34mm Hg (S).  - Inferior vena cava: The vessel was normal in size; the    respirophasic diameter changes were in the normal range (=    50%); findings are consistent with normal central venous    pressure.   Labs/Other Tests and Data Reviewed:    EKG:  {EKG/Telemetry Strips Reviewed:[phone]}  Recent Labs: 07/23/2021: Magnesium 1.5 07/24/2021: BUN 28; Creatinine, Ser 1.66; Hemoglobin 5.9; Platelets 194; Potassium 4.2; Sodium 134   Recent Lipid Panel Lab Results  Component Value Date/Time   CHOL 85 (L) 08/02/2016 08:40 AM   TRIG 63 08/02/2016 08:40 AM   HDL 37 (L) 08/02/2016 08:40 AM   CHOLHDL 2.3 08/02/2016 08:40 AM   CHOLHDL 2.2 08/08/2013 09:09 AM   LDLCALC 35 08/02/2016 08:40 AM    Wt Readings from Last 3 Encounters:  07/23/21 148 lb (67.1 kg)  06/15/21 146 lb 6.4 oz (66.4 kg)  06/11/21 149 lb (67.6 kg)     Risk Assessment/Calculations:   {Does this patient have ATRIAL FIBRILLATION?:[phone]}      Objective:    Vital Signs:  There were no vitals taken for this visit.   {[facility] Virtual Exam (Optional):[phone]::"VITAL SIGNS:  reviewed"}  ASSESSMENT & PLAN:    1. CAD - denies symptoms, continue current meds   2. HTN - soft bp's, some dizziness at  times. We will d/c his norvasc   3. Hyperlipdiemia - continue atorvastatin, request pcp labs   4. Aflutter -- denies symptoms - EKG today shows aflutter in 90s. On high dose lopressor, continue at this time - continue xarlelto      {Are you ordering a CV Procedure (e.g. stress test, cath, DCCV, TEE, etc)?   Press F2        :[id number]}     Time:   Today, I have spent *** minutes with the patient with telehealth technology discussing the above problems.     Medication Adjustments/Labs and Tests Ordered: Current medicines are reviewed at length with the patient today.  Concerns regarding medicines are outlined above.   Tests Ordered: No  orders of the defined types were placed in this encounter.   Medication Changes: No orders of the defined types were placed in this encounter.   Follow Up:  {F/U Format:[phone]} {follow up:15908}  Signed, Dina Rich, MD  06/03/2022 8:10 AM    [facility]

## 2022-08-09 DEATH — deceased

## 2022-11-17 ENCOUNTER — Telehealth: Payer: Medicare Other | Admitting: Cardiology

## 2023-12-30 IMAGING — DX DG FOOT COMPLETE 3+V*L*
3 series · 3 of 3 positions shown · non-contrast
Comparison: None.

CLINICAL DATA: Second toe wound for the past month.

EXAM:
LEFT FOOT - COMPLETE 3+ VIEW

[foot ap]
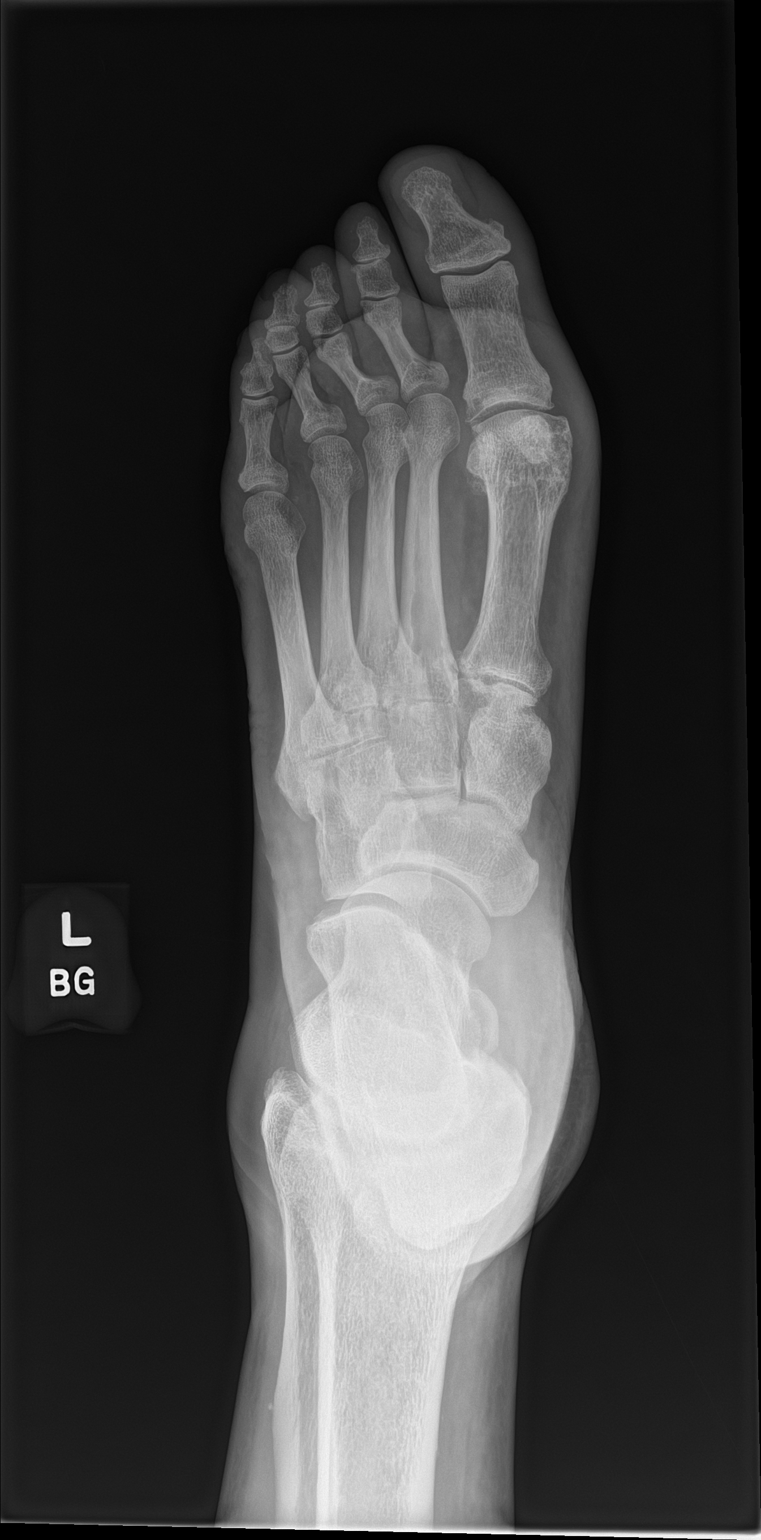

[foot obl]
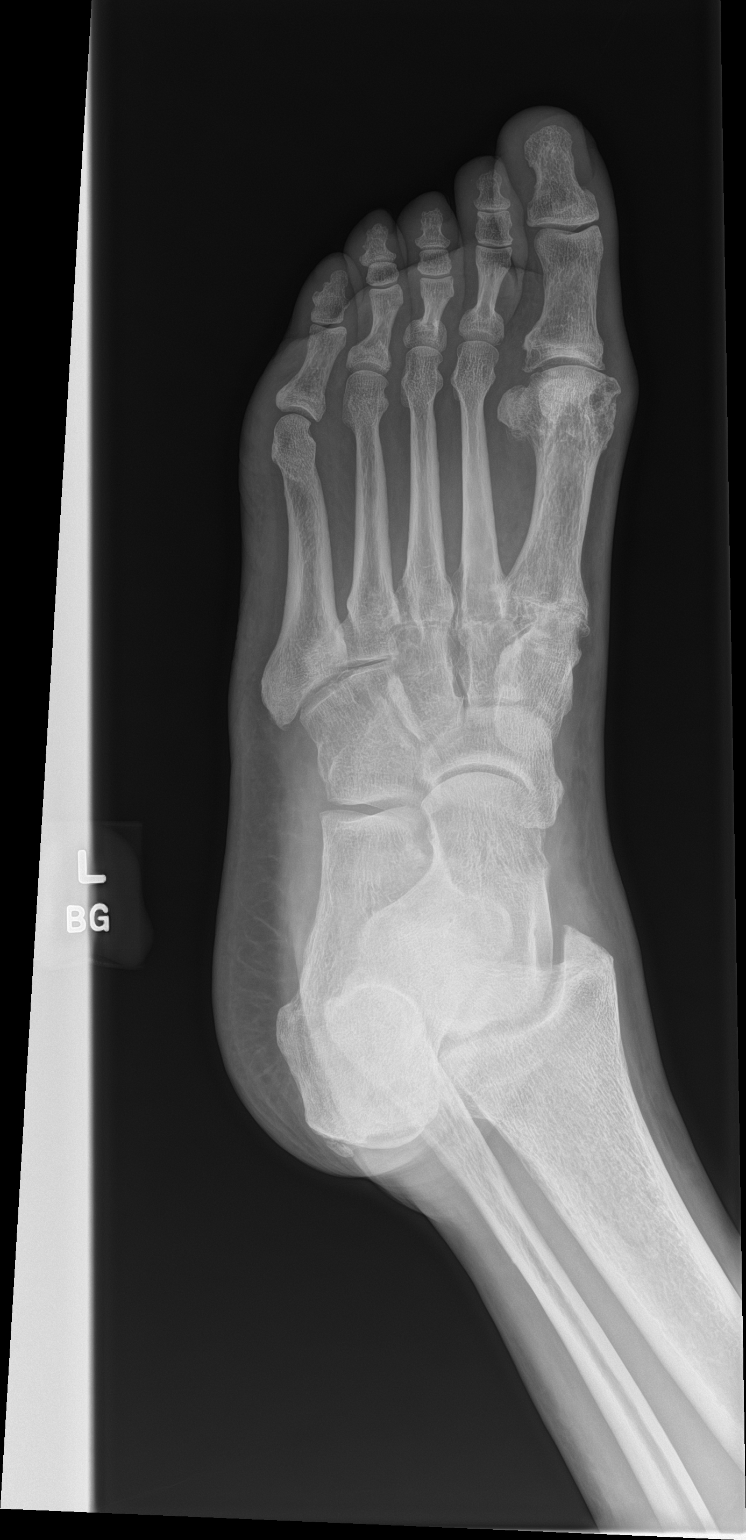

[foot lat]
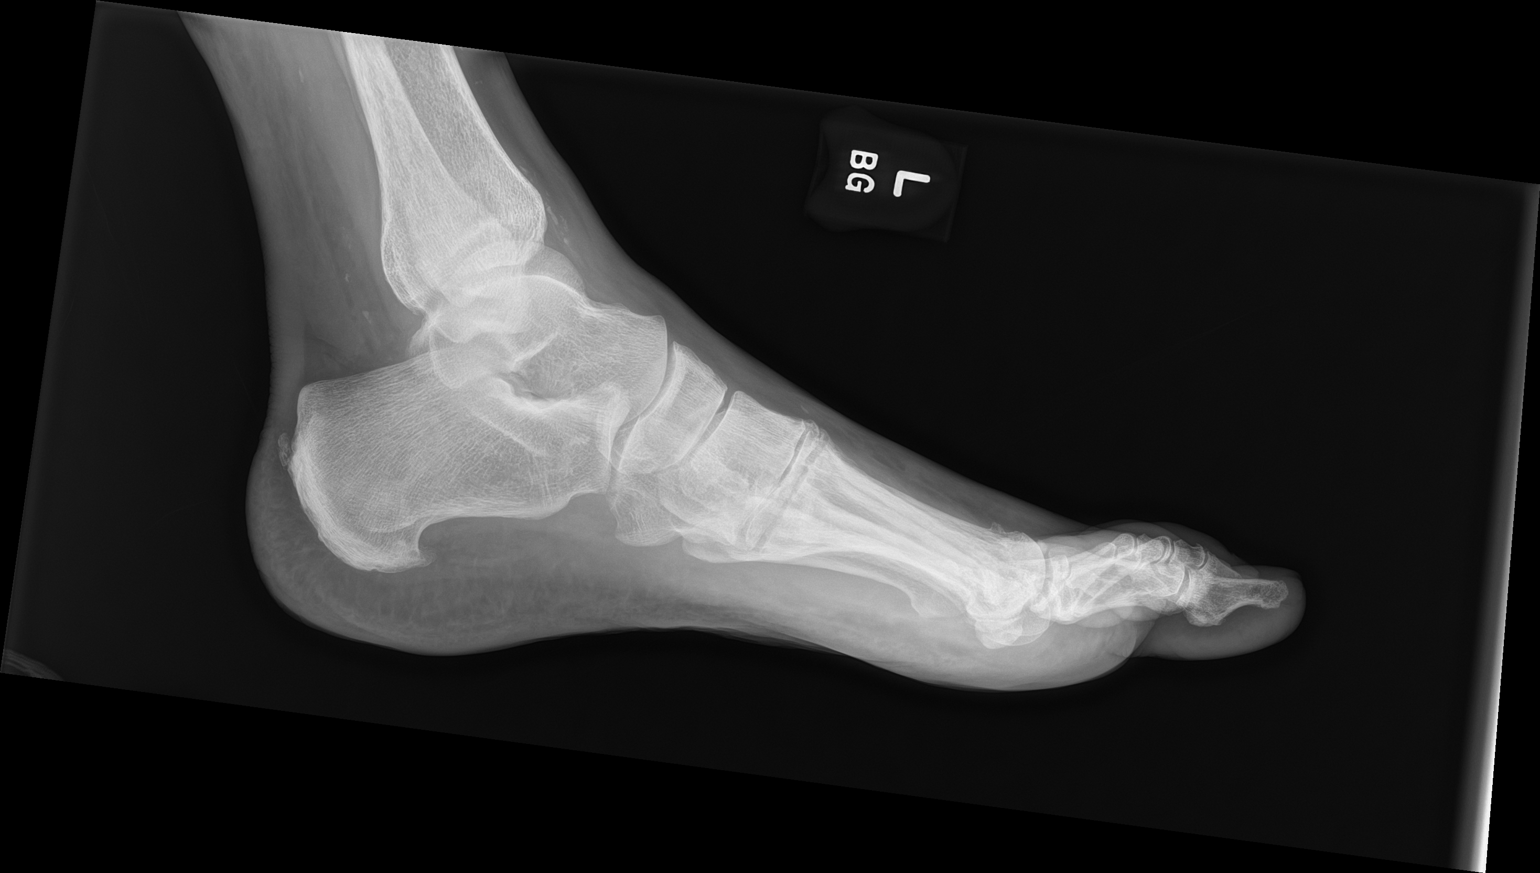

[3 of 3 positions shown; findings below may reference images not displayed]

FINDINGS: No acute fracture or dislocation. No bony destruction or periosteal
reaction. Mild osteoarthritis of the first TMT and first MTP joints.
Bone mineralization is normal. Soft tissues are unremarkable.
IMPRESSION: 1. No acute osseous abnormality.
